# Patient Record
Sex: Female | Born: 1975 | Race: Black or African American | Hispanic: No | Smoking: Former smoker
Health system: Southern US, Community
[De-identification: ages and names within clinical notes are randomized; demographics above are authoritative.]

## PROBLEM LIST (undated history)

## (undated) ENCOUNTER — Inpatient Hospital Stay (HOSPITAL_COMMUNITY): Payer: Self-pay

## (undated) DIAGNOSIS — K219 Gastro-esophageal reflux disease without esophagitis: Secondary | ICD-10-CM

## (undated) DIAGNOSIS — O139 Gestational [pregnancy-induced] hypertension without significant proteinuria, unspecified trimester: Secondary | ICD-10-CM

## (undated) DIAGNOSIS — F329 Major depressive disorder, single episode, unspecified: Secondary | ICD-10-CM

## (undated) DIAGNOSIS — I10 Essential (primary) hypertension: Secondary | ICD-10-CM

## (undated) DIAGNOSIS — L309 Dermatitis, unspecified: Secondary | ICD-10-CM

## (undated) DIAGNOSIS — E119 Type 2 diabetes mellitus without complications: Secondary | ICD-10-CM

## (undated) DIAGNOSIS — F32A Depression, unspecified: Secondary | ICD-10-CM

## (undated) HISTORY — PX: NO PAST SURGERIES: SHX2092

---

## 2002-04-22 ENCOUNTER — Ambulatory Visit (HOSPITAL_COMMUNITY): Admission: RE | Admit: 2002-04-22 | Discharge: 2002-04-22 | Payer: Self-pay | Admitting: *Deleted

## 2002-07-26 ENCOUNTER — Inpatient Hospital Stay (HOSPITAL_COMMUNITY): Admission: AD | Admit: 2002-07-26 | Discharge: 2002-07-26 | Payer: Self-pay | Admitting: Obstetrics and Gynecology

## 2002-07-28 ENCOUNTER — Inpatient Hospital Stay (HOSPITAL_COMMUNITY): Admission: AD | Admit: 2002-07-28 | Discharge: 2002-07-31 | Payer: Self-pay | Admitting: *Deleted

## 2005-05-29 ENCOUNTER — Ambulatory Visit (HOSPITAL_COMMUNITY): Admission: RE | Admit: 2005-05-29 | Discharge: 2005-05-29 | Payer: Self-pay | Admitting: Obstetrics

## 2005-08-14 ENCOUNTER — Ambulatory Visit (HOSPITAL_COMMUNITY): Admission: RE | Admit: 2005-08-14 | Discharge: 2005-08-14 | Payer: Self-pay | Admitting: Obstetrics

## 2005-09-27 ENCOUNTER — Inpatient Hospital Stay (HOSPITAL_COMMUNITY): Admission: AD | Admit: 2005-09-27 | Discharge: 2005-09-27 | Payer: Self-pay | Admitting: Obstetrics

## 2005-10-01 ENCOUNTER — Inpatient Hospital Stay (HOSPITAL_COMMUNITY): Admission: AD | Admit: 2005-10-01 | Discharge: 2005-10-01 | Payer: Self-pay | Admitting: Obstetrics

## 2005-10-12 ENCOUNTER — Inpatient Hospital Stay (HOSPITAL_COMMUNITY): Admission: AD | Admit: 2005-10-12 | Discharge: 2005-10-14 | Payer: Self-pay | Admitting: Obstetrics & Gynecology

## 2005-10-16 ENCOUNTER — Inpatient Hospital Stay (HOSPITAL_COMMUNITY): Admission: AD | Admit: 2005-10-16 | Discharge: 2005-10-16 | Payer: Self-pay | Admitting: Obstetrics

## 2005-11-15 ENCOUNTER — Ambulatory Visit: Admission: RE | Admit: 2005-11-15 | Discharge: 2005-11-15 | Payer: Self-pay | Admitting: Obstetrics

## 2005-11-17 ENCOUNTER — Inpatient Hospital Stay (HOSPITAL_COMMUNITY): Admission: AD | Admit: 2005-11-17 | Discharge: 2005-11-17 | Payer: Self-pay | Admitting: Obstetrics

## 2006-12-23 ENCOUNTER — Emergency Department (HOSPITAL_COMMUNITY): Admission: EM | Admit: 2006-12-23 | Discharge: 2006-12-23 | Payer: Self-pay | Admitting: Emergency Medicine

## 2008-10-10 ENCOUNTER — Inpatient Hospital Stay (HOSPITAL_COMMUNITY): Admission: AD | Admit: 2008-10-10 | Discharge: 2008-10-10 | Payer: Self-pay | Admitting: Family Medicine

## 2010-09-11 LAB — URINALYSIS, ROUTINE W REFLEX MICROSCOPIC
Bilirubin Urine: NEGATIVE
Glucose, UA: NEGATIVE mg/dL
Hgb urine dipstick: NEGATIVE
Ketones, ur: NEGATIVE mg/dL
Nitrite: NEGATIVE
Protein, ur: NEGATIVE mg/dL
Specific Gravity, Urine: 1.015 (ref 1.005–1.030)
Urobilinogen, UA: 1 mg/dL (ref 0.0–1.0)
pH: 8 (ref 5.0–8.0)

## 2010-09-11 LAB — CBC
HCT: 38 % (ref 36.0–46.0)
Hemoglobin: 12.3 g/dL (ref 12.0–15.0)
RBC: 4.81 MIL/uL (ref 3.87–5.11)

## 2010-09-11 LAB — WET PREP, GENITAL
Clue Cells Wet Prep HPF POC: NONE SEEN
Trich, Wet Prep: NONE SEEN
Yeast Wet Prep HPF POC: NONE SEEN

## 2010-09-11 LAB — DIFFERENTIAL
Eosinophils Relative: 1 % (ref 0–5)
Lymphocytes Relative: 31 % (ref 12–46)
Lymphs Abs: 3 10*3/uL (ref 0.7–4.0)
Monocytes Absolute: 0.3 10*3/uL (ref 0.1–1.0)
Monocytes Relative: 3 % (ref 3–12)
Neutro Abs: 6.4 10*3/uL (ref 1.7–7.7)

## 2010-10-19 NOTE — H&P (Signed)
Whitney White, Whitney White                 ACCOUNT NO.:  [id number]   MEDICAL RECORD NO.:  [id number]          PATIENT TYPE:  INP   LOCATION:  9168                          FACILITY:  [facility]   PHYSICIAN:  Roseanna Rainbow, M.D.DATE OF BIRTH:  [birth year]   DATE OF ADMISSION:  10/12/2005  DATE OF DISCHARGE:                                HISTORY & PHYSICAL   CHIEF COMPLAINT:  The patient is a 35 year old para 2 with an intrauterine  pregnancy at 38-5/7 weeks, complaining of ruptured membranes, clear fluids  several hours prior to presentation.   HISTORY OF PRESENT ILLNESS:  Please see the above.   ALLERGIES:  No known drug allergies.   MEDICATIONS:  Prevacid, loratadine.   RISK FACTORS:  None.   LABORATORY DATA:  Blood type A positive, antibody screen negative.  RPR  nonreactive, rubella immune, hepatitis B surface antigen negative, GBS  negative.  HIV nonreactive.  Quad screen normal.  GC and Chlamydia negative.  1-hour GCT 128.  Elevated HSV 1 titers, HSV 2 titers negative, platelet  count 319,000.  Hemoglobin 11.2, hematocrit 35.7, sickle cell negative.   PAST OB/GYN HISTORY:  She is status post 2 spontaneous vaginal deliveries.   PAST MEDICAL HISTORY:  1.  Iron deficiency anemia.  2.  Asthma.  3.  Tension headaches.   PAST SURGICAL HISTORY:  No previous surgery.   SOCIAL HISTORY:  Homemaker.  She is divorced, living with a significant  other, does not give any significant history of alcohol usage, has no  significant smoking history, denies illicit drug use.   FAMILY HISTORY:  Hypertension.   PHYSICAL EXAMINATION:  VITAL SIGNS:  Stable, afebrile.  GENERAL:  Well-developed, well-nourished African-American female in no  apparent distress.  STERILE SPECULUM EXAM:  There is pooling, fern and Nitrazine positive.  STERILE VAGINAL EXAM PER THE RN:  Cervix is 3 and 50% effaced.  Fetal heart  tones reassuring, tocodynamometer irregular uterine contractions.   ASSESSMENT:  1.   Multiparous with intrauterine pregnancy at term.  2.  Prodromal labor.  3.  Fetal heart tracing consistent with fetal well-being.   PLAN:  1.  Admission.  2.  Low dose Pitocin per protocol.      Roseanna Rainbow, M.D.  Electronically Signed     LAJ/MEDQ  D:  10/12/2005  T:  10/12/2005  Job:  [id number]

## 2011-03-07 ENCOUNTER — Other Ambulatory Visit: Payer: Self-pay

## 2011-03-07 ENCOUNTER — Inpatient Hospital Stay (HOSPITAL_COMMUNITY): Payer: Medicaid Other

## 2011-03-07 ENCOUNTER — Encounter (HOSPITAL_COMMUNITY): Payer: Self-pay

## 2011-03-07 ENCOUNTER — Inpatient Hospital Stay (HOSPITAL_COMMUNITY)
Admission: AD | Admit: 2011-03-07 | Discharge: 2011-03-07 | Disposition: A | Discharge status: Home / Self Care | Payer: Medicaid Other | Source: Ambulatory Visit | Attending: Obstetrics & Gynecology | Admitting: Obstetrics & Gynecology

## 2011-03-07 DIAGNOSIS — R079 Chest pain, unspecified: Secondary | ICD-10-CM

## 2011-03-07 DIAGNOSIS — Z331 Pregnant state, incidental: Secondary | ICD-10-CM

## 2011-03-07 DIAGNOSIS — O99891 Other specified diseases and conditions complicating pregnancy: Secondary | ICD-10-CM | POA: Insufficient documentation

## 2011-03-07 LAB — URINE MICROSCOPIC-ADD ON

## 2011-03-07 LAB — URINALYSIS, ROUTINE W REFLEX MICROSCOPIC
Glucose, UA: NEGATIVE mg/dL
Ketones, ur: NEGATIVE mg/dL
Leukocytes, UA: NEGATIVE
Nitrite: NEGATIVE
Protein, ur: NEGATIVE mg/dL
Urobilinogen, UA: 0.2 mg/dL (ref 0.0–1.0)

## 2011-03-07 MED ORDER — IOHEXOL 300 MG/ML  SOLN
100.0000 mL | Freq: Once | INTRAMUSCULAR | Status: AC | PRN
  Administered 2011-03-07: 100 mL via INTRAVENOUS

## 2011-03-07 NOTE — Progress Notes (Signed)
Pt states she has had upper left chest pain on and off for a couple of weeks during the day. Started to be constant 10-3 and continues today.

## 2011-03-07 NOTE — ED Provider Notes (Signed)
History     Chief Complaint  Patient presents with  . Chest Pain   HPIShanni V Blue35 y.o.presents with a 10 day history of upper left chest/shoulder pain. She is now 92w2days pregnant.  G4 P3.Has copy of +UPT at [facility] done 02/07/11.  lans care with Dr. Clearance Coots. States this pregnancy is "more difficult"  Than her other 3.  Discomfort has been  off and on until yesterday when it became more constant.  States pain is worse after getting her 3 children to bus stop.  This makes her short of breath.  She denies difficulty breathing, denies dx of asthma.  Has hx of heartburn but this does not feel like it.  "I feel like I am under water".  Used her boyfriend's inhaler which helped.  States her son is "developing" asthma and she has hx of seasonal allergies.  Denies vaginal bleeding/discharge and abdominal pain.     No past medical history on file.  No past surgical history on file.  No family history on file.  History  Substance Use Topics  . Smoking status: Not on file  . Smokeless tobacco: Not on file  . Alcohol Use: Not on file    Allergies: No Known Allergies  Prescriptions prior to admission  Medication Sig Dispense Refill  . prenatal vitamin w/FE, FA (PRENATAL 1 + 1) 27-1 MG TABS Take 1 tablet by mouth daily.          Review of Systems  Constitutional: Positive for chills (at night).  Respiratory: Positive for shortness of breath. Negative for cough and wheezing.   Gastrointestinal: Positive for heartburn, nausea and vomiting. Negative for abdominal pain.  Genitourinary: Positive for frequency. Negative for dysuria, urgency and hematuria.   Physical Exam   Blood pressure 121/83, pulse 91, temperature 99 F (37.2 C), temperature source Oral, resp. rate 20, height 5\' 2"  (1.575 m), weight 230 lb 3.2 oz (104.418 kg), last menstrual period 12/25/2010, SpO2 99.00%.  Physical Exam  Constitutional: She is oriented to person, place, and time. She appears well-developed and  well-nourished. No distress.  Respiratory: Effort normal and breath sounds normal. No respiratory distress. She has no wheezes. She has no rales. She exhibits no tenderness.  Neurological: She is alert and oriented to person, place, and time.  Skin: Skin is warm and dry.    Status:  Final result                     Study Result     *RADIOLOGY REPORT*  Clinical Data: 10-week pregnant obese female with left chest pain  shortness of breath  CT ANGIOGRAPHY CHEST WITH CONTRAST  Technique: Multidetector CT imaging of the chest was performed  using the standard protocol during bolus administration of  intravenous contrast. Multiplanar CT image reconstructions  including MIPs were obtained to evaluate the vascular anatomy.  Contrast: 100 ml Omnipaque-300  Comparison: None available  Findings: Central proximal hilar pulmonary arteries are well  visualized. No significant filling defect or pulmonary embolus  identified by CTA. Residual thymic tissue in the anterior  mediastinum. Pulsation artifact of the thoracic aorta. Negative  for aneurysm or dissection. Normal heart size. No pericardial or  pleural effusion. No hiatal hernia.  Upper abdominal imaging demonstrates no acute process.  No adenopathy.  Lung windows demonstrate clear lungs. No focal pneumonia. Minor  scattered lingula and basilar atelectasis. Negative for collapse,  consolidation, edema, or interstitial process. Trachea and central  airways are patent.  Review of  the MIP images confirms the above findings.  IMPRESSION:  Negative for significant acute pulmonary embolus by CTA.  Minor scattered atelectasis.  No acute intrathoracic process.  Original Report Authenticated By: Judie Petit. Ruel Favors, M.D.      Imaging     CT Angio Chest W/Cm &amp;/Or Wo Cm (Order #[id number]) on 03/07/2011 - Imaging Information         Imaging     Imaging Information            Signed by       Signed  Date/Time    Phone   Pager    Berdine Dance T.  03/07/2011  11:42 AM EDT  [phone]  [phone]          Exam Information       Status  Exam Begun       Results for orders placed during the hospital encounter of 03/07/11 (from the past 24 hour(s))  URINALYSIS, ROUTINE W REFLEX MICROSCOPIC     Status: Abnormal   Collection Time   03/07/11 10:11 AM      Component Value Range   Color, Urine YELLOW  YELLOW    Appearance CLEAR  CLEAR    Specific Gravity, Urine 1.020  1.005 - 1.030    pH 7.5  5.0 - 8.0    Glucose, UA NEGATIVE  NEGATIVE (mg/dL)   Hgb urine dipstick SMALL (*) NEGATIVE    Bilirubin Urine NEGATIVE  NEGATIVE    Ketones, ur NEGATIVE  NEGATIVE (mg/dL)   Protein, ur NEGATIVE  NEGATIVE (mg/dL)   Urobilinogen, UA 0.2  0.0 - 1.0 (mg/dL)   Nitrite NEGATIVE  NEGATIVE    Leukocytes, UA NEGATIVE  NEGATIVE   URINE MICROSCOPIC-ADD ON     Status: Normal   Collection Time   03/07/11 10:11 AM      Component Value Range   Squamous Epithelial / LPF RARE  RARE    WBC, UA 0-2  <3 (WBC/hpf)   RBC / HPF 0-2  <3 (RBC/hpf)   MAU Course  Procedures   EKG__  Normal Sinus rhythm, Nonspecific T wave abnormality, abnormal ECG.   MDM Discussed patient's hx, sxs, physical exam and ECG results with Dr. Marice Potter.  Order given for spiral CT. 11:55 reported results to Dr. Marice Potter.  May be discharge to home with Motrin short term.  If sxs worsen return for further evaluation.  Call her doctor to begin prenatal care.  Assessment and Plan  A:  Chest pain--negative Spiral CT      [redacted]w[redacted]d pregnant  P:  May take Motrin short term prn for pain      Return if sxs worsen      Begin prenatal care  KEY,EVE M 03/07/2011, 9:41 AM   Matt Holmes, NP 03/07/11 1204

## 2011-03-07 NOTE — Discharge Instructions (Signed)
ABC's of Pregnancy A Antepartum care is very important. Be sure you see your doctor and get prenatal care as soon as you think you are pregnant. At this time, you will be tested for infection, genetic abnormalities and potential problems with you and the pregnancy. This is the time to discuss diet, exercise, work, medications, labor, pain medication during labor and the possibility of a Cesarean delivery. Ask any questions that may concern you. It is important to see your doctor regularly throughout your pregnancy. Avoid exposure to toxic substances and chemicals - such as cleaning solvents, lead and mercury, some insecticides, and paint. Pregnant women should avoid exposure to paint fumes, and fumes that cause you to feel ill, dizzy or faint. When possible, it is a good idea to have a pre-pregnancy consultation with your caregiver to begin some important recommendations your caregiver suggests such as, taking folic acid, exercising, quitting smoking, avoiding alcoholic beverages, etc. B Breastfeeding is the healthiest choice for both you and your baby. It has many nutritional benefits for the baby and health benefits for the mother. It also creates a very tight and loving bond between the baby and mother. Talk to your doctor, your family and friends, and your employer about how you choose to feed your baby and how they can support you in your decision. Not all Birth defects can be prevented, but a woman can take actions that may increase her chance of having a healthy baby. Many birth defects happen very early in pregnancy, sometimes before a woman even knows she is pregnant. Birth defects or abnormalities of any child in your or the father's family should be discussed with your caregiver. Get a good support Bra as your Breast size changes. Wear it especially when you exercise and when nursing.  C Celebrate the news of your pregnancy with the your spouse/father and family. Childbirth classes are helpful to take  for you and the spouse/father because it helps to understand what happens during the pregnancy, labor and delivery. Cesarean delivery should be discussed with your doctor so you are prepared for that possibility. The pros and cons of Circumcision if it is a boy, should be discussed with your pediatrician. Cigarette smoking during pregnancy can result in low birth weight babies. It has been associated with infertility, miscarriages, tubal pregnancies, infant death (mortality) and poor health (morbidity) in childhood. Additionally, cigarette smoking may cause long-term learning disabilities. If you smoke, you should try to quit before getting pregnant and not smoke during the pregnancy. Secondary smoke may also harm a mother and her developing baby. It is a good idea to ask people to stop smoking around you during your pregnancy and after the baby is born. Extra Calcium is necessary when you are pregnant and is found in your prenatal vitamin, in dairy products, green leafy vegetables and in calcium supplements. D A healthy Diet according to your current weight and height, along with vitamins and mineral supplements should be discussed with your caregiver. Domestic abuse and/or violence should be made known to your doctor right away to get the situation corrected. Drink more water when you exercise to keep hydrated. Discomfort of your back and legs usually develops and progresses from the middle of the second trimester through to delivery of the baby. This is because of the enlarging baby and uterus, which may also affect your balance. Do not take illegal drugs. Illegal drugs can seriously harm the baby and you. Drink extra fluids (water is best) throughout pregnancy to help  your body keep up with the increases in your blood volume. Drink at least 6 to 8 glasses of water, fruit juice, or milk each day. A good way to know you are drinking enough fluid is when your urine looks almost like clear water or is very light  yellow.  E Eat healthy to get the nutrients you and your unborn baby need. Your meals should include the five basic food groups. Exercise (30 minutes of light to moderate exercise a day) is important and encouraged during pregnancy, if there are no medical problems or problems with the pregnancy. Exercise that causes discomfort or dizziness should be stopped and reported to your caregiver. Emotions during pregnancy can change from being ecstatic to depression and should be understood by you, your partner and your family. F Fetal screening with ultrasound, amniocentesis and monitoring during pregnancy and labor is common and sometimes necessary. Take 400 micrograms of Folic acid daily both before, when possible, and during the first few months of pregnancy to reduce the risk of birth defects of the brain and spine. All women who could possibly become pregnant should take a vitamin with folic acid, every day. It is also important to eat a healthy diet with fortified foods (enriched grain products, including cereals, rice, breads, and pastas) and foods with natural sources of folate (orange juice, green leafy vegetables, beans, peanuts, broccoli, asparagus, peas, and lentils). The Father should be involved with all aspects of the pregnancy including, the prenatal care, childbirth classes, labor, delivery and postpartum time. Fathers may also have emotional concerns about being a father, financial needs and raising a family. G Genetic testing should be done appropriately. It is important to know your family and the father's history. If there have been problems with pregnancies or birth defects in your family, report these to your doctor. Also, genetic counselors can talk with you about the information you might need in making decisions about having a family. You can call a major medical center in your area for help in finding a board-certified genetic counselor. Genetic testing and counseling should be done before  pregnancy when possible, especially if there is a history of problems in the mother's or father's family. Certain ethnic backgrounds are more at risk for genetic defects. H Get familiar with the Hospital where you will be having your baby. Get to know how long it takes to get there, the labor and delivery area, and the hospital procedures. Be sure your medical insurance is accepted there. Get your Home ready for the baby including, clothes, the baby's room (when possible), furniture and car seat. Hand-washing is important throughout the day, especially after handling raw meat and poultry, changing the baby's diaper or using the bathroom. This can help prevent the spread of many germs (bacteria) and viruses that cause infection. Your Hair may become dry and thinner, but will return to normal a few weeks after the baby is born. Heartburn is a common problem that can be treated by taking antacids recommended by your caregiver, eating smaller meals 5 or 6 times a day, not drinking liquids when eating, drinking between meals and raising the head of you bed 2 to 3 inches. I Insurance to cover you, the baby, doctor and hospital should be reviewed so that you will be prepared to pay any costs not covered by your insurance plan. If you do not have medical insurance, there are usually clinics and services available for you in your community. Take 30 milligrams of Iron during  your pregnancy as prescribed by your doctor to reduce the risk of  low red blood cells (anemia) later in pregnancy. All women of childbearing age should eat a diet rich in iron. J There should be a Joint effort for the mother, father and any other children to adapt to the pregnancy financially, emotionally and psychologically during the pregnancy. Join a support group for moms-to-be. Or, join a class on parenting or childbirth. Have the family participate when possible. K Know your limits. Let your caregiver know if you experience any of the  following:   Pain of any kind.  Strong cramps.   You develop a lot of weight in a short period of time (5 pounds in 3 to 5 days).   Vaginal bleeding, leaking of amniotic fluid.   Headache, vision problems.   Dizziness, fainting, shortness of breath.   Chest pain.   Fever of 100.5 or higher.   Gush of clear fluid from your vagina.   Painful urination.   Domestic violence.   Irregular heartbeat (palpitations).  Rapid beating of the heart (tachycardia).   Constant feeling sick to your stomach (nauseous) and vomiting.   Trouble walking, fluid retention (edema).   Muscle weakness.   If your baby has decreased activity.   Persistent diarrhea.   Abnormal vaginal discharge.   Uterine contractions at 20-minute intervals.   Back pain that travels down your leg.   L Learn and practice that what you eat and drink should be in moderation and healthy for you and your baby. Legal drugs such as alcohol and caffeine are important issues for pregnant women. There is no safe amount of alcohol a woman can drink while pregnant. Fetal alcohol syndrome, a disorder characterized by growth retardation, facial abnormalities, and central nervous system dysfunction, is caused by a woman's use of alcohol during pregnancy. Caffeine, found in tea, coffee, soft drinks and chocolate, should also be limited. Be sure to read labels when trying to cut down on caffeine during pregnancy. More than 200 foods, beverages, and over-the-counter medications contain caffeine and have a high salt content! There are coffees and teas that do not contain caffeine.  M Medical conditions such as diabetes, epilepsy, and high blood pressure should be treated and kept under control before pregnancy when possible, but especially during pregnancy. Ask your caregiver about any medications that may need to be changed or adjusted during pregnancy. If you are currently taking any medications, ask your caregiver if it is safe to  take them while you are pregnant or before getting pregnant when possible. Also, be sure to discuss any herbs or vitamins you are taking. They are medicines, too! Discuss with your doctor all medications, prescribed and over-the-counter, that you are taking. During your prenatal visit, discuss the medications your doctor may give you during labor and delivery. N Never be afraid to ask your doctor or caregiver questions about your health, the progress of the pregnancy, family problems, stressful situations, and recommendation for a pediatrician, if you do not have one. It is better to take all precautions and discuss any questions or concerns you may have during your office visits. It is a good idea to write down your questions before you visit the doctor. O Over-the-counter cough and cold remedies may contain alcohol or other ingredients that should be avoided during pregnancy. Ask your caregiver about prescription, herbs or over-the-counter medications that you are taking or may consider taking while pregnant.  P Physical activity during pregnancy can benefit both  you and your baby by lessening discomfort and fatigue, providing a sense of well-being, and increasing the likelihood of early recovery after delivery. Light to moderate exercise during pregnancy strengthens the belly (abdominal) and back muscles. This helps improve posture. Practicing yoga, walking, swimming, and cycling on a stationary bicycle are usually safe exercises for pregnant women. Avoid scuba diving, exercise at high altitudes (over 3000 feet), skiing, horseback riding, contact sports, etc. Always check with your doctor before beginning any kind of exercise, especially during pregnancy and especially if you did not exercise before getting pregnant. Q Queasiness, stomach upset and morning sickness are common during pregnancy. Eating a couple of crackers or dry toast before getting out of bed. Foods that you normally love may make you feel  sick to your stomach. You may need to substitute other nutritious foods. Eating 5 or 6 small meals a day instead of 3 large ones may make you feel better. Do not drink with your meals, drink between meals. Questions that you have should be written down and asked during your prenatal visits. R Read about and make plans to baby-proof your home. There are important tips for making your home a safer environment for your baby. Review the tips and make your home safer for you and your baby. Read food labels regarding calories, salt and fat content in the food. S Saunas, hot tubs, and steam rooms should be avoided while you are pregnant. Excessive high heat may be harmful during your pregnancy. Your caregiver will screen and examine you for sexually transmitted diseases and genetic disorders during your prenatal visits. Learn the Signs of labor. Sexual relations while pregnant is safe unless there is a medical or pregnancy problem and your caregiver advises against it. T Traveling long distances should be avoided especially in the third trimester of your pregnancy. If you do have to travel out of state, be sure to take a copy of your medical records and medical insurance plan with you. You should not travel long distances without seeing your doctor first. Most airlines will not allow you to travel after 36 weeks of pregnancy. Toxoplasmosis is an infection caused by a parasite that can seriously harm an unborn baby. Avoid eating undercooked meat and handling cat litter. Be sure to wear gloves when gardening. Tingling of the hands and fingers is not unusual and is due to fluid retention. This will go away after the baby is born. U Womb (uterus) size increases during the first trimester. Your kidneys will begin to function more efficiently. This may cause you to feel the need to Urinate more often. You may also leak urine when sneezing, coughing or laughing. This is due to the growing uterus pressing against your bladder,  which lies directly in front of and slightly under the uterus during the first few months of pregnancy. If you experience burning along with frequency of urination or bloody urine, be sure to tell your doctor. The size of your uterus in the third trimester may cause a problem with your balance. It is advisable to maintain good posture and avoid wearing high heels during this time. An Ultrasound of your baby may be necessary during your pregnancy and is safe for you and your baby. V Vaccinations are an important concern for pregnant women. Get needed vaccines before pregnancy. Center for Disease Control ([employer]) has clear guidelines for the use of vaccines during pregnancy. Review the list, be sure to discuss it with your doctor. Prenatal Vitamins are helpful and healthy  for you and the baby. Do not take extra vitamins except what is recommended. Taking too much of certain vitamins can cause overdose problems. Continuous Vomiting should be reported to your caregiver. Varicose veins may appear especially if there is a family history of varicose veins. They should subside after the delivery of the baby. Support hose helps if there is leg discomfort. W Being overweight or underweight during pregnancy may cause problems. Try to get within 15 pounds of your ideal Weight before pregnancy. Remember, pregnancy is not a time to be dieting! Do not stop eating or start skipping meals as your weight increases. Both you and your baby need the calories and nutrition you receive from a healthy diet. Be sure to consult with your doctor about your diet. There is a formula and diet plan available depending on whether you are overweight or underweight. Your caregiver or nutritionist can help and advise you if necessary. X Avoid X-rays. If you must have dental work or diagnostic tests, tell your dentist or physician that you are pregnant so that extra care can be taken. X-rays should only be taken when the risks of not taking  them outweigh the risk of taking them. If needed, only the minimum amount of radiation should be used. When X-rays are necessary, protective lead shields should be used to cover areas of the body that are not being X-rayed. Y Your baby loves you. Breastfeeding your baby creates a loving and very close bond between the two of you. Give your baby a healthy environment to live in while you are pregnant. Infants and children require constant care and guidance. Their health and safety should be carefully watched at all times. After the baby is born, rest or take a nap when the baby is sleeping. Z Get your [phone]. Be sure to get plenty of rest. Resting on your side as often as possible, especially on your left side is advised. It provides the best circulation to your baby and helps reduce swelling. Try taking a nap for thirty to forty five minutes in the afternoon when possible. After the baby is born rest or take a nap when the baby is sleeping. Try elevating your feet for that amount of time when possible. It helps the circulation in your legs and helps reduce swelling.  Most information courtesy of the CDC. Document Released: 05/20/2005 Document Re-Released: 08/14/2009 [facility] Patient Information 2011 [place], [place].Chest Pain (Nonspecific) Chest pain has many causes. Your pain could be caused by something serious, such as a heart attack or a blood clot in the lungs. It could also be caused by something less serious, such as a chest bruise or a virus. Follow up with your doctor. More lab tests or other studies may be needed to find the cause of your pain. Most of the time, nonspecific chest pain will improve within 2 to 3 days of rest and mild pain medicine.  HOME CARE  For chest bruises, you may put ice on the sore area for  minutes,  times a day. Do this only if it makes you or your child feel better.   Put ice in a plastic bag.   Place a towel between the skin and the bag.   Rest for the next 2 to  3 days.   You can go back to work if the pain improves.   See your doctor if the pain lasts longer than 1 to 2 weeks.   Only use medicine for pain as told by  your doctor.   If you smoke, stop.  GET HELP RIGHT AWAY IF:  There is more pain or pain that spreads to the arm, neck, jaw, back, or belly (abdomen).   You or your child has shortness of breath.   You or your child coughs more than usual or coughs up blood.   You or your child has very bad back or belly pain, feels sick to his or her stomach (nauseous), or throws up (vomits).   You or your child has very bad weakness.   You or your child passes out (faints).   You or your child has a temperature by mouth above 100.5, not controlled by medicine.  MAKE SURE YOU:   Understand these instructions.   Will watch this condition.   Will get help right away if you or your child is not doing well or gets worse.  Document Released: 11/06/2007 Document Re-Released: 11/07/2009 [facility] Patient Information 2011 [place], [place].

## 2011-03-18 LAB — POCT RAPID STREP A: Streptococcus, Group A Screen (Direct): NEGATIVE

## 2011-05-07 ENCOUNTER — Other Ambulatory Visit: Payer: Self-pay | Admitting: Obstetrics

## 2011-05-07 ENCOUNTER — Other Ambulatory Visit: Payer: Self-pay

## 2011-05-07 LAB — HEPATITIS B SURFACE ANTIGEN: Hepatitis B Surface Ag: NEGATIVE

## 2011-05-07 LAB — ANTIBODY SCREEN: Antibody Screen: NEGATIVE

## 2011-05-07 LAB — HIV ANTIBODY (ROUTINE TESTING W REFLEX): HIV: NONREACTIVE

## 2011-05-09 LAB — GC/CHLAMYDIA PROBE AMP, GENITAL: Chlamydia: NEGATIVE

## 2011-05-15 ENCOUNTER — Encounter: Payer: Self-pay | Admitting: Obstetrics & Gynecology

## 2011-06-04 NOTE — L&D Delivery Note (Signed)
Delivery Note At 4:47 AM a viable female was delivered via Vaginal, Spontaneous Delivery (Presentation: Right Occiput Posterior).  APGAR: 8, 9; weight 8 lb 6 oz (3799 g).   Placenta status: Intact, Spontaneous.  Cord: 3 vessels with the following complications: None.  Cord pH: none  Anesthesia: Epidural  Episiotomy: None Lacerations: None Suture Repair: none Est. Blood Loss (mL): 350  Mom to postpartum.  Baby to nursery-stable.  Binnie Vonderhaar A 09/18/2011, 5:13 AM

## 2011-06-26 ENCOUNTER — Other Ambulatory Visit: Payer: Self-pay | Admitting: Obstetrics

## 2011-06-26 ENCOUNTER — Encounter: Payer: Self-pay | Admitting: Obstetrics

## 2011-07-03 ENCOUNTER — Ambulatory Visit (HOSPITAL_COMMUNITY)
Admission: RE | Admit: 2011-07-03 | Discharge: 2011-07-03 | Disposition: A | Discharge status: Home / Self Care | Payer: Medicaid Other | Source: Ambulatory Visit | Attending: Obstetrics | Admitting: Obstetrics

## 2011-07-03 DIAGNOSIS — O409XX0 Polyhydramnios, unspecified trimester, not applicable or unspecified: Secondary | ICD-10-CM | POA: Insufficient documentation

## 2011-07-03 DIAGNOSIS — O358XX0 Maternal care for other (suspected) fetal abnormality and damage, not applicable or unspecified: Secondary | ICD-10-CM | POA: Insufficient documentation

## 2011-07-03 DIAGNOSIS — Z363 Encounter for antenatal screening for malformations: Secondary | ICD-10-CM | POA: Insufficient documentation

## 2011-07-03 DIAGNOSIS — Z1389 Encounter for screening for other disorder: Secondary | ICD-10-CM | POA: Insufficient documentation

## 2011-07-03 NOTE — Progress Notes (Signed)
Obstetric ultrasound performed today.  Please see report in [facility]. 

## 2011-07-05 ENCOUNTER — Other Ambulatory Visit: Payer: Self-pay | Admitting: Obstetrics

## 2011-07-05 LAB — US OB FOLLOW UP

## 2011-07-05 LAB — US OB DETAIL + 14 WK

## 2011-07-24 ENCOUNTER — Ambulatory Visit (HOSPITAL_COMMUNITY): Payer: Medicaid Other

## 2011-07-26 ENCOUNTER — Ambulatory Visit (HOSPITAL_COMMUNITY)
Admission: RE | Admit: 2011-07-26 | Discharge: 2011-07-26 | Disposition: A | Discharge status: Home / Self Care | Payer: Medicaid Other | Source: Ambulatory Visit | Attending: Obstetrics | Admitting: Obstetrics

## 2011-07-26 DIAGNOSIS — O99210 Obesity complicating pregnancy, unspecified trimester: Secondary | ICD-10-CM

## 2011-07-26 DIAGNOSIS — O409XX0 Polyhydramnios, unspecified trimester, not applicable or unspecified: Secondary | ICD-10-CM | POA: Insufficient documentation

## 2011-07-26 DIAGNOSIS — Z3689 Encounter for other specified antenatal screening: Secondary | ICD-10-CM | POA: Insufficient documentation

## 2011-07-26 NOTE — Progress Notes (Signed)
Whitney White was seen for ultrasound appointment today.  Please see [facility] report for details.  

## 2011-08-23 ENCOUNTER — Ambulatory Visit (HOSPITAL_COMMUNITY)
Admission: RE | Admit: 2011-08-23 | Discharge: 2011-08-23 | Disposition: A | Discharge status: Home / Self Care | Payer: Medicaid Other | Source: Ambulatory Visit | Attending: Obstetrics | Admitting: Obstetrics

## 2011-08-23 DIAGNOSIS — O409XX0 Polyhydramnios, unspecified trimester, not applicable or unspecified: Secondary | ICD-10-CM | POA: Insufficient documentation

## 2011-08-23 DIAGNOSIS — Z3689 Encounter for other specified antenatal screening: Secondary | ICD-10-CM | POA: Insufficient documentation

## 2011-08-23 DIAGNOSIS — O99210 Obesity complicating pregnancy, unspecified trimester: Secondary | ICD-10-CM

## 2011-08-23 LAB — GLUCOSE, CAPILLARY: Glucose-Capillary: 77 mg/dL (ref 70–99)

## 2011-08-23 NOTE — ED Notes (Signed)
Spot check glucose = 77.

## 2011-08-28 ENCOUNTER — Encounter (HOSPITAL_COMMUNITY): Payer: Self-pay | Admitting: *Deleted

## 2011-08-28 ENCOUNTER — Inpatient Hospital Stay (HOSPITAL_COMMUNITY)
Admission: AD | Admit: 2011-08-28 | Discharge: 2011-08-28 | Disposition: A | Discharge status: Home / Self Care | Payer: Medicaid Other | Source: Ambulatory Visit | Attending: Obstetrics & Gynecology | Admitting: Obstetrics & Gynecology

## 2011-08-28 DIAGNOSIS — O4700 False labor before 37 completed weeks of gestation, unspecified trimester: Secondary | ICD-10-CM | POA: Insufficient documentation

## 2011-08-28 DIAGNOSIS — IMO0002 Reserved for concepts with insufficient information to code with codable children: Secondary | ICD-10-CM | POA: Insufficient documentation

## 2011-08-28 DIAGNOSIS — M25473 Effusion, unspecified ankle: Secondary | ICD-10-CM

## 2011-08-28 DIAGNOSIS — R609 Edema, unspecified: Secondary | ICD-10-CM

## 2011-08-28 LAB — URINALYSIS, ROUTINE W REFLEX MICROSCOPIC
Bilirubin Urine: NEGATIVE
Glucose, UA: NEGATIVE mg/dL
Hgb urine dipstick: NEGATIVE
Protein, ur: NEGATIVE mg/dL
Specific Gravity, Urine: 1.01 (ref 1.005–1.030)

## 2011-08-28 LAB — POCT FERN TEST: Fern Test: NEGATIVE

## 2011-08-28 LAB — URINE MICROSCOPIC-ADD ON

## 2011-08-28 NOTE — Discharge Instructions (Signed)
Follow up in the office as scheduled Call your doctor if your symptoms worsen. Drink at least 8 8-oz glasses of water every day.

## 2011-08-28 NOTE — MAU Provider Note (Signed)
History     CSN: [id number]  Arrival date and time: 08/28/11 4098   First Provider Initiated Contact with Patient 08/28/11 [phone]      Chief Complaint  Patient presents with  . Labor Eval  . Leg Swelling   HPI Whitney White 35 y.o. 35w 1d gestation.  Comes to MAU with irregular contractions and leaking of fluid vs leaking of urine.  Has very swollen feet and ankles.  OB History    Grav Para Term Preterm Abortions TAB SAB Ect Mult Living   4 3 3  0 0 0 0 0 0 3      Past Medical History  Diagnosis Date  . No pertinent past medical history     Past Surgical History  Procedure Date  . No past surgeries     Family History  Problem Relation Age of Onset  . Anesthesia problems Neg Hx     History  Substance Use Topics  . Smoking status: Former Games developer  . Smokeless tobacco: Not on file  . Alcohol Use: No    Allergies: No Known Allergies  Prescriptions prior to admission  Medication Sig Dispense Refill  . Cholecalciferol (VITAMIN D PO) Take 1 tablet by mouth every morning.      Marland Kitchen omeprazole (PRILOSEC) 20 MG capsule Take 20 mg by mouth 2 (two) times daily.      . Prenatal Vit-Fe Fumarate-FA (PRENATAL MULTIVITAMIN) TABS Take 1 tablet by mouth every morning.      . zolpidem (AMBIEN) 10 MG tablet Take 10 mg by mouth at bedtime. Picked up and have not started yet        Review of Systems  Gastrointestinal: Negative for nausea, vomiting and abdominal pain.  Genitourinary: Negative for dysuria.       Contractions Leaking of fluid   Physical Exam   Blood pressure 132/78, pulse 92, temperature 98.1 F (36.7 C), temperature source Oral, resp. rate 20, height 5' (1.524 m), weight 263 lb (119.296 kg), last menstrual period 12/25/2010.  Physical Exam  Nursing note and vitals reviewed. Constitutional: She is oriented to person, place, and time. She appears well-developed and well-nourished.  HENT:  Head: Normocephalic.  Eyes: EOM are normal.  Neck: Neck supple.  GI:  Soft. There is no tenderness.       FHT baseline 135 on monitor Strip is reactive - 15 x 15 accels noted with movement Irregular contractions  Genitourinary:       Speculum exam: Vulva - fluid noted on upper thighs Vagina - Small amount of creamy discharge, no odor, no pooling, no leaking with valsalva Cervix - No contact bleeding Bimanual exam: Cervix 1 cm, thick, vtx -2 Amnisure, fern slide done Chaperone present for exam.  Musculoskeletal: Normal range of motion.       3-4 +edema in ankles and feet bilaterally  Neurological: She is alert and oriented to person, place, and time.  Skin: Skin is warm and dry.  Psychiatric: She has a normal mood and affect.    MAU Course  Procedures Results for orders placed during the hospital encounter of 08/28/11 (from the past 24 hour(s))  URINALYSIS, ROUTINE W REFLEX MICROSCOPIC     Status: Abnormal   Collection Time   08/28/11  9:15 AM      Component Value Range   Color, Urine YELLOW  YELLOW    APPearance CLOUDY (*) CLEAR    Specific Gravity, Urine 1.010  1.005 - 1.030    pH 7.0  5.0 - 8.0  Glucose, UA NEGATIVE  NEGATIVE (mg/dL)   Hgb urine dipstick NEGATIVE  NEGATIVE    Bilirubin Urine NEGATIVE  NEGATIVE    Ketones, ur NEGATIVE  NEGATIVE (mg/dL)   Protein, ur NEGATIVE  NEGATIVE (mg/dL)   Urobilinogen, UA 0.2  0.0 - 1.0 (mg/dL)   Nitrite NEGATIVE  NEGATIVE    Leukocytes, UA SMALL (*) NEGATIVE   URINE MICROSCOPIC-ADD ON     Status: Abnormal   Collection Time   08/28/11  9:15 AM      Component Value Range   Squamous Epithelial / LPF MANY (*) RARE    WBC, UA 3-6  <3 (WBC/hpf)   Bacteria, UA RARE  RARE   AMNISURE RUPTURE OF MEMBRANE (ROM)     Status: Normal   Collection Time   08/28/11  9:55 AM      Component Value Range   Amnisure ROM NEGATIVE    POCT FERN TEST     Status: Normal   Collection Time   08/28/11 10:02 AM      Component Value Range   Fern Test Negative     MDM 1025 Consult with Dr. Tamela Oddi re: plan of  care  Assessment and Plan  35w 1d gestation No ROM Dependent edema  Plan Follow up in the office as scheduled Call your doctor if your symptoms worsen. Drink at least 8 8-oz glasses of water every day.   Whitney White 08/28/2011, 10:25 AM

## 2011-08-28 NOTE — MAU Note (Signed)
Pains started during the night, on going, irreg.   C/o increased swelling in feet, denies HA, epigastric pain or visual changes.

## 2011-08-28 NOTE — MAU Note (Signed)
Pt in c/o increased swelling in feet since yesterday, reports pain in feet.  Irregular ucs since last night.  Denies any ha, dizziness, blurred vision, or epigastric pain.  Reports multiple episodes of wet underwear since last night, denies any gushes, believes it might just be urine.  Denies any bleeding.  + FM.

## 2011-09-13 ENCOUNTER — Encounter (HOSPITAL_COMMUNITY): Payer: Self-pay | Admitting: *Deleted

## 2011-09-13 ENCOUNTER — Inpatient Hospital Stay (HOSPITAL_COMMUNITY)
Admission: AD | Admit: 2011-09-13 | Discharge: 2011-09-13 | Disposition: A | Discharge status: Home / Self Care | Payer: Medicaid Other | Source: Ambulatory Visit | Attending: Obstetrics | Admitting: Obstetrics

## 2011-09-13 ENCOUNTER — Ambulatory Visit (HOSPITAL_COMMUNITY)
Admission: RE | Admit: 2011-09-13 | Discharge: 2011-09-13 | Disposition: A | Discharge status: Home / Self Care | Payer: Medicaid Other | Source: Ambulatory Visit | Attending: Obstetrics | Admitting: Obstetrics

## 2011-09-13 DIAGNOSIS — O409XX0 Polyhydramnios, unspecified trimester, not applicable or unspecified: Secondary | ICD-10-CM | POA: Insufficient documentation

## 2011-09-13 DIAGNOSIS — O139 Gestational [pregnancy-induced] hypertension without significant proteinuria, unspecified trimester: Secondary | ICD-10-CM | POA: Insufficient documentation

## 2011-09-13 DIAGNOSIS — Z3689 Encounter for other specified antenatal screening: Secondary | ICD-10-CM | POA: Insufficient documentation

## 2011-09-13 DIAGNOSIS — O99210 Obesity complicating pregnancy, unspecified trimester: Secondary | ICD-10-CM

## 2011-09-13 DIAGNOSIS — IMO0002 Reserved for concepts with insufficient information to code with codable children: Secondary | ICD-10-CM

## 2011-09-13 HISTORY — DX: Essential (primary) hypertension: I10

## 2011-09-13 HISTORY — DX: Dermatitis, unspecified: L30.9

## 2011-09-13 LAB — COMPREHENSIVE METABOLIC PANEL
ALT: 16 U/L (ref 0–35)
AST: 20 U/L (ref 0–37)
Albumin: 2.9 g/dL — ABNORMAL LOW (ref 3.5–5.2)
Calcium: 9.4 mg/dL (ref 8.4–10.5)
GFR calc Af Amer: >90 mL/min (ref >90)
Glucose, Bld: 96 mg/dL (ref 70–99)
Sodium: 135 mEq/L (ref 135–145)
Total Protein: 7.1 g/dL (ref 6.0–8.3)

## 2011-09-13 LAB — CBC
MCH: 23.8 pg — ABNORMAL LOW (ref 26.0–34.0)
MCHC: 30.3 g/dL (ref 30.0–36.0)
Platelets: 271 10*3/uL (ref 150–400)
RDW: 14.7 % (ref 11.5–15.5)

## 2011-09-13 LAB — URINE MICROSCOPIC-ADD ON

## 2011-09-13 LAB — URINALYSIS, ROUTINE W REFLEX MICROSCOPIC
Hgb urine dipstick: NEGATIVE
Specific Gravity, Urine: 1.01 (ref 1.005–1.030)
Urobilinogen, UA: 0.2 mg/dL (ref 0.0–1.0)

## 2011-09-13 NOTE — Discharge Instructions (Signed)
Hypertension During Pregnancy Hypertension is also called high blood pressure. Blood pressure moves blood in your body. Sometimes, the force that moves the blood becomes too strong. When you are pregnant, this condition should be watched carefully. It can cause problems for you and your baby. HOME CARE   Make and keep all of your doctor visits.   Take medicine as told by your doctor. Tell your doctor about all medicines you take.   Eat very little salt.   Exercise regularly.   Do not drink alcohol.   Do not smoke.   Do not have drinks with caffeine.   Lie on your left side when resting.  GET HELP RIGHT AWAY IF:  You have bad belly (abdominal) pain.   You have sudden puffiness (swelling) in the hands, ankles, or face.   You gain 4 pounds (1.8 kilograms) or more in 1 week.   You throw up (vomit) repeatedly.   You have bleeding from the vagina.   You do not feel the baby moving as much.   You have a headache.   You have blurred or double vision.   You have muscle twitching or spasms.   You have shortness of breath.   You have Hoehn fingernails and lips.   You have blood in your pee (urine).  MAKE SURE YOU:  Understand these instructions.   Will watch your condition.   Will get help right away if you are not doing well.  Document Released: 06/22/2010 Document Revised: 05/09/2011 Document Reviewed: 01/04/2011 ExitCare Patient Information 2012 [employer].Hypertension During Pregnancy Hypertension is also called high blood pressure. Blood pressure moves blood in your body. Sometimes, the force that moves the blood becomes too strong. When you are pregnant, this condition should be watched carefully. It can cause problems for you and your baby. HOME CARE   Make and keep all of your doctor visits.   Take medicine as told by your doctor. Tell your doctor about all medicines you take.   Eat very little salt.   Exercise regularly.   Do not drink alcohol.   Do  not smoke.   Do not have drinks with caffeine.   Lie on your left side when resting.  GET HELP RIGHT AWAY IF:  You have bad belly (abdominal) pain.   You have sudden puffiness (swelling) in the hands, ankles, or face.   You gain 4 pounds (1.8 kilograms) or more in 1 week.   You throw up (vomit) repeatedly.   You have bleeding from the vagina.   You do not feel the baby moving as much.   You have a headache.   You have blurred or double vision.   You have muscle twitching or spasms.   You have shortness of breath.   You have Aponte fingernails and lips.   You have blood in your pee (urine).  MAKE SURE YOU:  Understand these instructions.   Will watch your condition.   Will get help right away if you are not doing well.  Document Released: 06/22/2010 Document Revised: 05/09/2011 Document Reviewed: 01/04/2011 ExitCare Patient Information 2012 [employer]. 

## 2011-09-13 NOTE — MAU Provider Note (Signed)
Whitney White is a 36 y.o. year old G14P3003 female at [redacted]w[redacted]d weeks gestation who was sent to MAU from [facility] for Pre-eclampsia work-up.  Denies HA, vision changes or epigastric pain.   History OB History    Grav Para Term Preterm Abortions TAB SAB Ect Mult Living   4 3 3  0 0 0 0 0 0 3     Past Medical History  Diagnosis Date  . Eczema   . Hypertension    Past Surgical History  Procedure Date  . No past surgeries    Family History: family history includes Diabetes in her maternal grandmother and Hypertension in her maternal grandmother and mother.  There is no history of Anesthesia problems. Social History:  reports that she has quit smoking. She does not have any smokeless tobacco history on file. She reports that she does not drink alcohol or use illicit drugs.  ROS: Otherwise neg  Dilation: 3 Effacement (%): Thick Station: -2 Exam by:: V Phillipe Clemon CNM Blood pressure 131/91, pulse 97, resp. rate 18, height 5' (1.524 m), weight 121.564 kg (268 lb), last menstrual period 12/25/2010. Maternal Exam:  Uterine Assessment: Contraction strength is mild.  Contraction frequency is rare.   Abdomen: Surgical scars: low transverse.   Fundal height is S=D.   Fetal presentation: vertex  Pelvis: adequate for delivery.   Cervix: Cervix evaluated by digital exam.     Fetal Exam Fetal Monitor Review: Mode: ultrasound.   Baseline rate: 130.  Variability: moderate (6-25 bpm).   Pattern: accelerations present and no decelerations.    Fetal State Assessment: Category I - tracings are normal.     Physical Exam  Nursing note and vitals reviewed. Constitutional: She is oriented to person, place, and time. She appears well-developed and well-nourished. No distress.  HENT:  Head: Normocephalic.  Cardiovascular: Normal rate.   Respiratory: Effort normal.  GI: Soft. There is no tenderness.  Genitourinary: Vagina normal and uterus normal.  Musculoskeletal: She exhibits edema (trace).    Neurological: She is alert and oriented to person, place, and time. She has normal reflexes.  Skin: Skin is warm and dry.  Psychiatric: She has a normal mood and affect.   Results for orders placed during the hospital encounter of 09/13/11 (from the past 24 hour(s))  URINALYSIS, ROUTINE W REFLEX MICROSCOPIC     Status: Abnormal   Collection Time   09/13/11 11:15 AM      Component Value Range   Color, Urine YELLOW  YELLOW    APPearance HAZY (*) CLEAR    Specific Gravity, Urine 1.010  1.005 - 1.030    pH 7.0  5.0 - 8.0    Glucose, UA NEGATIVE  NEGATIVE (mg/dL)   Hgb urine dipstick NEGATIVE  NEGATIVE    Bilirubin Urine NEGATIVE  NEGATIVE    Ketones, ur NEGATIVE  NEGATIVE (mg/dL)   Protein, ur NEGATIVE  NEGATIVE (mg/dL)   Urobilinogen, UA 0.2  0.0 - 1.0 (mg/dL)   Nitrite NEGATIVE  NEGATIVE    Leukocytes, UA SMALL (*) NEGATIVE   URINE MICROSCOPIC-ADD ON     Status: Abnormal   Collection Time   09/13/11 11:15 AM      Component Value Range   Squamous Epithelial / LPF FEW (*) RARE    WBC, UA 7-10  <3 (WBC/hpf)   RBC / HPF 0-2  <3 (RBC/hpf)   Bacteria, UA FEW (*) RARE    Urine-Other MUCOUS PRESENT    CBC     Status: Abnormal  Collection Time   09/13/11 12:00 PM      Component Value Range   WBC 7.8  4.0 - 10.5 (K/uL)   RBC 4.53  3.87 - 5.11 (MIL/uL)   Hemoglobin 10.8 (*) 12.0 - 15.0 (g/dL)   HCT 16.1 (*) 09.6 - 46.0 (%)   MCV 78.6  78.0 - 100.0 (fL)   MCH 23.8 (*) 26.0 - 34.0 (pg)   MCHC 30.3  30.0 - 36.0 (g/dL)   RDW 04.5  40.9 - 81.1 (%)   Platelets 271  150 - 400 (K/uL)  COMPREHENSIVE METABOLIC PANEL     Status: Abnormal   Collection Time   09/13/11 12:00 PM      Component Value Range   Sodium 135  135 - 145 (mEq/L)   Potassium 4.3  3.5 - 5.1 (mEq/L)   Chloride 99  96 - 112 (mEq/L)   CO2 27  19 - 32 (mEq/L)   Glucose, Bld 96  70 - 99 (mg/dL)   BUN 7  6 - 23 (mg/dL)   Creatinine, Ser 9.14  0.50 - 1.10 (mg/dL)   Calcium 9.4  8.4 - 78.2 (mg/dL)   Total Protein 7.1  6.0 -  8.3 (g/dL)   Albumin 2.9 (*) 3.5 - 5.2 (g/dL)   AST 20  0 - 37 (U/L)   ALT 16  0 - 35 (U/L)   Alkaline Phosphatase 168 (*) 39 - 117 (U/L)   Total Bilirubin 0.2 (*) 0.3 - 1.2 (mg/dL)   GFR calc non Af Amer >90  >90 (mL/min)   GFR calc Af Amer >90  >90 (mL/min)  LACTATE DEHYDROGENASE     Status: Normal   Collection Time   09/13/11 12:00 PM      Component Value Range   LDH 159  94 - 250 (U/L)   Prenatal labs: ABO, Rh:   Antibody:   Rubella:   RPR:    HBsAg:    HIV:    GBS:     Assessment/Plan: D/C home per consult w/ Dr. Gaynell Face [facility] precautions Follow-up Information    Follow up with [facility] on 09/16/2011. (MAU as needed if symptoms worsen)    Contact information:   [address] [place] [address]           Dorathy Kinsman 09/13/2011, 1:52 PM

## 2011-09-13 NOTE — Progress Notes (Signed)
Ms. Deatley was seen for ultrasound appointment today.  Please see [facility]OB[facility]N report for details.  

## 2011-09-13 NOTE — MAU Note (Signed)
Sent from MFM for evaluation of BP;

## 2011-09-16 ENCOUNTER — Other Ambulatory Visit: Payer: Self-pay | Admitting: Obstetrics

## 2011-09-16 ENCOUNTER — Inpatient Hospital Stay (HOSPITAL_COMMUNITY)
Admission: AD | Admit: 2011-09-16 | Discharge: 2011-09-16 | Disposition: A | Discharge status: Home / Self Care | Payer: Medicaid Other | Source: Ambulatory Visit | Attending: Obstetrics | Admitting: Obstetrics

## 2011-09-16 ENCOUNTER — Encounter (HOSPITAL_COMMUNITY): Payer: Self-pay | Admitting: *Deleted

## 2011-09-16 DIAGNOSIS — O1490 Unspecified pre-eclampsia, unspecified trimester: Secondary | ICD-10-CM

## 2011-09-16 DIAGNOSIS — O139 Gestational [pregnancy-induced] hypertension without significant proteinuria, unspecified trimester: Secondary | ICD-10-CM

## 2011-09-16 DIAGNOSIS — IMO0002 Reserved for concepts with insufficient information to code with codable children: Secondary | ICD-10-CM

## 2011-09-16 DIAGNOSIS — O409XX0 Polyhydramnios, unspecified trimester, not applicable or unspecified: Secondary | ICD-10-CM

## 2011-09-16 LAB — LACTATE DEHYDROGENASE: LDH: 173 U/L (ref 94–250)

## 2011-09-16 LAB — URIC ACID: Uric Acid, Serum: 5.1 mg/dL (ref 2.4–7.0)

## 2011-09-16 LAB — URINALYSIS, ROUTINE W REFLEX MICROSCOPIC
Nitrite: NEGATIVE
Specific Gravity, Urine: 1.01 (ref 1.005–1.030)
Urobilinogen, UA: 0.2 mg/dL (ref 0.0–1.0)

## 2011-09-16 LAB — POCT PREGNANCY, URINE: Preg Test, Ur: NEGATIVE

## 2011-09-16 LAB — COMPREHENSIVE METABOLIC PANEL
Albumin: 2.8 g/dL — ABNORMAL LOW (ref 3.5–5.2)
BUN: 7 mg/dL (ref 6–23)
Calcium: 9.6 mg/dL (ref 8.4–10.5)
Creatinine, Ser: 0.65 mg/dL (ref 0.50–1.10)
Total Protein: 6.3 g/dL (ref 6.0–8.3)

## 2011-09-16 LAB — CBC
HCT: 35.6 % — ABNORMAL LOW (ref 36.0–46.0)
MCHC: 30.1 g/dL (ref 30.0–36.0)
MCV: 78.6 fL (ref 78.0–100.0)
RDW: 14.8 % (ref 11.5–15.5)

## 2011-09-16 MED ORDER — ACETAMINOPHEN 325 MG PO TABS
650.0000 mg | ORAL_TABLET | Freq: Once | ORAL | Status: AC
  Administered 2011-09-16: 650 mg via ORAL
  Filled 2011-09-16: qty 2

## 2011-09-16 MED ORDER — BUTALBITAL-APAP-CAFFEINE 50-325-40 MG PO TABS: 1.0000 | ORAL_TABLET | Freq: Four times a day (QID) | ORAL | Status: AC | PRN

## 2011-09-16 MED ORDER — LABETALOL HCL 200 MG PO TABS: 200.0000 mg | ORAL_TABLET | Freq: Two times a day (BID) | ORAL | Status: DC

## 2011-09-16 MED ORDER — BUTALBITAL-APAP-CAFFEINE 50-325-40 MG PO TABS: 2.0000 | ORAL_TABLET | Freq: Once | ORAL | Status: DC

## 2011-09-16 MED ORDER — LABETALOL HCL 100 MG PO TABS
200.0000 mg | ORAL_TABLET | Freq: Once | ORAL | Status: AC
  Administered 2011-09-16: 200 mg via ORAL
  Filled 2011-09-16: qty 2

## 2011-09-16 NOTE — Discharge Instructions (Signed)

## 2011-09-16 NOTE — MAU Note (Addendum)
Pt sent over from the office due to elevated blood pressures for a pih eval. Reports a headache that started a couple hours ago, floaters since this morning.  Denies any epigastric pain.  + FM.

## 2011-09-16 NOTE — MAU Provider Note (Signed)
Whitney White is a 36 y.o. year old G63P3003 female at [redacted]w[redacted]d weeks gestation who was sent to MAU from [facility] for Pre-eclampsia work-up. She reports HA, Vision changes. Pos FM.  History OB History    Grav Para Term Preterm Abortions TAB SAB Ect Mult Living   4 3 3  0 0 0 0 0 0 3     Past Medical History  Diagnosis Date  . Eczema   . Hypertension    Past Surgical History  Procedure Date  . No past surgeries    Family History: family history includes Diabetes in her maternal grandmother and Hypertension in her maternal grandmother and mother.  There is no history of Anesthesia problems. Social History:  reports that she has quit smoking. She does not have any smokeless tobacco history on file. She reports that she does not drink alcohol or use illicit drugs.  ROS: Otherwise neg    Blood pressure 132/97, pulse 95, temperature 97.7 F (36.5 C), temperature source Oral, resp. rate 18, height 5' (1.524 m), weight 121.564 kg (268 lb), last menstrual period 12/25/2010. Patient Vitals for the past 24 hrs:  BP Temp Temp src Pulse Resp Height Weight  09/16/11 1517 149/86 mmHg - - 93  18  - -  09/16/11 1415 141/115 mmHg - - 102  - - -  09/16/11 1345 126/85 mmHg - - 101  - - -  09/16/11 1330 134/91 mmHg - - 103  - - -  09/16/11 1315 134/99 mmHg - - 105  - - -  09/16/11 1307 132/97 mmHg 97.7 F (36.5 C) Oral 95  18  5' (1.524 m) 121.564 kg (268 lb)    Maternal Exam:  Uterine Assessment: Contraction strength is mild.  Contraction frequency is rare.      Fetal Exam Fetal Monitor Review: Mode: ultrasound.   Baseline rate: 130.  Variability: moderate (6-25 bpm).   Pattern: accelerations present and no decelerations.    Fetal State Assessment: Category I - tracings are normal.     Physical Exam  Nursing note and vitals reviewed. Constitutional: She is oriented to person, place, and time. She appears well-developed and well-nourished. No distress.  Cardiovascular: Normal rate.     Respiratory: Effort normal.  GI: Soft. There is no tenderness.  Genitourinary: Vagina normal and uterus normal.  Musculoskeletal: She exhibits edema.  Neurological: She is alert and oriented to person, place, and time. She has normal reflexes.  Skin: Skin is warm and dry.  Psychiatric: She has a normal mood and affect.   Dilation: 3 Effacement (%): Thick Cervical Position: Posterior Station: -3 Presentation: Vertex Exam by:: Ivonne Andrew, CNM  Prenatal labs: ABO, Rh:   Antibody:   Rubella:   RPR:    HBsAg:    HIV:    GBS:     HA resolved w/ Tylenol Labetalol 200 mg given  Assessment/Plan: 1. Hypertension in pregnancy, preeclampsia, antepartum    D/C home per consult w/ Dr. Clearance Coots Follow-up Information    Follow up with [facility] on 09/17/2011. (Indiction of labor scheduled for 7:30 am. Arrive at registration  at 7:15.)         Medication List  As of 09/16/2011  9:40 PM   START taking these medications         butalbital-acetaminophen-caffeine 50-325-40 MG per tablet   Commonly known as: FIORICET, ESGIC   Take 1-2 tablets by mouth every 6 (six) hours as needed for headache.      labetalol  200 MG tablet   Commonly known as: NORMODYNE   Take 1 tablet (200 mg total) by mouth 2 (two) times daily.         CONTINUE taking these medications         omeprazole 20 MG capsule   Commonly known as: PRILOSEC      prenatal multivitamin Tabs      Vitamin D 2000 UNITS Caps           Labor and PIH precautions  Rafiq Bucklin 09/16/2011, 1:40 PM

## 2011-09-17 ENCOUNTER — Inpatient Hospital Stay (HOSPITAL_COMMUNITY)
Admission: AD | Admit: 2011-09-17 | Discharge: 2011-09-20 | DRG: 775 | Disposition: A | Discharge status: Home / Self Care | Payer: Medicaid Other | Source: Ambulatory Visit | Attending: Obstetrics | Admitting: Obstetrics

## 2011-09-17 ENCOUNTER — Encounter (HOSPITAL_COMMUNITY): Payer: Self-pay

## 2011-09-17 DIAGNOSIS — O139 Gestational [pregnancy-induced] hypertension without significant proteinuria, unspecified trimester: Principal | ICD-10-CM | POA: Diagnosis present

## 2011-09-17 DIAGNOSIS — O3660X0 Maternal care for excessive fetal growth, unspecified trimester, not applicable or unspecified: Secondary | ICD-10-CM | POA: Diagnosis present

## 2011-09-17 LAB — CBC
MCV: 77.6 fL — ABNORMAL LOW (ref 78.0–100.0)
Platelets: 287 10*3/uL (ref 150–400)
RDW: 15.2 % (ref 11.5–15.5)
WBC: 7.4 10*3/uL (ref 4.0–10.5)

## 2011-09-17 LAB — RPR: RPR Ser Ql: NONREACTIVE

## 2011-09-17 MED ORDER — BUTORPHANOL TARTRATE 2 MG/ML IJ SOLN: 1.0000 mg | INTRAMUSCULAR | Status: DC | PRN

## 2011-09-17 MED ORDER — LIDOCAINE HCL (PF) 1 % IJ SOLN
30.0000 mL | INTRAMUSCULAR | Status: DC | PRN
  Filled 2011-09-17 (×2): qty 30

## 2011-09-17 MED ORDER — PHENYLEPHRINE 40 MCG/ML (10ML) SYRINGE FOR IV PUSH (FOR BLOOD PRESSURE SUPPORT)
80.0000 ug | PREFILLED_SYRINGE | INTRAVENOUS | Status: DC | PRN
  Filled 2011-09-17: qty 5

## 2011-09-17 MED ORDER — LACTATED RINGERS IV SOLN
INTRAVENOUS | Status: DC
  Administered 2011-09-17 – 2011-09-18 (×4): via INTRAVENOUS

## 2011-09-17 MED ORDER — BUTALBITAL-APAP-CAFFEINE 50-325-40 MG PO TABS: 1.0000 | ORAL_TABLET | Freq: Four times a day (QID) | ORAL | Status: DC | PRN

## 2011-09-17 MED ORDER — HYDROXYZINE HCL 50 MG/ML IM SOLN: 50.0000 mg | Freq: Four times a day (QID) | INTRAMUSCULAR | Status: DC | PRN

## 2011-09-17 MED ORDER — IBUPROFEN 600 MG PO TABS
600.0000 mg | ORAL_TABLET | Freq: Four times a day (QID) | ORAL | Status: DC | PRN
  Administered 2011-09-18: 600 mg via ORAL
  Filled 2011-09-17: qty 1

## 2011-09-17 MED ORDER — LACTATED RINGERS IV SOLN: 500.0000 mL | INTRAVENOUS | Status: DC | PRN

## 2011-09-17 MED ORDER — LABETALOL HCL 200 MG PO TABS
200.0000 mg | ORAL_TABLET | Freq: Three times a day (TID) | ORAL | Status: DC
  Administered 2011-09-17: 200 mg via ORAL
  Filled 2011-09-17 (×2): qty 1

## 2011-09-17 MED ORDER — OXYTOCIN BOLUS FROM INFUSION
500.0000 mL | Freq: Once | INTRAVENOUS | Status: AC
  Administered 2011-09-18: 500 mL via INTRAVENOUS
  Filled 2011-09-17: qty 500

## 2011-09-17 MED ORDER — MISOPROSTOL 25 MCG QUARTER TABLET
25.0000 ug | ORAL_TABLET | ORAL | Status: DC | PRN
  Administered 2011-09-17 (×2): 25 ug via VAGINAL
  Filled 2011-09-17: qty 1
  Filled 2011-09-17 (×2): qty 0.25

## 2011-09-17 MED ORDER — EPHEDRINE 5 MG/ML INJ
10.0000 mg | INTRAVENOUS | Status: DC | PRN
  Filled 2011-09-17: qty 4

## 2011-09-17 MED ORDER — TERBUTALINE SULFATE 1 MG/ML IJ SOLN: 0.2500 mg | Freq: Once | INTRAMUSCULAR | Status: AC | PRN

## 2011-09-17 MED ORDER — NALBUPHINE HCL 10 MG/ML IJ SOLN: 10.0000 mg | Freq: Four times a day (QID) | INTRAMUSCULAR | Status: DC | PRN

## 2011-09-17 MED ORDER — LABETALOL HCL 200 MG PO TABS
200.0000 mg | ORAL_TABLET | Freq: Two times a day (BID) | ORAL | Status: DC
  Filled 2011-09-17 (×3): qty 1

## 2011-09-17 MED ORDER — ACETAMINOPHEN 325 MG PO TABS: 650.0000 mg | ORAL_TABLET | ORAL | Status: DC | PRN

## 2011-09-17 MED ORDER — PHENYLEPHRINE 40 MCG/ML (10ML) SYRINGE FOR IV PUSH (FOR BLOOD PRESSURE SUPPORT): 80.0000 ug | PREFILLED_SYRINGE | INTRAVENOUS | Status: DC | PRN

## 2011-09-17 MED ORDER — FENTANYL 2.5 MCG/ML BUPIVACAINE 1/10 % EPIDURAL INFUSION (WH - ANES)
14.0000 mL/h | INTRAMUSCULAR | Status: DC
  Administered 2011-09-18: 14 mL/h via EPIDURAL
  Filled 2011-09-17 (×2): qty 60

## 2011-09-17 MED ORDER — LACTATED RINGERS IV SOLN
500.0000 mL | Freq: Once | INTRAVENOUS | Status: AC
  Administered 2011-09-18: 500 mL via INTRAVENOUS

## 2011-09-17 MED ORDER — DIPHENHYDRAMINE HCL 50 MG/ML IJ SOLN: 12.5000 mg | INTRAMUSCULAR | Status: DC | PRN

## 2011-09-17 MED ORDER — OXYTOCIN 20 UNITS IN LACTATED RINGERS INFUSION - SIMPLE
1.0000 m[IU]/min | INTRAVENOUS | Status: DC
  Administered 2011-09-17: 2 m[IU]/min via INTRAVENOUS
  Filled 2011-09-17: qty 1000

## 2011-09-17 MED ORDER — OXYTOCIN 20 UNITS IN LACTATED RINGERS INFUSION - SIMPLE: 1.0000 m[IU]/min | INTRAVENOUS | Status: DC

## 2011-09-17 MED ORDER — CITRIC ACID-SODIUM CITRATE 334-500 MG/5ML PO SOLN: 30.0000 mL | ORAL | Status: DC | PRN

## 2011-09-17 MED ORDER — EPHEDRINE 5 MG/ML INJ: 10.0000 mg | INTRAVENOUS | Status: DC | PRN

## 2011-09-17 MED ORDER — HYDROXYZINE HCL 50 MG PO TABS: 50.0000 mg | ORAL_TABLET | Freq: Four times a day (QID) | ORAL | Status: DC | PRN

## 2011-09-17 MED ORDER — ONDANSETRON HCL 4 MG/2ML IJ SOLN: 4.0000 mg | Freq: Four times a day (QID) | INTRAMUSCULAR | Status: DC | PRN

## 2011-09-17 MED ORDER — NALBUPHINE SYRINGE 5 MG/0.5 ML
10.0000 mg | INJECTION | Freq: Four times a day (QID) | INTRAMUSCULAR | Status: DC | PRN
  Filled 2011-09-17: qty 1

## 2011-09-17 MED ORDER — OXYTOCIN 20 UNITS IN LACTATED RINGERS INFUSION - SIMPLE: 125.0000 mL/h | Freq: Once | INTRAVENOUS | Status: DC

## 2011-09-17 MED ORDER — NALBUPHINE HCL 10 MG/ML IJ SOLN: 10.0000 mg | INTRAMUSCULAR | Status: DC | PRN

## 2011-09-17 MED ORDER — OXYCODONE-ACETAMINOPHEN 5-325 MG PO TABS: 1.0000 | ORAL_TABLET | ORAL | Status: DC | PRN

## 2011-09-17 MED ORDER — NALBUPHINE SYRINGE 5 MG/0.5 ML
10.0000 mg | INJECTION | INTRAMUSCULAR | Status: DC | PRN
  Administered 2011-09-17: 10 mg via INTRAVENOUS
  Filled 2011-09-17 (×2): qty 1

## 2011-09-17 MED ORDER — PROCHLORPERAZINE EDISYLATE 5 MG/ML IJ SOLN
10.0000 mg | Freq: Four times a day (QID) | INTRAMUSCULAR | Status: DC | PRN
  Filled 2011-09-17: qty 2

## 2011-09-17 NOTE — Progress Notes (Signed)
Spoke with Dr Clearance Coots via telephone. Pt was requesting an epidural and wondering when the Dr would be back to break her water. Dr Clearance Coots does not want pt to have an epidural yet, to give IV pain medicine & IM pain medicine. Dr Clearance Coots will not be in to break pt water due to station of fetus in pelvis. Will inform pt, and will recheck pt's cervix at 2300 to update MD and decide further plan of care at that time.

## 2011-09-17 NOTE — Progress Notes (Signed)
Whitney White is a 36 y.o. G4P3003 at [redacted]w[redacted]d by LMP admitted for induction of labor due to Hypertension.  Subjective:  Patient also complains of HA, swelling of feet, hands  and visual changes; all worsening over past few days.   Objective:face BP 132/87  Pulse 98  Temp(Src) 98.5 F (36.9 C) (Oral)  Resp 18  Ht 5' (1.524 m)  Wt 118.842 kg (262 lb)  BMI 51.17 kg/m2  LMP 12/25/2010      FHT:  FHR: 150 bpm, variability: moderate,  accelerations:  Present,  decelerations:  Absent UC:   Irregular, mild. SVE:   Dilation: 3 Effacement (%): 70 Station: -2 Exam by:: hk  Labs: Lab Results  Component Value Date   WBC 7.4 09/17/2011   HGB 10.8* 09/17/2011   HCT 34.7* 09/17/2011   MCV 77.6* 09/17/2011   PLT 287 09/17/2011    Assessment / Plan: 38.0 weeks.  PIH.  LGA fetus.  Admitted for 2 stage IOL.  Completed cytotec cervical ripening.  Will now start pitocin per low dose protocol.  Labor: Latent phase. Preeclampsia:  no signs or symptoms of toxicity Fetal Wellbeing:  Category I Pain Control:  Labor support without medications I/D:  n/a Anticipated MOD:  NSVD  Manuel Lawhead A 09/17/2011, 5:24 PM

## 2011-09-17 NOTE — H&P (Signed)
Whitney White is a 36 y.o. female presenting for IOL for PIH and LGA infant. Maternal Medical History:  Reason for admission: Presents for IOL for PIH LGA infant.  Patient has a favorable cervix.    OB History    Grav Para Term Preterm Abortions TAB SAB Ect Mult Living   4 3 3  0 0 0 0 0 0 3     Past Medical History  Diagnosis Date  . Eczema   . Hypertension    Past Surgical History  Procedure Date  . No past surgeries    Family History: family history includes Diabetes in her maternal grandmother and Hypertension in her maternal grandmother and mother.  There is no history of Anesthesia problems. Social History:  reports that she has quit smoking. She does not have any smokeless tobacco history on file. She reports that she does not drink alcohol or use illicit drugs.  Review of Systems  Eyes: Positive for blurred vision.  Cardiovascular: Positive for leg swelling.  Musculoskeletal: Positive for back pain and joint pain.  Neurological: Positive for headaches.  All other systems reviewed and are negative.      Blood pressure 133/83, pulse 98, temperature 98.3 F (36.8 C), temperature source Oral, resp. rate 18, height 5' (1.524 m), weight 118.842 kg (262 lb), last menstrual period 12/25/2010. Maternal Exam:  Uterine Assessment: Contraction strength is mild.  Contraction duration is 30 seconds. Contraction frequency is rare.   Abdomen: Patient reports no abdominal tenderness. Fetal presentation: vertex  Introitus: Normal vulva. Normal vagina.    Physical Exam  Constitutional: She is oriented to person, place, and time. She appears well-developed and well-nourished.  HENT:  Head: Normocephalic and atraumatic.  Eyes: Conjunctivae are normal. Pupils are equal, round, and reactive to light.  Neck: Normal range of motion. Neck supple.  Cardiovascular: Normal rate and regular rhythm.   Respiratory: Effort normal.  GI: Soft.  Musculoskeletal: Normal range of motion.    Neurological: She is alert and oriented to person, place, and time.  Skin: Skin is warm and dry.  Psychiatric: She has a normal mood and affect. Her behavior is normal. Judgment and thought content normal.    Prenatal labs: ABO, Rh: A/Positive/-- (12/04 0000) Antibody: Negative (12/04 0000) Rubella: Immune (12/04 0000) RPR: Nonreactive (01/25 0000)  HBsAg: Negative (12/04 0000)  HIV: Non-reactive (12/04 0000)  GBS: Negative (03/26 0000)   Assessment/Plan: 38 weeks.  PIH.  LGA infant.   Brynlei Klausner A 09/17/2011, 8:24 AM

## 2011-09-17 NOTE — Progress Notes (Signed)
Dr Clearance Coots updated on pts cervical exam, uterine activity, and clots found during check. MD ordered to turn pitocin off, left pt rest. To restart pitocin at 0500 4/17. Will follow through with orders and inform pt of plan of care.

## 2011-09-18 ENCOUNTER — Inpatient Hospital Stay (HOSPITAL_COMMUNITY): Payer: Medicaid Other | Admitting: Anesthesiology

## 2011-09-18 ENCOUNTER — Ambulatory Visit (HOSPITAL_COMMUNITY): Payer: Medicaid Other

## 2011-09-18 ENCOUNTER — Encounter (HOSPITAL_COMMUNITY): Payer: Self-pay | Admitting: Anesthesiology

## 2011-09-18 ENCOUNTER — Encounter (HOSPITAL_COMMUNITY): Payer: Self-pay

## 2011-09-18 LAB — CBC
HCT: 35.4 % — ABNORMAL LOW (ref 36.0–46.0)
HCT: 36.2 % (ref 36.0–46.0)
Hemoglobin: 10.7 g/dL — ABNORMAL LOW (ref 12.0–15.0)
MCH: 23.5 pg — ABNORMAL LOW (ref 26.0–34.0)
MCHC: 29.8 g/dL — ABNORMAL LOW (ref 30.0–36.0)
MCHC: 30.2 g/dL (ref 30.0–36.0)
MCV: 78.9 fL (ref 78.0–100.0)
Platelets: 214 10*3/uL (ref 150–400)
RDW: 15 % (ref 11.5–15.5)

## 2011-09-18 MED ORDER — PRENATAL MULTIVITAMIN CH
1.0000 | ORAL_TABLET | Freq: Every day | ORAL | Status: DC
  Administered 2011-09-18 – 2011-09-20 (×3): 1 via ORAL
  Filled 2011-09-18 (×3): qty 1

## 2011-09-18 MED ORDER — OXYTOCIN 20 UNITS IN LACTATED RINGERS INFUSION - SIMPLE
125.0000 mL/h | INTRAVENOUS | Status: DC | PRN
  Filled 2011-09-18: qty 1000

## 2011-09-18 MED ORDER — SODIUM BICARBONATE 8.4 % IV SOLN
INTRAVENOUS | Status: DC | PRN
  Administered 2011-09-18: 4 mL via EPIDURAL

## 2011-09-18 MED ORDER — LABETALOL HCL 200 MG PO TABS
200.0000 mg | ORAL_TABLET | Freq: Three times a day (TID) | ORAL | Status: DC
  Administered 2011-09-18 – 2011-09-20 (×6): 200 mg via ORAL
  Filled 2011-09-18 (×9): qty 1

## 2011-09-18 MED ORDER — ONDANSETRON HCL 4 MG PO TABS: 4.0000 mg | ORAL_TABLET | ORAL | Status: DC | PRN

## 2011-09-18 MED ORDER — SIMETHICONE 80 MG PO CHEW: 80.0000 mg | CHEWABLE_TABLET | ORAL | Status: DC | PRN

## 2011-09-18 MED ORDER — DIPHENHYDRAMINE HCL 25 MG PO CAPS: 25.0000 mg | ORAL_CAPSULE | Freq: Four times a day (QID) | ORAL | Status: DC | PRN

## 2011-09-18 MED ORDER — OXYCODONE-ACETAMINOPHEN 5-325 MG PO TABS
1.0000 | ORAL_TABLET | ORAL | Status: DC | PRN
  Administered 2011-09-18 – 2011-09-19 (×2): 2 via ORAL
  Filled 2011-09-18 (×2): qty 2

## 2011-09-18 MED ORDER — BENZOCAINE-MENTHOL 20-0.5 % EX AERO
1.0000 "application " | INHALATION_SPRAY | CUTANEOUS | Status: DC | PRN
  Administered 2011-09-18: 1 via TOPICAL

## 2011-09-18 MED ORDER — FENTANYL 2.5 MCG/ML BUPIVACAINE 1/10 % EPIDURAL INFUSION (WH - ANES)
INTRAMUSCULAR | Status: DC | PRN
  Administered 2011-09-18: 13 mL/h via EPIDURAL

## 2011-09-18 MED ORDER — BENZOCAINE-MENTHOL 20-0.5 % EX AERO
INHALATION_SPRAY | CUTANEOUS | Status: AC
  Filled 2011-09-18: qty 56

## 2011-09-18 MED ORDER — SENNOSIDES-DOCUSATE SODIUM 8.6-50 MG PO TABS
2.0000 | ORAL_TABLET | Freq: Every day | ORAL | Status: DC
  Administered 2011-09-18 – 2011-09-19 (×2): 2 via ORAL

## 2011-09-18 MED ORDER — WITCH HAZEL-GLYCERIN EX PADS: 1.0000 "application " | MEDICATED_PAD | CUTANEOUS | Status: DC | PRN

## 2011-09-18 MED ORDER — IBUPROFEN 600 MG PO TABS
600.0000 mg | ORAL_TABLET | Freq: Four times a day (QID) | ORAL | Status: DC
  Administered 2011-09-18 – 2011-09-20 (×8): 600 mg via ORAL
  Filled 2011-09-18 (×8): qty 1

## 2011-09-18 MED ORDER — TETANUS-DIPHTH-ACELL PERTUSSIS 5-2.5-18.5 LF-MCG/0.5 IM SUSP
0.5000 mL | Freq: Once | INTRAMUSCULAR | Status: AC
  Administered 2011-09-19: 0.5 mL via INTRAMUSCULAR
  Filled 2011-09-18: qty 0.5

## 2011-09-18 MED ORDER — ONDANSETRON HCL 4 MG/2ML IJ SOLN: 4.0000 mg | INTRAMUSCULAR | Status: DC | PRN

## 2011-09-18 MED ORDER — OXYTOCIN 20 UNITS IN LACTATED RINGERS INFUSION - SIMPLE: 1.0000 m[IU]/min | INTRAVENOUS | Status: DC

## 2011-09-18 MED ORDER — DIBUCAINE 1 % RE OINT: 1.0000 "application " | TOPICAL_OINTMENT | RECTAL | Status: DC | PRN

## 2011-09-18 MED ORDER — OXYTOCIN 10 UNIT/ML IJ SOLN
INTRAMUSCULAR | Status: AC
  Filled 2011-09-18: qty 2

## 2011-09-18 MED ORDER — LANOLIN HYDROUS EX OINT: TOPICAL_OINTMENT | CUTANEOUS | Status: DC | PRN

## 2011-09-18 MED ORDER — ZOLPIDEM TARTRATE 5 MG PO TABS: 5.0000 mg | ORAL_TABLET | Freq: Every evening | ORAL | Status: DC | PRN

## 2011-09-18 MED ORDER — MEDROXYPROGESTERONE ACETATE 150 MG/ML IM SUSP: 150.0000 mg | INTRAMUSCULAR | Status: DC | PRN

## 2011-09-18 NOTE — Progress Notes (Signed)
Whitney White is a 36 y.o. G4P3003 at [redacted]w[redacted]d by LMP admitted for induction of labor due to Hypertension.  Subjective:   Objective: BP 143/103  Pulse 110  Temp(Src) 97.7 F (36.5 C) (Oral)  Resp 20  Ht 5' (1.524 m)  Wt 118.842 kg (262 lb)  BMI 51.17 kg/m2  SpO2 97%  LMP 12/25/2010      FHT:  FHR: 150 bpm, variability: moderate,  accelerations:  Present,  decelerations:  Absent UC:   regular, every 3 minutes SVE:   Dilation: 7.5 Effacement (%): 80 Station: -1;0 Exam by:: l.poore,rn  Labs: Lab Results  Component Value Date   WBC 7.3 09/18/2011   HGB 10.7* 09/18/2011   HCT 35.4* 09/18/2011   MCV 78.7 09/18/2011   PLT 244 09/18/2011    Assessment / Plan: Induction of labor due to PIH,  progressing well on pitocin  Labor: Progressing normally Preeclampsia:  none Fetal Wellbeing:  Category I Pain Control:  Epidural I/D:  n/a Anticipated MOD:  NSVD  Whitney White A 09/18/2011, 12:50 AM

## 2011-09-18 NOTE — Anesthesia Preprocedure Evaluation (Addendum)
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 Adequate mouth opening. No Hx of GA: airway looks challanging Dental   Pulmonary          Cardiovascular hypertension, Pt. on medications     Neuro/Psych    GI/Hepatic GERD-  Medicated,  Endo/Other  Morbid obesity  Renal/GU      Musculoskeletal   Abdominal   Peds  Hematology   Anesthesia Other Findings   Reproductive/Obstetrics                          Anesthesia Physical Anesthesia Plan  ASA: III  Anesthesia Plan: Epidural   Post-op Pain Management:    Induction:   Airway Management Planned:   Additional Equipment:   Intra-op Plan:   Post-operative Plan:   Informed Consent: I have reviewed the patients History and Physical, chart, labs and discussed the procedure including the risks, benefits and alternatives for the proposed anesthesia with the patient or authorized representative who has indicated his/her understanding and acceptance.   Dental Advisory Given  Plan Discussed with:   Anesthesia Plan Comments: (Labs checked- platelets confirmed with RN in room. Fetal heart tracing, per RN, reported to be stable enough for sitting procedure. Discussed epidural, and patient consents to the procedure:  included risk of possible headache,backache, failed block, allergic reaction, and nerve injury. This patient was asked if she had any questions or concerns before the procedure started. )        Anesthesia Quick Evaluation

## 2011-09-18 NOTE — Anesthesia Procedure Notes (Signed)
Epidural Patient location during procedure: OB  Preanesthetic Checklist Completed: patient identified, site marked, surgical consent, pre-op evaluation, timeout performed, IV checked, risks and benefits discussed and monitors and equipment checked  Epidural Patient position: sitting Prep: site prepped and draped and DuraPrep Patient monitoring: continuous pulse ox and blood pressure Approach: midline Injection technique: LOR air  Needle:  Needle type: Tuohy  Needle gauge: 17 G Needle length: 9 cm Needle insertion depth: 8 cm Catheter type: closed end flexible Catheter size: 19 Gauge Test dose: negative  Assessment Events: blood not aspirated, injection not painful, no injection resistance, negative IV test and no paresthesia  Additional Notes Dosing of Epidural:  1st dose, through catheter ............................................Marland Kitchen epi 1:200K + Xylocaine 40 mg  2nd dose, through catheter, after waiting 3 minutes...Marland KitchenMarland Kitchenepi 1:200K + Xylocaine 40 mg  3rd dose, through catheter after waiting 3 minutes .............................Marcaine   4mg    ( mg Marcaine are expressed as equivilent  cc's medication removed from the 0.1%Bupiv / fentanyl syringe from L&D pump)  ( 2% Xylo charted as a single dose in Epic Meds for ease of charting; actual dosing was fractionated as above, for saftey's sake)  As each dose occurred, patient was free of IV sx; and patient exhibited no evidence of SA injection.  Patient is more comfortable after epidural dosed. Please see RN's note for documentation of vital signs,and FHR which are stable.

## 2011-09-18 NOTE — Anesthesia Postprocedure Evaluation (Signed)
  Anesthesia Post-op Note  Patient: Whitney White  Procedure(s) Performed: * No procedures listed *  Patient Location: PACU  Anesthesia Type: Epidural  Level of Consciousness: awake, alert , oriented and patient cooperative  Airway and Oxygen Therapy: Patient Spontanous Breathing  Post-op Pain: none  Post-op Assessment: Post-op Vital signs reviewed  Post-op Vital Signs: Reviewed and stable  Complications: No apparent anesthesia complications

## 2011-09-19 ENCOUNTER — Ambulatory Visit (HOSPITAL_COMMUNITY): Payer: Medicaid Other

## 2011-09-19 ENCOUNTER — Ambulatory Visit (HOSPITAL_COMMUNITY): Admit: 2011-09-19 | Payer: Medicaid Other

## 2011-09-19 LAB — CBC
Hemoglobin: 9 g/dL — ABNORMAL LOW (ref 12.0–15.0)
MCH: 23.6 pg — ABNORMAL LOW (ref 26.0–34.0)
MCHC: 29.9 g/dL — ABNORMAL LOW (ref 30.0–36.0)
RDW: 15.1 % (ref 11.5–15.5)

## 2011-09-19 NOTE — Progress Notes (Signed)
Post Partum Day 1 Subjective: no complaints, up ad lib, voiding, tolerating PO and + flatus  Objective: Blood pressure 111/74, pulse 92, temperature 98.5 F (36.9 C), temperature source Oral, resp. rate 18, height 5' (1.524 m), weight 118.842 kg (262 lb), last menstrual period 12/25/2010, SpO2 96.00%, unknown if currently breastfeeding.  Physical Exam:  General: alert and no distress Lochia: appropriate Uterine Fundus: firm Incision: n/a DVT Evaluation: No evidence of DVT seen on physical exam.   Basename 09/19/11 0515 09/18/11 0615  HGB 9.0* 10.8*  HCT 30.1* 36.2    Assessment/Plan: Plan for discharge tomorrow   LOS: 2 days   Dishon Kehoe A 09/19/2011, 10:12 AM

## 2011-09-19 NOTE — Progress Notes (Addendum)
Pt c/o blurred vision and headache 8/10 on pain scale.  BP 148/95.  DTRs 1+, no clonus, denies RUQ pain, 2+ pitting edema bilateral LEs.  Phoned Dr Clearance Coots with update.  He stated he would order Norvasc and PIH labs for patient.

## 2011-09-19 NOTE — Progress Notes (Signed)
UR Chart review completed.  

## 2011-09-19 NOTE — Progress Notes (Signed)
Clinical Social Work Department PSYCHOSOCIAL ASSESSMENT - MATERNAL/CHILD 09/19/2011  Patient:  Whitney White, Whitney White  Account Number:  [id number]  Admit Date:  09/17/2011  Marjo Bicker Name:   Urban Gibson    Clinical Social Worker:  Lulu Riding, LCSW   Date/Time:  09/19/2011 11:45 AM  Date Referred:  09/19/2011   Referral source  NICU     Referred reason  NICU   Other referral source:    I:  FAMILY / HOME ENVIRONMENT Child's legal guardian:  PARENT  Guardian - Name Guardian - Age Guardian - Address  [facility] [address] [place], [place] [address]  Elberta Leatherwood  same   Other household support members/support persons Name Relationship DOB  Docia Barrier 11  Corian BROTHER 9  Henrene Pastor 5   Other support:   Good support system of family and friends.    II  PSYCHOSOCIAL DATA Information Source:  Patient Interview  Financial and Community Resources Employment:   MOB lost her job at [employer] in October and will be staying home with baby until she can find employment again.  FOB works at [employer] resources:  [employer] If [employer]:  [employer] / Grade:   Maternity Care Coordinator / Child Services Coordination / Early Interventions:  Cultural issues impacting care:   none known    III  STRENGTHS Strengths  Adequate Resources  Compliance with medical plan  Home prepared for Child (including basic supplies)  Other - See comment  Supportive family/friends  Understanding of illness   Strength comment:  Pediatric follow up will be at [facility]   IV  RISK FACTORS AND CURRENT PROBLEMS Current Problem:  YES   Risk Factor & Current Problem Patient Issue Family Issue Risk Factor / Current Problem Comment  Mental Illness Y N Hx. of PPD    V  SOCIAL WORK ASSESSMENT SW met with MOB in her third floor room/309 to introduce myself, complete assessment and evaluate how she is coping with baby's  admission to NICU.  MOB was friendly and seems to be handling the situation well.  She seems to have a good understanding of the situation and is hopeful that baby will not have to be here long.  She realizes that it will be hard to leave the hospital without him if he is not ready for discharge tomorrow.  She reports that FOB has "been around a long time" and is supportive.  He is not the father of her first two children, but cares for them as if he is.  She reports having a good support system and everything she needs for baby at home.  MOB's PNR notes "previously used marijuana" and seems to indicate that this was before pregnancy.  No screens were completed on MOB or baby and SW did not address this issue since it is not current.  SW asked about hx of depression as noted in chart.  MOB states that she had some depressive symptoms after her last child and took Effexor for a period of time. She denies depressive symptoms at this time, but states she feels comfortable talking with her doctor if symptoms arise.  SW discussed signs and symptoms of PPD and gave Feelings After Birth handout.  SW has no social concerns at this time.  SW explained support services offered by NICU SWs and informed MOB on how to reach SW if questions or needs arise.  MOB has no questions or  needs at this time.      VI SOCIAL WORK PLAN Social Work Plan  Psychosocial Support/Ongoing Assessment of Needs   Type of pt/family education:   If child protective services report - county:   If child protective services report - date:   Information/referral to community resources comment:   Other social work plan:

## 2011-09-19 NOTE — Progress Notes (Signed)
Dr Clearance Coots on floor to see patient.  Pt in NICU.  RN called NICU for patient to return to floor.  When patient arrived Dr. Clearance Coots spoke to pt in hallway by nurse's desk.  No new orders placed.

## 2011-09-19 NOTE — Addendum Note (Signed)
Addendum  created 09/19/11 1833 by Jiles Garter, MD   Modules edited:Anesthesia Events

## 2011-09-19 NOTE — Progress Notes (Signed)
Orders mentioned in previous note not received during afternoon.  Pt's headache and blurred vision resolved.  Phoned Dr Clearance Coots to confirm his intention to place Norvasc and PIH labs, but phone answered by nurse as he was in a delivery.  Asked that Dr Clearance Coots call this RN when finished with delivery.

## 2011-09-20 MED ORDER — FERROUS SULFATE 325 (65 FE) MG PO TABS: 325.0000 mg | ORAL_TABLET | Freq: Two times a day (BID) | ORAL | Status: DC

## 2011-09-20 MED ORDER — NORETHINDRONE 0.35 MG PO TABS: 1.0000 | ORAL_TABLET | Freq: Every day | ORAL | Status: DC

## 2011-09-20 MED ORDER — OXYCODONE-ACETAMINOPHEN 5-325 MG PO TABS: 1.0000 | ORAL_TABLET | Freq: Four times a day (QID) | ORAL | Status: AC | PRN

## 2011-09-20 NOTE — Progress Notes (Signed)
Patient very tearful, worried about her blood pressure and about baby in nicu.

## 2011-09-20 NOTE — Discharge Summary (Signed)
Obstetric Discharge Summary Reason for Admission: induction of labor Prenatal Procedures: none Intrapartum Procedures: spontaneous vaginal delivery Postpartum Procedures: none Complications-Operative and Postpartum: none Hemoglobin  Date Value Range Status  09/19/2011 9.0* 12.0-15.0 (g/dL) Final     HCT  Date Value Range Status  09/19/2011 30.1* 36.0-46.0 (%) Final    Physical Exam:  General: alert Lochia: appropriate Uterine Fundus: firm Incision: n/a DVT Evaluation: No evidence of DVT seen on physical exam.  Discharge Diagnoses: Term Pregnancy-delivered  Discharge Information:  See above Date: 09/20/2011 Activity: pelvic rest Diet: routine Medications: PNV and Percocet  Micronor FeSO4 Condition: stable Instructions: see above Discharge to: home Follow-up Information    Follow up with HARPER,CHARLES A, MD. Call in 4 days.   Contact information:   [address] [place] [address] [phone]          Newborn Data: Live born female  Birth Weight: 8 lb 6 oz (3799 g) APGAR: 8, 9  Home with NICU.  JACKSON-MOORE,Yanky Vanderburg A 09/20/2011, 9:20 AM

## 2011-09-20 NOTE — Discharge Instructions (Signed)

## 2011-09-20 NOTE — Progress Notes (Signed)
Nurse tech mentioned that pt had been tearful to find out she would be discharged before baby was ready to come home from NICU.  I visited with [facility] who was in good spirits.  She was sad that her baby was not ready to come home with her and sad that her older children could not meet him yet, but she also wants him to be healthy and has made her peace with the fact that he will still be here.    I offered compassionate listening and pastoral presence as she shared her story about her birth experience.  [address] [place] Pager, [phone] 11:30  09/20/11 1200  Clinical Encounter Type  Visited With Patient  Visit Type Initial;Spiritual support  Referral From Nurse  Spiritual Encounters  Spiritual Needs Emotional

## 2011-09-22 ENCOUNTER — Inpatient Hospital Stay (HOSPITAL_COMMUNITY): Payer: Medicaid Other

## 2011-09-22 ENCOUNTER — Inpatient Hospital Stay (HOSPITAL_COMMUNITY)
Admission: AD | Admit: 2011-09-22 | Discharge: 2011-09-22 | Disposition: A | Discharge status: Home / Self Care | Payer: Medicaid Other | Source: Ambulatory Visit | Attending: Obstetrics | Admitting: Obstetrics

## 2011-09-22 ENCOUNTER — Encounter (HOSPITAL_COMMUNITY): Payer: Self-pay | Admitting: *Deleted

## 2011-09-22 LAB — CBC
HCT: 33 % — ABNORMAL LOW (ref 36.0–46.0)
Hemoglobin: 10 g/dL — ABNORMAL LOW (ref 12.0–15.0)
MCH: 23.9 pg — ABNORMAL LOW (ref 26.0–34.0)
MCV: 78.9 fL (ref 78.0–100.0)
Platelets: 247 10*3/uL (ref 150–400)

## 2011-09-22 MED ORDER — IBUPROFEN 600 MG PO TABS
600.0000 mg | ORAL_TABLET | Freq: Four times a day (QID) | ORAL | Status: DC | PRN
  Administered 2011-09-22: 600 mg via ORAL
  Filled 2011-09-22: qty 1

## 2011-09-22 MED ORDER — IBUPROFEN 600 MG PO TABS: 600.0000 mg | ORAL_TABLET | Freq: Four times a day (QID) | ORAL | Status: AC | PRN

## 2011-09-22 NOTE — Progress Notes (Signed)
50cc yellow breast milk pumped and placed on ice.

## 2011-09-22 NOTE — Progress Notes (Signed)
To US via WC.

## 2011-09-22 NOTE — Progress Notes (Signed)
Given breast pump to pump milk.  On phone w/NICU to see if baby can go home today.

## 2011-09-22 NOTE — Progress Notes (Signed)
States chest no longer hurts and headache does not hurt as much anymore.

## 2011-09-22 NOTE — MAU Provider Note (Signed)
Whitney White [id number] @4d  s/p NSVD Chief Complaint  Patient presents with  . Vaginal Bleeding     First Provider Initiated Contact with Patient 09/22/11 1014      SUBJECTIVE  HPI: She states that her bleeding was heavier than usual before leaving the hospital and for the last 2 days has been having increasing amount of blood flow sporadically with clots and passed what she thinks is membranous tissue ( glisteny 4-5 inches) twice over 2 days. No orthostatic symptoms. Has mild H/A. Having pleuritic chest pain left upper chest, no shortness of breath. Associated with menstrual-like cramps. She is pumping as the baby is still in NICU. She's taking labetalol as directed.   Past Medical History  Diagnosis Date  . Eczema   . Hypertension    Past Surgical History  Procedure Date  . No past surgeries    History   Social History  . Marital Status: Divorced    Spouse Name: N/A    Number of Children: N/A  . Years of Education: N/A   Occupational History  . Not on file.   Social History Main Topics  . Smoking status: Former Games developer  . Smokeless tobacco: Not on file  . Alcohol Use: No  . Drug Use: No  . Sexually Active: Yes    Birth Control/ Protection: None, IUD, Pill     merania removed   Other Topics Concern  . Not on file   Social History Narrative  . No narrative on file   No current facility-administered medications on file prior to encounter.   Current Outpatient Prescriptions on File Prior to Encounter  Medication Sig Dispense Refill  . butalbital-acetaminophen-caffeine (FIORICET) 50-325-40 MG per tablet Take 1-2 tablets by mouth every 6 (six) hours as needed for headache.  20 tablet  1  . Cholecalciferol (VITAMIN D) 2000 UNITS CAPS Take 2,000 Units by mouth every morning.      . ferrous sulfate 325 (65 FE) MG tablet Take 1 tablet (325 mg total) by mouth 2 (two) times daily before a meal.  60 tablet  11  . labetalol (NORMODYNE) 200 MG tablet Take 1 tablet (200  mg total) by mouth 2 (two) times daily.  30 tablet  1  . norethindrone (ORTHO MICRONOR) 0.35 MG tablet Take 1 tablet (0.35 mg total) by mouth daily. Second Sunday start  28 tablet  11  . omeprazole (PRILOSEC) 20 MG capsule Take 20 mg by mouth 2 (two) times daily.      Marland Kitchen oxyCODONE-acetaminophen (PERCOCET) 5-325 MG per tablet Take 1-2 tablets by mouth every 6 (six) hours as needed (moderate - severe pain).  30 tablet  0  . Prenatal Vit-Fe Fumarate-FA (PRENATAL MULTIVITAMIN) TABS Take 1 tablet by mouth every morning.       No Known Allergies  ROS: Pertinent items in HPI  OBJECTIVE Blood pressure 141/80, pulse 89, temperature 97.9 F (36.6 C), temperature source Oral, resp. rate 20, height 5' (1.524 m), weight 113.127 kg (249 lb 6.4 oz), last menstrual period 12/25/2010, SpO2 95.00%, unknown if currently breastfeeding. O2 sat 98%  GENERAL: Well-developed, well-nourished female in no acute distress. BREASTS: Engorged bilat, mild red streaking COR: RRR w/o m LUNGS: CTA bilat. No reproducible CP  ABDOMEN: Soft, nontender, FF u-2 EXTREMITIES: Nontender, 3-4+ edema, 1+ DTRs SPECULUM EXAM: NEFG, small amountblood noted, no clots or tissuecervix clean VE: cervix 1/long  MAU course: CP resolved after pumping. Lactation consultant saw pt.  Ibuprofen given.  LAB RESULTS Hgb 9.0 at  DOD  Results for orders placed during the hospital encounter of 09/22/11 (from the past 24 hour(s))  CBC     Status: Abnormal   Collection Time   09/22/11 10:34 AM      Component Value Range   WBC 9.0  4.0 - 10.5 (K/uL)   RBC 4.18  3.87 - 5.11 (MIL/uL)   Hemoglobin 10.0 (*) 12.0 - 15.0 (g/dL)   HCT 16.1 (*) 09.6 - 46.0 (%)   MCV 78.9  78.0 - 100.0 (fL)   MCH 23.9 (*) 26.0 - 34.0 (pg)   MCHC 30.3  30.0 - 36.0 (g/dL)   RDW 04.5  40.9 - 81.1 (%)   Platelets 247  150 - 400 (K/uL)    IMAGING [place]: sm amt heterogenous material in uterus, no flow  ASSESSMENT 4 d PP NSVD S/p passage of retained membranous fragment  by hx, hemodynamically stable GHTN  PLAN D/W Dr. Gaynell Face. F/U in office as scheduled, sooner if concerns re: amount of bleeding, passage of tissue or orthostatic sx Cont Labetalol F/U with Lactation as needed        Whitney White 09/22/2011 10:34 AM

## 2011-09-22 NOTE — Progress Notes (Signed)
Spoke to [place], [facility], who is in NICU-she will meet pt in NICU and help pt there after discharge. Baby is going home today. Pt given milk on ice and d/c instructions. She has finished lunch.

## 2011-09-22 NOTE — Discharge Instructions (Signed)
Postpartum Hemorrhage  Postpartum hemorrhage is blood loss of more than 500 mL after childbirth. This is about two cups of blood. Most bleeding happens within 24 hours after birth. This is called primary postpartum hemorrhage. Less commonly, bleeding occurs more than 24 hours after birth, which is called secondary hemorrhage. The most common cause is uterine atony. This means your uterus does not shrink (contract) properly following delivery. When this happens, the blood vessels in the uterus are not squeezed and they keep on bleeding. This is serious.  SYMPTOMS   Bleeding after delivery is normal and you should expect vaginal bleeding. This means you will have bleeding coming from the birth canal for many days following childbirth. This is to be expected with both normal births and c-sections.   You are bleeding too much following your delivery if you are:   Passing large clots or pieces of tissue. This may be small pieces of placenta left after delivery.   Soaking more than one sanitary pad per hour for several hours.   Having heavy, bright-red bleeding which occurs four days or more after delivery.   Having a discharge which has a bad smell or if you begin to run an unexplained temperature over 101 F (38.3 C).   Having times of lightheadedness or fainting, feeling short of breath or having your heart beat fast with very little activity.  If you are having any of these symptoms, call your caregiver right away.  TREATMENT   Treatments used include medications and blood transfusions. If these treatments do not work, surgery may have to be done to remove the womb (uterus).   Sometimes bleeding occurs if portions of the afterbirth (placenta) are left behind in the uterus following delivery. If this happens, often a curettage or scraping of the inside of the uterus must be done. This usually stops the bleeding.   If bleeding is due to clotting or bleeding problems, which are not related to the pregnancy,  other treatments may be needed.  HOME CARE INSTRUCTIONS    Your caregiver may order bed rest (getting up to the bathroom only), or may allow you to continue light activity.   Keep track of the number of pads you use each day and how soaked (saturated) they are. Write this down.   Do no use tampons. Do not douche or have sexual intercourse until approved by your physician.   Rest and drink extra fluids.   Get proper amounts of rest and exercise.   If you are anemic following your pregnancy, be sure to take the iron and vitamin supplements that your caregiver recommends.   A diet rich in iron, such as spinach, red meat and legumes will be helpful.  SEEK IMMEDIATE MEDICAL CARE IF:   You experience severe cramps in your stomach, back or belly (abdomen).   You run an unexplained temperature of over 101 F (38.3 C) or as told by your caregiver.   You pass large clots or tissue. Save any tissue for your caregiver to look at.   Your bleeding increases or you become light-headed, weak, or faint.  Document Released: 08/10/2003 Document Revised: 05/09/2011 Document Reviewed: 03/04/2008  ExitCare Patient Information 2012 [employer].

## 2011-09-22 NOTE — Progress Notes (Signed)
Spoke to [place], [facility] re:pt. And engorgment.  Pt has been pumping left breast and if finally letting down some milk.  Given warm compresses to both breasts as encouraged by LC.

## 2011-09-22 NOTE — MAU Note (Signed)
Post partum 4/17 . C/O passing blood clots and having abd pain. Also c/o when she takes a deep breath in she gets p[ain in her upper left chest.

## 2011-09-22 NOTE — Progress Notes (Signed)
Spoke to [place], Scientist, research (life sciences) saw pt yesterday re: engorgement and pt last pumping yesterday early am w/ LC here at [facility].  Will see pt as soon as she can this afternoon. Described pt is engorged and has red streaks on right breast but had pumped some mild on this side.

## 2011-09-22 NOTE — Progress Notes (Signed)
Ibuprofen given for engorgment discomfort per [facility] recommendation and CNM order.

## 2011-09-24 ENCOUNTER — Encounter (HOSPITAL_COMMUNITY): Payer: Self-pay | Admitting: *Deleted

## 2011-09-24 ENCOUNTER — Inpatient Hospital Stay (HOSPITAL_COMMUNITY)
Admission: AD | Admit: 2011-09-24 | Discharge: 2011-09-24 | Disposition: A | Discharge status: Home / Self Care | Payer: Medicaid Other | Source: Ambulatory Visit | Attending: Obstetrics | Admitting: Obstetrics

## 2011-09-24 DIAGNOSIS — O1200 Gestational edema, unspecified trimester: Secondary | ICD-10-CM

## 2011-09-24 DIAGNOSIS — O139 Gestational [pregnancy-induced] hypertension without significant proteinuria, unspecified trimester: Secondary | ICD-10-CM

## 2011-09-24 DIAGNOSIS — IMO0002 Reserved for concepts with insufficient information to code with codable children: Secondary | ICD-10-CM

## 2011-09-24 DIAGNOSIS — O135 Gestational [pregnancy-induced] hypertension without significant proteinuria, complicating the puerperium: Secondary | ICD-10-CM | POA: Insufficient documentation

## 2011-09-24 MED ORDER — TRIAMTERENE-HCTZ 37.5-25 MG PO CAPS: 1.0000 | ORAL_CAPSULE | ORAL | Status: DC

## 2011-09-24 NOTE — MAU Provider Note (Signed)
Whitney White [id number] @PPD #6 Chief Complaint  Patient presents with  . Hypertension     None     SUBJECTIVE  HPI: Sent in to MAU due to blood pressure 148/99 per home health nurse today. She's also concern that she's had headaches since delivery including severe headache last night. Denies visual disturbance. States headache now is very mild and declines treatment for it.  Breast-feeding, not sleeping well. Very concerned that her dependent edema is not improving despite leg elevation and applying ice. She is taking labetalol 200 mg twice a day for her gestational hypertension as directed.  Seen here 2 days ago after passing clots and what sounded like a fragment of placental tissue. Ultrasound was inconclusive. She states her bleeding has lightened up a lot since then.  Past Medical History  Diagnosis Date  . Eczema   . Hypertension    Past Surgical History  Procedure Date  . No past surgeries    History   Social History  . Marital Status: Divorced    Spouse Name: N/A    Number of Children: N/A  . Years of Education: N/A   Occupational History  . Not on file.   Social History Main Topics  . Smoking status: Former Games developer  . Smokeless tobacco: Not on file  . Alcohol Use: No  . Drug Use: No  . Sexually Active: Not Currently    Birth Control/ Protection: None, Other-see comments     merania removed   Other Topics Concern  . Not on file   Social History Narrative  . No narrative on file   No current facility-administered medications on file prior to encounter.   Current Outpatient Prescriptions on File Prior to Encounter  Medication Sig Dispense Refill  . butalbital-acetaminophen-caffeine (FIORICET) 50-325-40 MG per tablet Take 1-2 tablets by mouth every 6 (six) hours as needed for headache.  20 tablet  1  . Cholecalciferol (VITAMIN D) 2000 UNITS CAPS Take 2,000 Units by mouth every morning.      . ferrous sulfate 325 (65 FE) MG tablet Take 1 tablet (325 mg  total) by mouth 2 (two) times daily before a meal.  60 tablet  11  . ibuprofen (ADVIL,MOTRIN) 600 MG tablet Take 1 tablet (600 mg total) by mouth every 6 (six) hours as needed for pain.  30 tablet  1  . labetalol (NORMODYNE) 200 MG tablet Take 1 tablet (200 mg total) by mouth 2 (two) times daily.  30 tablet  1  . omeprazole (PRILOSEC) 20 MG capsule Take 20 mg by mouth 2 (two) times daily.      Marland Kitchen oxyCODONE-acetaminophen (PERCOCET) 5-325 MG per tablet Take 1-2 tablets by mouth every 6 (six) hours as needed (moderate - severe pain).  30 tablet  0  . norethindrone (ORTHO MICRONOR) 0.35 MG tablet Take 1 tablet (0.35 mg total) by mouth daily. Second Sunday start  28 tablet  11   No Known Allergies  ROS: Pertinent items in HPI  OBJECTIVE Blood pressure 157/98, pulse 74, temperature 98.4 F (36.9 C), temperature source Oral, resp. rate 18, height 5' 0.5" (1.537 m), weight 110.133 kg (242 lb 12.8 oz), SpO2 97.00%, unknown if currently breastfeeding.  Serial BPs: 130/80-157/98 GENERAL: Well-developed, well-nourished female in no acute distress.  ABDOMEN: Soft, nontender EXTREMITIES: Nontender, 3-4+ dependent edema pitting to B/K  ASSESSMENT Para 4 PPD#6 NSVD GHTN Gestational edema  PLAN C/W Dr. Clearance White  Increase labetalol to 200 mg tid and add Dyazide 37.5/25 mg qd F/U  in office in 3 days     Whitney White 09/24/2011 4:30 PM

## 2011-09-24 NOTE — MAU Note (Signed)
Pt states she noted elevation in blood pressure last night pt states she had a headache and had double vision

## 2011-09-24 NOTE — MAU Note (Signed)
Patient states she has been having headaches, seeing spots and increasing swelling of feet. Home health nurse today took BP of 148/99 and was sent to MAU for further evaluation.

## 2011-09-24 NOTE — Discharge Instructions (Signed)
Peripheral Edema You have swelling in your legs (peripheral edema). This swelling is due to excess accumulation of salt and water in your body. Edema may be a sign of heart, kidney or liver disease, or a side effect of a medication. It may also be due to problems in the leg veins. Elevating your legs and using special support stockings may be very helpful, if the cause of the swelling is due to poor venous circulation. Avoid long periods of standing, whatever the cause. Treatment of edema depends on identifying the cause. Chips, pretzels, pickles and other salty foods should be avoided. Restricting salt in your diet is almost always needed. Water pills (diuretics) are often used to remove the excess salt and water from your body via urine. These medicines prevent the kidney from reabsorbing sodium. This increases urine flow. Diuretic treatment may also result in lowering of potassium levels in your body. Potassium supplements may be needed if you have to use diuretics daily. Daily weights can help you keep track of your progress in clearing your edema. You should call your caregiver for follow up care as recommended. SEEK IMMEDIATE MEDICAL CARE IF:   You have increased swelling, pain, redness, or heat in your legs.   You develop shortness of breath, especially when lying down.   You develop chest or abdominal pain, weakness, or fainting.   You have a fever.  Document Released: 06/27/2004 Document Revised: 05/09/2011 Document Reviewed: 06/07/2009 ExitCare Patient Information 2012 [employer]. 

## 2011-09-25 ENCOUNTER — Encounter (HOSPITAL_COMMUNITY): Payer: Self-pay | Admitting: *Deleted

## 2011-09-25 ENCOUNTER — Inpatient Hospital Stay (HOSPITAL_COMMUNITY)
Admission: AD | Admit: 2011-09-25 | Discharge: 2011-09-26 | Disposition: A | Discharge status: Home / Self Care | Payer: Medicaid Other | Source: Ambulatory Visit | Attending: Obstetrics & Gynecology | Admitting: Obstetrics & Gynecology

## 2011-09-25 DIAGNOSIS — R079 Chest pain, unspecified: Secondary | ICD-10-CM | POA: Insufficient documentation

## 2011-09-25 DIAGNOSIS — O135 Gestational [pregnancy-induced] hypertension without significant proteinuria, complicating the puerperium: Secondary | ICD-10-CM | POA: Insufficient documentation

## 2011-09-25 DIAGNOSIS — K219 Gastro-esophageal reflux disease without esophagitis: Secondary | ICD-10-CM | POA: Insufficient documentation

## 2011-09-25 DIAGNOSIS — O99893 Other specified diseases and conditions complicating puerperium: Secondary | ICD-10-CM | POA: Insufficient documentation

## 2011-09-25 LAB — URINALYSIS, ROUTINE W REFLEX MICROSCOPIC
Ketones, ur: 15 mg/dL — AB
Nitrite: NEGATIVE
pH: 6 (ref 5.0–8.0)

## 2011-09-25 LAB — URINE MICROSCOPIC-ADD ON

## 2011-09-25 MED ORDER — GI COCKTAIL ~~LOC~~
30.0000 mL | Freq: Once | ORAL | Status: AC
  Administered 2011-09-25: 30 mL via ORAL
  Filled 2011-09-25: qty 30

## 2011-09-25 NOTE — MAU Note (Signed)
N. Frazier, CNM at bedside.  Assessment done and poc discussed with pt.  

## 2011-09-25 NOTE — MAU Provider Note (Signed)
History     CSN: [id number]  Arrival date and time: 09/25/11 2222   None     Chief Complaint  Patient presents with  . Cough  . Chest Pain  . Dizziness   HPI 36 y.o. [id number] at 1 week postpartum. C/O mid chest tightness and "coughing up water" tonight. No difficulty breathing or chest pain. HTN with pregnancy and postpartum, taking Labetalol and Dyazide (started dyazide last night). Has history of GERD - takes Prilosec, stopped during admission, has restarted since being home.    Past Medical History  Diagnosis Date  . Eczema   . Hypertension     Past Surgical History  Procedure Date  . No past surgeries     Family History  Problem Relation Age of Onset  . Anesthesia problems Neg Hx   . Hypertension Mother   . Diabetes Maternal Grandmother   . Hypertension Maternal Grandmother     History  Substance Use Topics  . Smoking status: Former Games developer  . Smokeless tobacco: Not on file  . Alcohol Use: No    Allergies: No Known Allergies  Prescriptions prior to admission  Medication Sig Dispense Refill  . butalbital-acetaminophen-caffeine (FIORICET) 50-325-40 MG per tablet Take 1-2 tablets by mouth every 6 (six) hours as needed for headache.  20 tablet  1  . Cholecalciferol (VITAMIN D) 2000 UNITS CAPS Take 2,000 Units by mouth every morning.      . ferrous sulfate 325 (65 FE) MG tablet Take 1 tablet (325 mg total) by mouth 2 (two) times daily before a meal.  60 tablet  11  . ibuprofen (ADVIL,MOTRIN) 600 MG tablet Take 1 tablet (600 mg total) by mouth every 6 (six) hours as needed for pain.  30 tablet  1  . labetalol (NORMODYNE) 200 MG tablet Take 1 tablet (200 mg total) by mouth 2 (two) times daily.  30 tablet  1  . omeprazole (PRILOSEC) 20 MG capsule Take 20 mg by mouth 2 (two) times daily.      Marland Kitchen oxyCODONE-acetaminophen (PERCOCET) 5-325 MG per tablet Take 1-2 tablets by mouth every 6 (six) hours as needed (moderate - severe pain).  30 tablet  0  . Prenat w/o  A-FeCbn-DSS-FA-DHA (CITRANATAL 90 DHA PO) Take 1 tablet by mouth daily.      Marland Kitchen triamterene-hydrochlorothiazide (DYAZIDE) 37.5-25 MG per capsule Take 1 each (1 capsule total) by mouth every morning.  30 capsule  1  . norethindrone (MICRONOR,CAMILA,ERRIN) 0.35 MG tablet Take 1 tablet by mouth daily. Second Sunday start. Has not started taking plans to take Sunday 09/29/2011      . DISCONTD: norethindrone (ORTHO MICRONOR) 0.35 MG tablet Take 1 tablet (0.35 mg total) by mouth daily. Second Sunday start  28 tablet  11    Review of Systems  Constitutional: Negative.   Respiratory: Positive for cough. Negative for shortness of breath and wheezing.   Cardiovascular: Negative.  Negative for chest pain.  Gastrointestinal: Positive for heartburn. Negative for nausea, vomiting, abdominal pain, diarrhea and constipation.  Genitourinary: Negative for dysuria, urgency, frequency, hematuria and flank pain.  Musculoskeletal: Negative.   Neurological: Negative.   Psychiatric/Behavioral: Negative.    Physical Exam   Blood pressure 148/99, pulse 101, temperature 98 F (36.7 C), temperature source Oral, resp. rate 20, height 5' (1.524 m), weight 236 lb 9.6 oz (107.321 kg), SpO2 94.00%, currently breastfeeding.  Filed Vitals:   09/25/11 2238 09/25/11 2239 09/25/11 2338 09/26/11 0101  BP:  148/99 142/80 122/75  Pulse:  101  81 93  Temp:  98 F (36.7 C)    TempSrc:  Oral    Resp:  20 22 18   Height:  5' (1.524 m)    Weight:  236 lb 9.6 oz (107.321 kg)    SpO2: 94%       Physical Exam  Nursing note and vitals reviewed. Constitutional: She is oriented to person, place, and time. She appears well-developed and well-nourished. No distress.  Cardiovascular: Normal rate, regular rhythm and normal heart sounds.   Respiratory: Breath sounds normal. No respiratory distress. She has no wheezes.  GI: Soft. She exhibits no distension and no mass. There is no tenderness. There is no rebound and no guarding.    Musculoskeletal: Normal range of motion.  Neurological: She is alert and oriented to person, place, and time. She has normal reflexes. No cranial nerve deficit.  Skin: Skin is warm and dry.  Psychiatric: She has a normal mood and affect.    MAU Course  Procedures  Results for orders placed during the hospital encounter of 09/25/11 (from the past 24 hour(s))  URINALYSIS, ROUTINE W REFLEX MICROSCOPIC     Status: Abnormal   Collection Time   09/25/11 10:45 PM      Component Value Range   Color, Urine YELLOW  YELLOW    APPearance CLEAR  CLEAR    Specific Gravity, Urine 1.010  1.005 - 1.030    pH 6.0  5.0 - 8.0    Glucose, UA NEGATIVE  NEGATIVE (mg/dL)   Hgb urine dipstick MODERATE (*) NEGATIVE    Bilirubin Urine NEGATIVE  NEGATIVE    Ketones, ur 15 (*) NEGATIVE (mg/dL)   Protein, ur NEGATIVE  NEGATIVE (mg/dL)   Urobilinogen, UA 4.0 (*) 0.0 - 1.0 (mg/dL)   Nitrite NEGATIVE  NEGATIVE    Leukocytes, UA TRACE (*) NEGATIVE   URINE MICROSCOPIC-ADD ON     Status: Normal   Collection Time   09/25/11 10:45 PM      Component Value Range   Squamous Epithelial / LPF RARE  RARE    WBC, UA 0-2  <3 (WBC/hpf)   RBC / HPF 3-6  <3 (RBC/hpf)   Chest tightness improved with GI cocktail  Assessment and Plan  36 y.o. [id number] at 1 week PP GERD - continue Prilosec, may try pepcid PRN HTN - continue meds as prescribed F/U as scheduled or sooner PRN  Whitney White 09/25/2011, 11:25 PM

## 2011-09-25 NOTE — MAU Note (Signed)
Pt post partum vag delivery 4/17, having chest tightness and cough x 45 min.  Pt reports coughing up water this evening.

## 2013-04-09 ENCOUNTER — Emergency Department (HOSPITAL_COMMUNITY)
Admission: EM | Admit: 2013-04-09 | Discharge: 2013-04-09 | Disposition: A | Discharge status: Home / Self Care | Payer: Medicaid Other | Attending: Emergency Medicine | Admitting: Emergency Medicine

## 2013-04-09 ENCOUNTER — Emergency Department (HOSPITAL_COMMUNITY): Payer: Medicaid Other

## 2013-04-09 DIAGNOSIS — Y9389 Activity, other specified: Secondary | ICD-10-CM | POA: Insufficient documentation

## 2013-04-09 DIAGNOSIS — Z79899 Other long term (current) drug therapy: Secondary | ICD-10-CM | POA: Insufficient documentation

## 2013-04-09 DIAGNOSIS — Z872 Personal history of diseases of the skin and subcutaneous tissue: Secondary | ICD-10-CM | POA: Insufficient documentation

## 2013-04-09 DIAGNOSIS — Z87891 Personal history of nicotine dependence: Secondary | ICD-10-CM | POA: Insufficient documentation

## 2013-04-09 DIAGNOSIS — I10 Essential (primary) hypertension: Secondary | ICD-10-CM | POA: Insufficient documentation

## 2013-04-09 DIAGNOSIS — W108XXA Fall (on) (from) other stairs and steps, initial encounter: Secondary | ICD-10-CM | POA: Insufficient documentation

## 2013-04-09 DIAGNOSIS — Y929 Unspecified place or not applicable: Secondary | ICD-10-CM | POA: Insufficient documentation

## 2013-04-09 DIAGNOSIS — S8391XA Sprain of unspecified site of right knee, initial encounter: Secondary | ICD-10-CM

## 2013-04-09 DIAGNOSIS — IMO0002 Reserved for concepts with insufficient information to code with codable children: Secondary | ICD-10-CM | POA: Insufficient documentation

## 2013-04-09 MED ORDER — NAPROXEN 375 MG PO TABS: 375.0000 mg | ORAL_TABLET | Freq: Two times a day (BID) | ORAL | Status: DC

## 2013-04-09 NOTE — ED Provider Notes (Signed)
CSN: [id number]     Arrival date & time 04/09/13  1534 History  This chart was scribed for non-physician practitioner Raymon Mutton, PA-C working with Gavin Pound. Oletta Lamas, MD by Joaquin Music, ED Scribe. This patient was seen in room WTR5/WTR5 and the patient's care was started at 5:17 PM  Chief Complaint  Patient presents with  . Knee Pain    HPI HPI Comments: Whitney White is a 37 y.o. female who presents to the Emergency Department complaining of ongoing worsening R knee pain onset 3 days. Pt states she missed a step when going upstairs 3 days. Pt states she landed on her R knee. Pt reports swelling. She states the pain is throbbing. She states the pain is constant in her R knee but states the pain felt it was going up her thigh. She states pain worsens with weight bearing. She states she has taken Ibuprofen with some relief and has applied ice to area. Pt denies numbness, weakness, and tingling. Pt denies fever.  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 She states she wears work boots with good support.   Past Medical History  Diagnosis Date  . Eczema   . Hypertension    Past Surgical History  Procedure Laterality Date  . No past surgeries     Family History  Problem Relation Age of Onset  . Anesthesia problems Neg Hx   . Hypertension Mother   . Diabetes Maternal Grandmother   . Hypertension Maternal Grandmother    History  Substance Use Topics  . Smoking status: Former Games developer  . Smokeless tobacco: Not on file  . Alcohol Use: No   OB History   Grav Para Term Preterm Abortions TAB SAB Ect Mult Living   4 4 4  0 0 0 0 0 0 4     Review of Systems  Musculoskeletal: Positive for arthralgias. Negative for gait problem and myalgias.       Knee pain  Neurological: Negative for weakness and numbness.  All other systems reviewed and are negative.    Allergies  Review of patient's allergies indicates no known allergies.  Home Medications   Current Outpatient  Rx  Name  Route  Sig  Dispense  Refill  . acetaminophen (TYLENOL) 325 MG tablet   Oral   Take 650 mg by mouth every 6 (six) hours as needed (pain).         Marland Kitchen FLUoxetine (PROZAC) 20 MG capsule   Oral   Take 20 mg by mouth daily.         Marland Kitchen ibuprofen (ADVIL,MOTRIN) 200 MG tablet   Oral   Take 400 mg by mouth every 6 (six) hours as needed (pain).         . naproxen (NAPROSYN) 375 MG tablet   Oral   Take 1 tablet (375 mg total) by mouth 2 (two) times daily.   20 tablet   0    Triage Vitals:BP 130/67  Pulse 88  Temp(Src) 98.1 F (36.7 C) (Oral)  Resp 16  SpO2 95%  Physical Exam  Nursing note and vitals reviewed. Constitutional: She is oriented to person, place, and time. She appears well-developed and well-nourished. No distress.  Cardiovascular: Normal rate, regular rhythm and normal heart sounds.   Pulses:      Radial pulses are 2+ on the right side, and 2+ on the left side.       Dorsalis pedis pulses are 2+ on the right side, and 2+ on the left side.  Pulmonary/Chest: Effort normal and breath sounds normal. No respiratory distress. She has no wheezes. She has no rales.  Musculoskeletal: She exhibits tenderness.       Legs: Mild swelling localized the right knee. Negative erythema, warmth upon palpation, deformities noted to the right name. Discomfort upon palpation to the right knee-circumferential. Full flexion, discomfort with extension. Patella in place. Negative posterior and anterior draw sign. Negative valgus and various tension. Discomfort upon McMurray's sign. Stable knee joint. Full range of motion to the right ankle, right foot and digits of the right foot.  Neurological: She is alert and oriented to person, place, and time. She exhibits normal muscle tone. Coordination normal.  Strength 5+/5+ to lower extremities bilaterally with resistance applied, equal distribution identified   Skin: Skin is warm and dry. No rash noted. She is not diaphoretic. No erythema.   Psychiatric: She has a normal mood and affect. Her behavior is normal. Thought content normal.    ED Course  Procedures  DIAGNOSTIC STUDIES: Oxygen Saturation is 95% on RA, normal by my interpretation.    COORDINATION OF CARE: 5:24 PM-Discussed treatment plan which includes D/C pt with knee splint, advised pt to elevate knee and avoid strenuous work.  Discussed with pt at bedside and pt agreed to plan.   Labs Review Labs Reviewed - No data to display Imaging Review Dg Knee Complete 4 Views Right  04/09/2013   CLINICAL DATA:  Right knee pain. Fell 2 days ago.  Pain is worse.  EXAM: RIGHT KNEE - COMPLETE 4+ VIEW  COMPARISON:  None.  FINDINGS: No fracture. The knee joint is normally space and aligned. No joint effusion. The soft tissues are unremarkable.  IMPRESSION: Negative.   Electronically Signed   By: Amie Portland M.D.   On: 04/09/2013 16:37    EKG Interpretation   None       MDM   1. Knee sprain, right, initial encounter    Filed Vitals:   04/09/13 1549  BP: 130/67  Pulse: 88  Temp: 98.1 F (36.7 C)  TempSrc: Oral  Resp: 16  SpO2: 95%   I personally performed the services described in this documentation, which was scribed in my presence. The recorded information has been reviewed and is accurate.  Patient presenting to emergency department right knee pain has been ongoing for the past 3 days-patient reported that she was walking down the stairs she landed on her right knee resulting discomfort. Patient reports the pain to be a throbbing sensation that is constant without radiation-localized to the right knee, more likely posterior aspect. Alert and oriented. Mild swelling localized to the right knee. Negative erythema, warmth upon palpation. Discomfort upon palpation to the right knee, circumferential. Patient able to flex the knee, discomfort noted with extension. Negative posterior and anterior draw sign. Negative valgus and varus tension. Mild discomfort upon  McMurray's sign. Negative signs of effusion. Pulses palpable. Negative neurological deficits noted.  Right knee plain film noted-negative fracture, subluxation, effusion. Suspicion to be knee sprain. Doubt septic joint-negative effusion, negative warmth upon palpation, negative erythema, patient not diabetic. Patient placed in knee sleeve and crutches administered for discomfort. Patient stable, afebrile. Discharge patient naproxen. Referred patient to orthopedics. Discussed with patient to elevate, ice. Discussed with patient to closely monitor symptoms and if symptoms are to worsen or change report back to emergency department - strict return structures given.  Patient agreed to plan of care, understood, all questions answered.   Raymon Mutton, PA-C 04/11/13 1423

## 2013-04-09 NOTE — ED Notes (Signed)
Pt c/o R knee pain. States she fell on Wed. States pain is getting worse now. Pt has some swelling to R knee. ROM intact, but painful. Pt has taken Tylenol and Motrin for relief. Pt ambulatory to exam room with steady gait.

## 2013-04-09 NOTE — ED Notes (Signed)
Ortho tech called for application of knee sleeve and crutches.  

## 2013-04-11 NOTE — ED Provider Notes (Signed)
Medical screening examination/treatment/procedure(s) were performed by non-physician practitioner and as supervising physician I was immediately available for consultation/collaboration.  EKG Interpretation   None         Jowanna Loeffler Y. Daniell Mancinas, MD 04/11/13 2228 

## 2013-07-07 ENCOUNTER — Emergency Department (HOSPITAL_COMMUNITY): Payer: Medicaid Other

## 2013-07-07 ENCOUNTER — Encounter (HOSPITAL_COMMUNITY): Payer: Self-pay | Admitting: Emergency Medicine

## 2013-07-07 ENCOUNTER — Emergency Department (HOSPITAL_COMMUNITY)
Admission: EM | Admit: 2013-07-07 | Discharge: 2013-07-07 | Disposition: A | Discharge status: Home / Self Care | Payer: Medicaid Other | Attending: Emergency Medicine | Admitting: Emergency Medicine

## 2013-07-07 DIAGNOSIS — R0789 Other chest pain: Secondary | ICD-10-CM

## 2013-07-07 DIAGNOSIS — R358 Other polyuria: Secondary | ICD-10-CM | POA: Insufficient documentation

## 2013-07-07 DIAGNOSIS — R3589 Other polyuria: Secondary | ICD-10-CM | POA: Insufficient documentation

## 2013-07-07 DIAGNOSIS — R42 Dizziness and giddiness: Secondary | ICD-10-CM | POA: Insufficient documentation

## 2013-07-07 DIAGNOSIS — I10 Essential (primary) hypertension: Secondary | ICD-10-CM | POA: Insufficient documentation

## 2013-07-07 DIAGNOSIS — Z79899 Other long term (current) drug therapy: Secondary | ICD-10-CM | POA: Insufficient documentation

## 2013-07-07 DIAGNOSIS — R110 Nausea: Secondary | ICD-10-CM | POA: Insufficient documentation

## 2013-07-07 DIAGNOSIS — Z87891 Personal history of nicotine dependence: Secondary | ICD-10-CM | POA: Insufficient documentation

## 2013-07-07 DIAGNOSIS — Z3202 Encounter for pregnancy test, result negative: Secondary | ICD-10-CM | POA: Insufficient documentation

## 2013-07-07 DIAGNOSIS — R071 Chest pain on breathing: Secondary | ICD-10-CM | POA: Insufficient documentation

## 2013-07-07 LAB — CBC WITH DIFFERENTIAL/PLATELET
BASOS ABS: 0 10*3/uL (ref 0.0–0.1)
Basophils Relative: 0 % (ref 0–1)
Eosinophils Absolute: 0.2 10*3/uL (ref 0.0–0.7)
Eosinophils Relative: 2 % (ref 0–5)
HCT: 42.2 % (ref 36.0–46.0)
Hemoglobin: 13.5 g/dL (ref 12.0–15.0)
Lymphocytes Relative: 39 % (ref 12–46)
Lymphs Abs: 3.8 10*3/uL (ref 0.7–4.0)
MCH: 25 pg — ABNORMAL LOW (ref 26.0–34.0)
MCHC: 32 g/dL (ref 30.0–36.0)
MCV: 78.3 fL (ref 78.0–100.0)
Monocytes Absolute: 0.5 10*3/uL (ref 0.1–1.0)
Monocytes Relative: 5 % (ref 3–12)
Neutro Abs: 5.3 10*3/uL (ref 1.7–7.7)
Neutrophils Relative %: 54 % (ref 43–77)
Platelets: 311 10*3/uL (ref 150–400)
RBC: 5.39 MIL/uL — ABNORMAL HIGH (ref 3.87–5.11)
RDW: 14.1 % (ref 11.5–15.5)
WBC: 9.8 10*3/uL (ref 4.0–10.5)

## 2013-07-07 LAB — PREGNANCY, URINE: Preg Test, Ur: NEGATIVE

## 2013-07-07 LAB — URINALYSIS, ROUTINE W REFLEX MICROSCOPIC
Bilirubin Urine: NEGATIVE
Glucose, UA: NEGATIVE mg/dL
Hgb urine dipstick: NEGATIVE
Ketones, ur: NEGATIVE mg/dL
NITRITE: NEGATIVE
PROTEIN: NEGATIVE mg/dL
Specific Gravity, Urine: 1.021 (ref 1.005–1.030)
UROBILINOGEN UA: 1 mg/dL (ref 0.0–1.0)
pH: 7 (ref 5.0–8.0)

## 2013-07-07 LAB — TROPONIN I: Troponin I: <0.3 ng/mL (ref <0.30)

## 2013-07-07 LAB — COMPREHENSIVE METABOLIC PANEL
ALBUMIN: 4.4 g/dL (ref 3.5–5.2)
ALT: 13 U/L (ref 0–35)
AST: 17 U/L (ref 0–37)
Alkaline Phosphatase: 42 U/L (ref 39–117)
BILIRUBIN TOTAL: 0.5 mg/dL (ref 0.3–1.2)
BUN: 12 mg/dL (ref 6–23)
CO2: 27 meq/L (ref 19–32)
CREATININE: 0.72 mg/dL (ref 0.50–1.10)
Calcium: 9.5 mg/dL (ref 8.4–10.5)
Chloride: 99 mEq/L (ref 96–112)
Glucose, Bld: 79 mg/dL (ref 70–99)
Potassium: 4.3 mEq/L (ref 3.7–5.3)
Sodium: 139 mEq/L (ref 137–147)
Total Protein: 8.4 g/dL — ABNORMAL HIGH (ref 6.0–8.3)

## 2013-07-07 LAB — URINE MICROSCOPIC-ADD ON

## 2013-07-07 LAB — POCT I-STAT TROPONIN I: Troponin i, poc: 0.01 ng/mL (ref 0.00–0.08)

## 2013-07-07 NOTE — Discharge Instructions (Signed)
Chest Pain (Nonspecific) °It is often hard to give a specific diagnosis for the cause of chest pain. There is always a chance that your pain could be related to something serious, such as a heart attack or a blood clot in the lungs. You need to follow up with your caregiver for further evaluation. °CAUSES  °· Heartburn. °· Pneumonia or bronchitis. °· Anxiety or stress. °· Inflammation around your heart (pericarditis) or lung (pleuritis or pleurisy). °· A blood clot in the lung. °· A collapsed lung (pneumothorax). It can develop suddenly on its own (spontaneous pneumothorax) or from injury (trauma) to the chest. °· Shingles infection (herpes zoster virus). °The chest wall is composed of bones, muscles, and cartilage. Any of these can be the source of the pain. °· The bones can be bruised by injury. °· The muscles or cartilage can be strained by coughing or overwork. °· The cartilage can be affected by inflammation and become sore (costochondritis). °DIAGNOSIS  °Lab tests or other studies, such as X-rays, electrocardiography, stress testing, or cardiac imaging, may be needed to find the cause of your pain.  °TREATMENT  °· Treatment depends on what may be causing your chest pain. Treatment may include: °· Acid blockers for heartburn. °· Anti-inflammatory medicine. °· Pain medicine for inflammatory conditions. °· Antibiotics if an infection is present. °· You may be advised to change lifestyle habits. This includes stopping smoking and avoiding alcohol, caffeine, and chocolate. °· You may be advised to keep your head raised (elevated) when sleeping. This reduces the chance of acid going backward from your stomach into your esophagus. °· Most of the time, nonspecific chest pain will improve within 2 to 3 days with rest and mild pain medicine. °HOME CARE INSTRUCTIONS  °· If antibiotics were prescribed, take your antibiotics as directed. Finish them even if you start to feel better. °· For the next few days, avoid physical  activities that bring on chest pain. Continue physical activities as directed. °· Do not smoke. °· Avoid drinking alcohol. °· Only take over-the-counter or prescription medicine for pain, discomfort, or fever as directed by your caregiver. °· Follow your caregiver's suggestions for further testing if your chest pain does not go away. °· Keep any follow-up appointments you made. If you do not go to an appointment, you could develop lasting (chronic) problems with pain. If there is any problem keeping an appointment, you must call to reschedule. °SEEK MEDICAL CARE IF:  °· You think you are having problems from the medicine you are taking. Read your medicine instructions carefully. °· Your chest pain does not go away, even after treatment. °· You develop a rash with blisters on your chest. °SEEK IMMEDIATE MEDICAL CARE IF:  °· You have increased chest pain or pain that spreads to your arm, neck, jaw, back, or abdomen. °· You develop shortness of breath, an increasing cough, or you are coughing up blood. °· You have severe back or abdominal pain, feel nauseous, or vomit. °· You develop severe weakness, fainting, or chills. °· You have a fever. °THIS IS AN EMERGENCY. Do not wait to see if the pain will go away. Get medical help at once. Call your local emergency services (911 in [place] Do not drive yourself to the hospital. °MAKE SURE YOU:  °· Understand these instructions. °· Will watch your condition. °· Will get help right away if you are not doing well or get worse. °Document Released: 02/27/2005 Document Revised: 08/12/2011 Document Reviewed: 12/24/2007 °Exit[employer]® Patient Information ©2014 [employer]   [employer]. ° °

## 2013-07-07 NOTE — ED Notes (Signed)
The pt had some lt chest pain while shopping in [employer] today followed by a headache .  Nausea for awhile and dizziness.  She has also had  Frequent urination.  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

## 2013-07-07 NOTE — ED Notes (Signed)
MD at bedside. 

## 2013-07-07 NOTE — ED Notes (Signed)
Denies CP at this time. Reports HA since driving home from [employer] today. Requesting tylenol for the pain. Pt reports intermittent "sensation" in left arm and chest for the past few days.

## 2013-07-07 NOTE — ED Provider Notes (Signed)
CSN: [id number]     Arrival date & time 07/07/13  [id number] History   First MD Initiated Contact with Patient 07/07/13 1919     Chief Complaint  Patient presents with  . multiple complaints     HPI: [name] is a 38 yo F with history of eczema, who presents with chest pain, frequent urination and dizziness. Symptoms started while driving today, she had left sided chest pain. Described as sharp, non-radiating, lasted for a few minutes then resolved spontaneously. Pain was not pleuritic. No alleviating or exacerbating factors. She denies nausea, vomiting, SOB or diaphoresis during episode of chest pain. No exertional chest pain or SOB recently.  She had similar pain two days ago but did not seek care at that time. She does endorse intermittent dizziness and nausea for the last week. She thought she may be pregnant but her home pregnancy test was negative. She has no risk factors for VTE. No history of CAD.    Past Medical History  Diagnosis Date  . Eczema   . Hypertension    Past Surgical History  Procedure Laterality Date  . No past surgeries     Family History  Problem Relation Age of Onset  . Anesthesia problems Neg Hx   . Hypertension Mother   . Diabetes Maternal Grandmother   . Hypertension Maternal Grandmother    History  Substance Use Topics  . Smoking status: Former Games developermoker  . Smokeless tobacco: Not on file  . Alcohol Use: No   OB History   Grav Para Term Preterm Abortions TAB SAB Ect Mult Living   4 4 4  0 0 0 0 0 0 4     Review of Systems  Constitutional: Negative for fever, chills, appetite change and fatigue.  Eyes: Negative for photophobia and visual disturbance.  Respiratory: Negative for cough and shortness of breath.   Cardiovascular: Positive for chest pain. Negative for leg swelling.  Gastrointestinal: Positive for nausea. Negative for vomiting, abdominal pain, diarrhea and constipation.  Genitourinary: Positive for frequency. Negative for dysuria and decreased  urine volume.  Musculoskeletal: Negative for arthralgias, back pain, gait problem and myalgias.  Skin: Negative for color change and wound.  Neurological: Positive for dizziness. Negative for syncope, light-headedness and headaches.  Psychiatric/Behavioral: Negative for confusion and agitation.  All other systems reviewed and are negative.    Allergies  Review of patient's allergies indicates no known allergies.  Home Medications   Current Outpatient Rx  Name  Route  Sig  Dispense  Refill  . FLUoxetine (PROZAC) 20 MG capsule   Oral   Take 20 mg by mouth daily.          BP 122/75  Pulse 89  Temp(Src) 98.5 F (36.9 C) (Oral)  Resp 18  Ht 5' (1.524 m)  Wt 220 lb (99.791 kg)  BMI 42.97 kg/m2  SpO2 98% Physical Exam  Nursing note and vitals reviewed. Constitutional: She is oriented to person, place, and time. She appears well-developed and well-nourished. No distress.  HENT:  Head: Normocephalic and atraumatic.  Mouth/Throat: Oropharynx is clear and moist.  Eyes: Conjunctivae and EOM are normal. Pupils are equal, round, and reactive to light.  Neck: Normal range of motion. Neck supple.  Cardiovascular: Normal rate, regular rhythm, normal heart sounds and intact distal pulses.   Pulmonary/Chest: Effort normal and breath sounds normal. No respiratory distress.  TTP over left chest wall   Abdominal: Soft. Bowel sounds are normal. There is no tenderness. There is no rebound  and no guarding.  Musculoskeletal: Normal range of motion. She exhibits no edema and no tenderness.  Neurological: She is alert and oriented to person, place, and time. No cranial nerve deficit. Coordination normal.  Skin: Skin is warm and dry. No rash noted.  Psychiatric: She has a normal mood and affect. Her behavior is normal.    ED Course  Procedures (including critical care time) Labs Review Labs Reviewed  CBC WITH DIFFERENTIAL - Abnormal; Notable for the following:    RBC 5.39 (*)    MCH 25.0  (*)    All other components within normal limits  COMPREHENSIVE METABOLIC PANEL - Abnormal; Notable for the following:    Total Protein 8.4 (*)    All other components within normal limits  URINALYSIS, ROUTINE W REFLEX MICROSCOPIC - Abnormal; Notable for the following:    APPearance CLOUDY (*)    Leukocytes, UA LARGE (*)    All other components within normal limits  URINE MICROSCOPIC-ADD ON - Abnormal; Notable for the following:    Squamous Epithelial / LPF MANY (*)    Bacteria, UA FEW (*)    All other components within normal limits  URINE CULTURE  TROPONIN I  PREGNANCY, URINE  POCT I-STAT TROPONIN I   Imaging Review Dg Chest 2 View  07/07/2013   CLINICAL DATA:  Left chest pain  EXAM: CHEST - 2 VIEW  COMPARISON:  03/07/2011  FINDINGS: Lungs are clear. Heart size and mediastinal contours are within normal limits. No effusion. Visualized skeletal structures are unremarkable.  IMPRESSION: No acute cardiopulmonary disease.   Electronically Signed   By: Oley Balm M.D.   On: 07/07/2013 21:40    EKG Interpretation    Date/Time:  Wednesday July 07 2013 17:26:24 EST Ventricular Rate:  76 PR Interval:  154 QRS Duration: 98 QT Interval:  372 QTC Calculation: 418 R Axis:   71 Text Interpretation:  Normal sinus rhythm Nonspecific T wave abnormality Abnormal ECG No significant change since last tracing Confirmed by KNAPP  MD-J, JON ([id number]) on 07/07/2013 10:03:20 PM            MDM   38 yo F with history of eczema who presents for evaluation of atypical chest pain. Afebrile, vital signs stable. Pain reproducible on exam. Low risk by Well's, PERC negative. Doubt ACS as her ECG is without ischemic changes, negative troponin X 2, atypical history. She has a HEART score of 2, appropriate for delta troponin which was done, both negative. Labs unremarkable. UA with 3-6 WBC's but many epi cell, appears contaminated. CXR without acute finding, doubt PTX or PNA. Chest pain likely MSK in  nature. She does not currently have a physician, given resources to obtain a PCP. Felt she was stable for outpatient management as her pain resolved prior to arrival, no recurrence of pain, HD stable, benign w/u. She was given return precautions. Patient in agreement with plan, will make an appointment with PCP  Reviewed imaging, labs, ECG and previous medical records, utilized in MDM  Discussed case with Dr. Lynelle Doctor  Clinical Impression 1. Chest wall pain.     Margie Billet, MD 07/08/13 [phone]

## 2013-07-08 LAB — URINE CULTURE

## 2013-07-09 NOTE — ED Provider Notes (Signed)
I reviewed the resident's note and I agree with the findings and plan.  EKG Interpretation    Date/Time:  Wednesday July 07 2013 17:26:24 EST Ventricular Rate:  76 PR Interval:  154 QRS Duration: 98 QT Interval:  372 QTC Calculation: 418 R Axis:   71 Text Interpretation:  Normal sinus rhythm Nonspecific T wave abnormality Abnormal ECG No significant change since last tracing Confirmed by Hairo Garraway  MD-J, Madaleine Simmon ([id number]) on 07/07/2013 10:03:20 PM           Pt presented to the ED with atypical chest pain  Low risk for cardiac etiology.  Low risk for PE, PERC negative.  there did not appear to be any evidence of an acute emergency medical condition and the patient appeared stable for discharge with appropriate outpatient follow up.    Celene KrasJon R Nury Nebergall, MD 07/09/13 [phone]

## 2013-12-12 ENCOUNTER — Emergency Department (HOSPITAL_COMMUNITY)
Admission: EM | Admit: 2013-12-12 | Discharge: 2013-12-12 | Disposition: A | Discharge status: Home / Self Care | Payer: Medicaid Other | Attending: Emergency Medicine | Admitting: Emergency Medicine

## 2013-12-12 ENCOUNTER — Encounter (HOSPITAL_COMMUNITY): Payer: Self-pay | Admitting: Emergency Medicine

## 2013-12-12 ENCOUNTER — Emergency Department (HOSPITAL_COMMUNITY): Payer: Medicaid Other

## 2013-12-12 DIAGNOSIS — I10 Essential (primary) hypertension: Secondary | ICD-10-CM | POA: Diagnosis not present

## 2013-12-12 DIAGNOSIS — Z87891 Personal history of nicotine dependence: Secondary | ICD-10-CM | POA: Insufficient documentation

## 2013-12-12 DIAGNOSIS — R079 Chest pain, unspecified: Secondary | ICD-10-CM | POA: Diagnosis not present

## 2013-12-12 DIAGNOSIS — Z79899 Other long term (current) drug therapy: Secondary | ICD-10-CM | POA: Diagnosis not present

## 2013-12-12 DIAGNOSIS — Z872 Personal history of diseases of the skin and subcutaneous tissue: Secondary | ICD-10-CM | POA: Diagnosis not present

## 2013-12-12 LAB — CBC WITH DIFFERENTIAL/PLATELET
BASOS PCT: 0 % (ref 0–1)
Basophils Absolute: 0 10*3/uL (ref 0.0–0.1)
EOS ABS: 0.2 10*3/uL (ref 0.0–0.7)
EOS PCT: 2 % (ref 0–5)
HCT: 39.3 % (ref 36.0–46.0)
Hemoglobin: 12.6 g/dL (ref 12.0–15.0)
Lymphocytes Relative: 47 % — ABNORMAL HIGH (ref 12–46)
Lymphs Abs: 4.2 10*3/uL — ABNORMAL HIGH (ref 0.7–4.0)
MCH: 24.9 pg — AB (ref 26.0–34.0)
MCHC: 32.1 g/dL (ref 30.0–36.0)
MCV: 77.5 fL — AB (ref 78.0–100.0)
MONOS PCT: 5 % (ref 3–12)
Monocytes Absolute: 0.5 10*3/uL (ref 0.1–1.0)
NEUTROS PCT: 46 % (ref 43–77)
Neutro Abs: 4.2 10*3/uL (ref 1.7–7.7)
Platelets: 310 10*3/uL (ref 150–400)
RBC: 5.07 MIL/uL (ref 3.87–5.11)
RDW: 13.4 % (ref 11.5–15.5)
WBC: 9 10*3/uL (ref 4.0–10.5)

## 2013-12-12 LAB — COMPREHENSIVE METABOLIC PANEL
ALT: 13 U/L (ref 0–35)
ANION GAP: 14 (ref 5–15)
AST: 17 U/L (ref 0–37)
Albumin: 4.3 g/dL (ref 3.5–5.2)
Alkaline Phosphatase: 49 U/L (ref 39–117)
BUN: 10 mg/dL (ref 6–23)
CO2: 27 mEq/L (ref 19–32)
Calcium: 9.2 mg/dL (ref 8.4–10.5)
Chloride: 100 mEq/L (ref 96–112)
Creatinine, Ser: 0.76 mg/dL (ref 0.50–1.10)
GFR calc non Af Amer: >90 mL/min (ref >90)
GLUCOSE: 93 mg/dL (ref 70–99)
POTASSIUM: 3.7 meq/L (ref 3.7–5.3)
Sodium: 141 mEq/L (ref 137–147)
TOTAL PROTEIN: 8.1 g/dL (ref 6.0–8.3)
Total Bilirubin: 0.5 mg/dL (ref 0.3–1.2)

## 2013-12-12 LAB — TROPONIN I

## 2013-12-12 NOTE — ED Notes (Signed)
The pt has also had bi-lateral inner wrist pain intermittently

## 2013-12-12 NOTE — Discharge Instructions (Signed)

## 2013-12-12 NOTE — ED Provider Notes (Signed)
CSN: [id number]     Arrival date & time 12/12/13  0048 History   First MD Initiated Contact with Patient 12/12/13 [phone]     Chief Complaint  Patient presents with  . Chest Pain     Patient is a 38 y.o. female presenting with chest pain. The history is provided by the patient.  Chest Pain Pain location:  L chest Pain quality: sharp   Pain radiates to:  Does not radiate Pain severity:  Mild Onset quality:  Gradual Timing:  Intermittent Progression:  Resolved Relieved by:  Nothing Worsened by:  Coughing and deep breathing Associated symptoms: no abdominal pain, no cough, no dizziness, no fever, no lower extremity edema, no shortness of breath, no syncope and no weakness   Pt presents for left sided CP for past 2 days.  Worse with cough/deep breathing No sob/dizziness It is now resolved No h/o any cardiac problems in the past No h/o DVT/PE    Past Medical History  Diagnosis Date  . Eczema   . Hypertension    Past Surgical History  Procedure Laterality Date  . No past surgeries     Family History  Problem Relation Age of Onset  . Anesthesia problems Neg Hx   . Hypertension Mother   . Diabetes Maternal Grandmother   . Hypertension Maternal Grandmother    History  Substance Use Topics  . Smoking status: Former Games developermoker  . Smokeless tobacco: Not on file  . Alcohol Use: No   OB History   Grav Para Term Preterm Abortions TAB SAB Ect Mult Living   4 4 4  0 0 0 0 0 0 4     Review of Systems  Constitutional: Negative for fever.  Respiratory: Negative for cough and shortness of breath.        Denies hemoptysis   Cardiovascular: Positive for chest pain. Negative for syncope.  Gastrointestinal: Negative for abdominal pain.  Neurological: Negative for dizziness, syncope and weakness.  All other systems reviewed and are negative.     Allergies  Review of patient's allergies indicates no known allergies.  Home Medications   Prior to Admission medications    Medication Sig Start Date End Date Taking? Authorizing Provider  FLUoxetine (PROZAC) 20 MG capsule Take 20 mg by mouth daily.   Yes Historical Provider, MD  hydrOXYzine (ATARAX/VISTARIL) 50 MG tablet Take 50 mg by mouth at bedtime as needed (sleep).   Yes Historical Provider, MD  levonorgestrel (MIRENA) 20 MCG/24HR IUD 1 each by Intrauterine route once.   Yes Historical Provider, MD   BP 123/86  Pulse 79  Temp(Src) 98.2 F (36.8 C) (Oral)  Resp 28  Wt 222 lb (100.699 kg)  SpO2 100% Physical Exam CONSTITUTIONAL: Well developed/well nourished HEAD: Normocephalic/atraumatic EYES: EOMI ENMT: Mucous membranes moist NECK: supple no meningeal signs SPINE:entire spine nontender CV: S1/S2 noted, no murmurs/rubs/gallops noted LUNGS: Lungs are clear to auscultation bilaterally, no apparent distress ABDOMEN: soft, nontender, no rebound or guarding GU:no cva tenderness NEURO: Pt is awake/alert, moves all extremitiesx4 EXTREMITIES: pulses normal, full ROM, no LE edema/tenderness noted SKIN: warm, color normal PSYCH: no abnormalities of mood noted  ED Course  Procedures   Pt well appearing, no distress, resting comfortably, no CP on my evaluation, denied any other associated symptoms I doubt ACS/PE/Dissection She is well appearing/stable for d/c home Discussed need for f/u  Strict return precautions discussed  Labs Review Labs Reviewed  CBC WITH DIFFERENTIAL - Abnormal; Notable for the following:    MCV 77.5 (*)  MCH 24.9 (*)    Lymphocytes Relative 47 (*)    Lymphs Abs 4.2 (*)    All other components within normal limits  COMPREHENSIVE METABOLIC PANEL  TROPONIN I    Imaging Review Dg Chest 2 View  12/12/2013   CLINICAL DATA:  Left-sided chest pain and headache for 2 days. Congestion.  EXAM: CHEST  2 VIEW  COMPARISON:  07/07/2013  FINDINGS: The heart size and mediastinal contours are within normal limits. Both lungs are clear. The visualized skeletal structures are  unremarkable.  IMPRESSION: No active cardiopulmonary disease.   Electronically Signed   By: Burman Nieves M.D.   On: 12/12/2013 01:57     EKG Interpretation   Date/Time:  Sunday December 12 2013 01:02:23 EDT Ventricular Rate:  75 PR Interval:  164 QRS Duration: 98 QT Interval:  372 QTC Calculation: 415 R Axis:   68 Text Interpretation:  Normal sinus rhythm Nonspecific T wave abnormality  Abnormal ECG No significant change since last tracing in february 2015  Confirmed by Bebe Shaggy  MD, Dorinda Hill ([id number]) on 12/12/2013 5:17:30 AM      MDM   Final diagnoses:  Chest pain, unspecified chest pain type    Nursing notes including past medical history and social history reviewed and considered in documentation xrays reviewed and considered Labs/vital reviewed and considered     Joya Gaskins, MD 12/12/13 [phone]

## 2013-12-12 NOTE — ED Notes (Signed)
MD at bedside. 

## 2013-12-12 NOTE — ED Notes (Signed)
The pt is c/o central chest pain for 2 days and she has also had a headache for 2 days intermittently.  No distress at present

## 2013-12-16 ENCOUNTER — Observation Stay (HOSPITAL_COMMUNITY)
Admission: EM | Admit: 2013-12-16 | Discharge: 2013-12-17 | Disposition: A | Discharge status: Home / Self Care | Payer: Medicaid Other | Attending: Internal Medicine | Admitting: Internal Medicine

## 2013-12-16 ENCOUNTER — Encounter (HOSPITAL_COMMUNITY): Payer: Self-pay | Admitting: Emergency Medicine

## 2013-12-16 ENCOUNTER — Emergency Department (HOSPITAL_COMMUNITY): Payer: Medicaid Other

## 2013-12-16 DIAGNOSIS — O139 Gestational [pregnancy-induced] hypertension without significant proteinuria, unspecified trimester: Secondary | ICD-10-CM | POA: Diagnosis present

## 2013-12-16 DIAGNOSIS — R1013 Epigastric pain: Secondary | ICD-10-CM | POA: Diagnosis not present

## 2013-12-16 DIAGNOSIS — R079 Chest pain, unspecified: Secondary | ICD-10-CM | POA: Diagnosis present

## 2013-12-16 DIAGNOSIS — Z79899 Other long term (current) drug therapy: Secondary | ICD-10-CM | POA: Insufficient documentation

## 2013-12-16 DIAGNOSIS — Z872 Personal history of diseases of the skin and subcutaneous tissue: Secondary | ICD-10-CM | POA: Diagnosis not present

## 2013-12-16 DIAGNOSIS — F32A Depression, unspecified: Secondary | ICD-10-CM | POA: Diagnosis present

## 2013-12-16 DIAGNOSIS — L309 Dermatitis, unspecified: Secondary | ICD-10-CM | POA: Diagnosis present

## 2013-12-16 DIAGNOSIS — Z87891 Personal history of nicotine dependence: Secondary | ICD-10-CM | POA: Diagnosis not present

## 2013-12-16 DIAGNOSIS — I10 Essential (primary) hypertension: Secondary | ICD-10-CM | POA: Diagnosis present

## 2013-12-16 DIAGNOSIS — R0789 Other chest pain: Principal | ICD-10-CM | POA: Insufficient documentation

## 2013-12-16 DIAGNOSIS — F329 Major depressive disorder, single episode, unspecified: Secondary | ICD-10-CM | POA: Diagnosis not present

## 2013-12-16 DIAGNOSIS — F3289 Other specified depressive episodes: Secondary | ICD-10-CM | POA: Insufficient documentation

## 2013-12-16 DIAGNOSIS — K219 Gastro-esophageal reflux disease without esophagitis: Secondary | ICD-10-CM | POA: Diagnosis present

## 2013-12-16 HISTORY — DX: Gestational (pregnancy-induced) hypertension without significant proteinuria, unspecified trimester: O13.9

## 2013-12-16 HISTORY — DX: Gastro-esophageal reflux disease without esophagitis: K21.9

## 2013-12-16 HISTORY — DX: Depression, unspecified: F32.A

## 2013-12-16 HISTORY — DX: Major depressive disorder, single episode, unspecified: F32.9

## 2013-12-16 LAB — COMPREHENSIVE METABOLIC PANEL
ALBUMIN: 4.3 g/dL (ref 3.5–5.2)
ALT: 14 U/L (ref 0–35)
AST: 18 U/L (ref 0–37)
Alkaline Phosphatase: 50 U/L (ref 39–117)
Anion gap: 16 — ABNORMAL HIGH (ref 5–15)
BUN: 10 mg/dL (ref 6–23)
CALCIUM: 9.2 mg/dL (ref 8.4–10.5)
CO2: 26 mEq/L (ref 19–32)
CREATININE: 0.78 mg/dL (ref 0.50–1.10)
Chloride: 98 mEq/L (ref 96–112)
GFR calc Af Amer: >90 mL/min (ref >90)
GFR calc non Af Amer: >90 mL/min (ref >90)
Glucose, Bld: 113 mg/dL — ABNORMAL HIGH (ref 70–99)
Potassium: 3.5 mEq/L — ABNORMAL LOW (ref 3.7–5.3)
Sodium: 140 mEq/L (ref 137–147)
TOTAL PROTEIN: 7.9 g/dL (ref 6.0–8.3)
Total Bilirubin: 0.6 mg/dL (ref 0.3–1.2)

## 2013-12-16 LAB — CBC WITH DIFFERENTIAL/PLATELET
Basophils Absolute: 0 10*3/uL (ref 0.0–0.1)
Basophils Relative: 0 % (ref 0–1)
EOS ABS: 0.2 10*3/uL (ref 0.0–0.7)
EOS PCT: 2 % (ref 0–5)
HEMATOCRIT: 40.3 % (ref 36.0–46.0)
HEMOGLOBIN: 12.7 g/dL (ref 12.0–15.0)
Lymphocytes Relative: 45 % (ref 12–46)
Lymphs Abs: 4.4 10*3/uL — ABNORMAL HIGH (ref 0.7–4.0)
MCH: 25 pg — AB (ref 26.0–34.0)
MCHC: 31.5 g/dL (ref 30.0–36.0)
MCV: 79.2 fL (ref 78.0–100.0)
MONOS PCT: 6 % (ref 3–12)
Monocytes Absolute: 0.6 10*3/uL (ref 0.1–1.0)
Neutro Abs: 4.7 10*3/uL (ref 1.7–7.7)
Neutrophils Relative %: 47 % (ref 43–77)
Platelets: 325 10*3/uL (ref 150–400)
RBC: 5.09 MIL/uL (ref 3.87–5.11)
RDW: 13.4 % (ref 11.5–15.5)
WBC: 9.9 10*3/uL (ref 4.0–10.5)

## 2013-12-16 LAB — I-STAT TROPONIN, ED: TROPONIN I, POC: 0 ng/mL (ref 0.00–0.08)

## 2013-12-16 NOTE — ED Notes (Addendum)
Pt reports bilateral arm pain, leg pain, epigastric pain, and side pain. PT was evaluated here a few days ago for chest pain. No sign findings. Was told anxiety. Takes antidepressants and states she still seems anxious. Pt reports SOB and diaphoresis. Pt states she tried to drink water but it seemed difficult like she couldn't get it down.

## 2013-12-17 ENCOUNTER — Encounter (HOSPITAL_COMMUNITY): Payer: Self-pay | Admitting: Emergency Medicine

## 2013-12-17 DIAGNOSIS — R079 Chest pain, unspecified: Secondary | ICD-10-CM

## 2013-12-17 DIAGNOSIS — F32A Depression, unspecified: Secondary | ICD-10-CM | POA: Diagnosis present

## 2013-12-17 DIAGNOSIS — F329 Major depressive disorder, single episode, unspecified: Secondary | ICD-10-CM

## 2013-12-17 DIAGNOSIS — O139 Gestational [pregnancy-induced] hypertension without significant proteinuria, unspecified trimester: Secondary | ICD-10-CM | POA: Diagnosis present

## 2013-12-17 DIAGNOSIS — L259 Unspecified contact dermatitis, unspecified cause: Secondary | ICD-10-CM

## 2013-12-17 DIAGNOSIS — R072 Precordial pain: Secondary | ICD-10-CM

## 2013-12-17 DIAGNOSIS — L309 Dermatitis, unspecified: Secondary | ICD-10-CM | POA: Diagnosis present

## 2013-12-17 DIAGNOSIS — K219 Gastro-esophageal reflux disease without esophagitis: Secondary | ICD-10-CM | POA: Diagnosis present

## 2013-12-17 DIAGNOSIS — I10 Essential (primary) hypertension: Secondary | ICD-10-CM

## 2013-12-17 DIAGNOSIS — F3289 Other specified depressive episodes: Secondary | ICD-10-CM

## 2013-12-17 LAB — COMPREHENSIVE METABOLIC PANEL
ALBUMIN: 4.3 g/dL (ref 3.5–5.2)
ALK PHOS: 52 U/L (ref 39–117)
ALT: 12 U/L (ref 0–35)
AST: 15 U/L (ref 0–37)
Anion gap: 13 (ref 5–15)
BUN: 8 mg/dL (ref 6–23)
CHLORIDE: 100 meq/L (ref 96–112)
CO2: 28 mEq/L (ref 19–32)
CREATININE: 0.74 mg/dL (ref 0.50–1.10)
Calcium: 9.3 mg/dL (ref 8.4–10.5)
GFR calc Af Amer: >90 mL/min (ref >90)
GFR calc non Af Amer: >90 mL/min (ref >90)
Glucose, Bld: 90 mg/dL (ref 70–99)
POTASSIUM: 4 meq/L (ref 3.7–5.3)
SODIUM: 141 meq/L (ref 137–147)
Total Bilirubin: 0.9 mg/dL (ref 0.3–1.2)
Total Protein: 8.1 g/dL (ref 6.0–8.3)

## 2013-12-17 LAB — CBC WITH DIFFERENTIAL/PLATELET
BASOS PCT: 0 % (ref 0–1)
Basophils Absolute: 0 10*3/uL (ref 0.0–0.1)
Eosinophils Absolute: 0.1 10*3/uL (ref 0.0–0.7)
Eosinophils Relative: 1 % (ref 0–5)
HEMATOCRIT: 42.1 % (ref 36.0–46.0)
HEMOGLOBIN: 13.1 g/dL (ref 12.0–15.0)
Lymphocytes Relative: 37 % (ref 12–46)
Lymphs Abs: 3 10*3/uL (ref 0.7–4.0)
MCH: 24.8 pg — AB (ref 26.0–34.0)
MCHC: 31.1 g/dL (ref 30.0–36.0)
MCV: 79.7 fL (ref 78.0–100.0)
Monocytes Absolute: 0.5 10*3/uL (ref 0.1–1.0)
Monocytes Relative: 6 % (ref 3–12)
NEUTROS ABS: 4.6 10*3/uL (ref 1.7–7.7)
NEUTROS PCT: 56 % (ref 43–77)
Platelets: 311 10*3/uL (ref 150–400)
RBC: 5.28 MIL/uL — AB (ref 3.87–5.11)
RDW: 13.5 % (ref 11.5–15.5)
WBC: 8.1 10*3/uL (ref 4.0–10.5)

## 2013-12-17 LAB — D-DIMER, QUANTITATIVE (NOT AT ARMC): D-Dimer, Quant: <0.27 ug/mL-FEU (ref 0.00–0.48)

## 2013-12-17 LAB — I-STAT TROPONIN, ED
TROPONIN I, POC: 0 ng/mL (ref 0.00–0.08)
Troponin i, poc: 0 ng/mL (ref 0.00–0.08)

## 2013-12-17 LAB — PROTIME-INR
INR: 1.04 (ref 0.00–1.49)
PROTHROMBIN TIME: 13.6 s (ref 11.6–15.2)

## 2013-12-17 LAB — TROPONIN I
Troponin I: <0.3 ng/mL (ref <0.30)
Troponin I: <0.3 ng/mL (ref <0.30)

## 2013-12-17 MED ORDER — ACETAMINOPHEN 325 MG PO TABS: 650.0000 mg | ORAL_TABLET | ORAL | Status: DC | PRN

## 2013-12-17 MED ORDER — ASPIRIN EC 81 MG PO TBEC
81.0000 mg | DELAYED_RELEASE_TABLET | Freq: Every day | ORAL | Status: DC
  Administered 2013-12-17: 81 mg via ORAL
  Filled 2013-12-17: qty 1

## 2013-12-17 MED ORDER — AMLODIPINE BESYLATE 5 MG PO TABS: 5.0000 mg | ORAL_TABLET | Freq: Every day | ORAL | Status: DC

## 2013-12-17 MED ORDER — NITROGLYCERIN 0.4 MG SL SUBL
0.4000 mg | SUBLINGUAL_TABLET | SUBLINGUAL | Status: DC | PRN
  Filled 2013-12-17: qty 1

## 2013-12-17 MED ORDER — ASPIRIN 81 MG PO CHEW
324.0000 mg | CHEWABLE_TABLET | Freq: Once | ORAL | Status: AC
  Administered 2013-12-17: 324 mg via ORAL
  Filled 2013-12-17: qty 4

## 2013-12-17 MED ORDER — HYDROXYZINE HCL 25 MG PO TABS: 50.0000 mg | ORAL_TABLET | Freq: Every evening | ORAL | Status: DC | PRN

## 2013-12-17 MED ORDER — PANTOPRAZOLE SODIUM 40 MG PO TBEC: 40.0000 mg | DELAYED_RELEASE_TABLET | Freq: Every day | ORAL | Status: DC

## 2013-12-17 MED ORDER — ENOXAPARIN SODIUM 100 MG/ML ~~LOC~~ SOLN
1.0000 mg/kg | Freq: Two times a day (BID) | SUBCUTANEOUS | Status: AC
  Administered 2013-12-17: 100 mg via SUBCUTANEOUS
  Filled 2013-12-17 (×2): qty 1

## 2013-12-17 MED ORDER — ALPRAZOLAM 0.25 MG PO TABS: 0.2500 mg | ORAL_TABLET | Freq: Two times a day (BID) | ORAL | Status: DC | PRN

## 2013-12-17 MED ORDER — MORPHINE SULFATE 4 MG/ML IJ SOLN
4.0000 mg | Freq: Once | INTRAMUSCULAR | Status: AC
  Administered 2013-12-17: 4 mg via INTRAVENOUS
  Filled 2013-12-17: qty 1

## 2013-12-17 MED ORDER — GI COCKTAIL ~~LOC~~: 30.0000 mL | Freq: Four times a day (QID) | ORAL | Status: DC | PRN

## 2013-12-17 MED ORDER — PANTOPRAZOLE SODIUM 40 MG PO TBEC
40.0000 mg | DELAYED_RELEASE_TABLET | Freq: Every day | ORAL | Status: DC
  Administered 2013-12-17: 40 mg via ORAL
  Filled 2013-12-17: qty 1

## 2013-12-17 MED ORDER — GI COCKTAIL ~~LOC~~
30.0000 mL | Freq: Once | ORAL | Status: AC
  Administered 2013-12-17: 30 mL via ORAL
  Filled 2013-12-17: qty 30

## 2013-12-17 MED ORDER — AMLODIPINE BESYLATE 5 MG PO TABS
5.0000 mg | ORAL_TABLET | Freq: Every day | ORAL | Status: DC
  Administered 2013-12-17: 5 mg via ORAL
  Filled 2013-12-17: qty 1

## 2013-12-17 MED ORDER — MORPHINE SULFATE 2 MG/ML IJ SOLN: 2.0000 mg | INTRAMUSCULAR | Status: DC | PRN

## 2013-12-17 MED ORDER — POTASSIUM CHLORIDE CRYS ER 20 MEQ PO TBCR
40.0000 meq | EXTENDED_RELEASE_TABLET | Freq: Once | ORAL | Status: AC
  Administered 2013-12-17: 40 meq via ORAL
  Filled 2013-12-17: qty 2

## 2013-12-17 MED ORDER — FLUOXETINE HCL 20 MG PO CAPS
20.0000 mg | ORAL_CAPSULE | Freq: Every day | ORAL | Status: DC
  Administered 2013-12-17: 20 mg via ORAL
  Filled 2013-12-17: qty 1

## 2013-12-17 MED ORDER — ONDANSETRON HCL 4 MG/2ML IJ SOLN: 4.0000 mg | Freq: Four times a day (QID) | INTRAMUSCULAR | Status: DC | PRN

## 2013-12-17 MED ORDER — PNEUMOCOCCAL VAC POLYVALENT 25 MCG/0.5ML IJ INJ: 0.5000 mL | INJECTION | INTRAMUSCULAR | Status: DC

## 2013-12-17 NOTE — ED Notes (Signed)
Cardiologist at bedside.  

## 2013-12-17 NOTE — Plan of Care (Signed)
Problem: Consults Goal: Tobacco Cessation referral if indicated Outcome: Not Applicable Date Met:  96/03/90 Pt quit 3 weeks ago  Problem: Phase I Progression Outcomes Goal: Aspirin unless contraindicated Outcome: Completed/Met Date Met:  12/17/13 Pt given 324 mg of ASA in ED

## 2013-12-17 NOTE — Consult Note (Signed)
Reason for Consult: chest pain Referring Physician:  Demiah Gullickson is an 38 y.o. female.  HPI: [name] is a 38 yo woman with PMH of prior tobacco intermittently x 15 years quit more than 1 year ago, hypertension who presents with epigastric pain (she calls her chest) with concomitant pain in both arms. The pain lasted approximately 20 minutes before resolving on its own. She was driving while the pain came on. She also presented with chest pain on 7/12 but that was with a deep breath. Her previous presentation was 07/07/13 with chest pain. She characterizes tonight's CP as bad as 6/10 and a throbbing pain like nothing she's had before. She tells me she could walk 2 miles but hasn't done so in some time. She walked approximately 1 mile/20 minutes last week and tolerated it well without shortness of breath but she recalls she may have had a chest tightness for a few seconds with a deep breath. She does not currently work outside the home but cares for 4 children ages 36,11,8 and 2 at home and two are special needs. Her parents and family members are not known to have CAD to her knowledge. No syncope at home. She's currently chest pain free.   Past Medical History  Diagnosis Date  . Eczema   . Hypertension   . Depression     Past Surgical History  Procedure Laterality Date  . No past surgeries      Family History  Problem Relation Age of Onset  . Anesthesia problems Neg Hx   . Hypertension Mother   . Diabetes Maternal Grandmother   . Hypertension Maternal Grandmother     Social History:  reports that she has quit smoking. She does not have any smokeless tobacco history on file. She reports that she does not drink alcohol or use illicit drugs.  Allergies: No Known Allergies  Medications: I have reviewed the patient's current medications. Prior to Admission:  (Not in a hospital admission) Scheduled:  Continuous:   Results for orders placed during the hospital  encounter of 12/16/13 (from the past 48 hour(s))  COMPREHENSIVE METABOLIC PANEL     Status: Abnormal   Collection Time    12/16/13  8:53 PM      Result Value Ref Range   Sodium 140  137 - 147 mEq/L   Potassium 3.5 (*) 3.7 - 5.3 mEq/L   Chloride 98  96 - 112 mEq/L   CO2 26  19 - 32 mEq/L   Glucose, Bld 113 (*) 70 - 99 mg/dL   BUN 10  6 - 23 mg/dL   Creatinine, Ser 0.78  0.50 - 1.10 mg/dL   Calcium 9.2  8.4 - 10.5 mg/dL   Total Protein 7.9  6.0 - 8.3 g/dL   Albumin 4.3  3.5 - 5.2 g/dL   AST 18  0 - 37 U/L   ALT 14  0 - 35 U/L   Alkaline Phosphatase 50  39 - 117 U/L   Total Bilirubin 0.6  0.3 - 1.2 mg/dL   GFR calc non Af Amer >90  >90 mL/min   GFR calc Af Amer >90  >90 mL/min   Comment: (NOTE)     The eGFR has been calculated using the CKD EPI equation.     This calculation has not been validated in all clinical situations.     eGFR's persistently <90 mL/min signify possible Chronic Kidney     Disease.   Anion gap  16 (*) 5 - 15  CBC WITH DIFFERENTIAL     Status: Abnormal   Collection Time    12/16/13  8:53 PM      Result Value Ref Range   WBC 9.9  4.0 - 10.5 K/uL   RBC 5.09  3.87 - 5.11 MIL/uL   Hemoglobin 12.7  12.0 - 15.0 g/dL   HCT 40.3  36.0 - 46.0 %   MCV 79.2  78.0 - 100.0 fL   MCH 25.0 (*) 26.0 - 34.0 pg   MCHC 31.5  30.0 - 36.0 g/dL   RDW 13.4  11.5 - 15.5 %   Platelets 325  150 - 400 K/uL   Neutrophils Relative % 47  43 - 77 %   Neutro Abs 4.7  1.7 - 7.7 K/uL   Lymphocytes Relative 45  12 - 46 %   Lymphs Abs 4.4 (*) 0.7 - 4.0 K/uL   Monocytes Relative 6  3 - 12 %   Monocytes Absolute 0.6  0.1 - 1.0 K/uL   Eosinophils Relative 2  0 - 5 %   Eosinophils Absolute 0.2  0.0 - 0.7 K/uL   Basophils Relative 0  0 - 1 %   Basophils Absolute 0.0  0.0 - 0.1 K/uL  I-STAT TROPOININ, ED     Status: None   Collection Time    12/16/13  8:59 PM      Result Value Ref Range   Troponin i, poc 0.00  0.00 - 0.08 ng/mL   Comment 3            Comment: Due to the release kinetics  of cTnI,     a negative result within the first hours     of the onset of symptoms does not rule out     myocardial infarction with certainty.     If myocardial infarction is still suspected,     repeat the test at appropriate intervals.  Randolm Idol, ED     Status: None   Collection Time    12/17/13 12:30 AM      Result Value Ref Range   Troponin i, poc 0.00  0.00 - 0.08 ng/mL   Comment 3            Comment: Due to the release kinetics of cTnI,     a negative result within the first hours     of the onset of symptoms does not rule out     myocardial infarction with certainty.     If myocardial infarction is still suspected,     repeat the test at appropriate intervals.  D-DIMER, QUANTITATIVE     Status: None   Collection Time    12/17/13  2:45 AM      Result Value Ref Range   D-Dimer, Quant <0.27  0.00 - 0.48 ug/mL-FEU   Comment:            AT THE INHOUSE ESTABLISHED CUTOFF     VALUE OF 0.48 ug/mL FEU,     THIS ASSAY HAS BEEN DOCUMENTED     IN THE LITERATURE TO HAVE     A SENSITIVITY AND NEGATIVE     PREDICTIVE VALUE OF AT LEAST     98 TO 99%.  THE TEST RESULT     SHOULD BE CORRELATED WITH     AN ASSESSMENT OF THE CLINICAL     PROBABILITY OF DVT / VTE.  Randolm Idol, ED     Status: None  Collection Time    12/17/13  4:09 AM      Result Value Ref Range   Troponin i, poc 0.00  0.00 - 0.08 ng/mL   Comment 3            Comment: Due to the release kinetics of cTnI,     a negative result within the first hours     of the onset of symptoms does not rule out     myocardial infarction with certainty.     If myocardial infarction is still suspected,     repeat the test at appropriate intervals.    Dg Chest 2 View  12/16/2013   CLINICAL DATA:  Chest pain and difficulty breathing  EXAM: CHEST  2 VIEW  COMPARISON:  December 12, 2013  FINDINGS: Lungs are clear. Heart size and pulmonary vascularity are normal. No adenopathy. No pneumothorax. No bone lesions.  IMPRESSION: No  abnormality noted.   Electronically Signed   By: Lowella Grip M.D.   On: 12/16/2013 21:52    Review of Systems  Constitutional: Negative for fever and chills.  HENT: Negative for ear discharge.   Eyes: Negative for double vision and pain.  Respiratory: Negative for cough and shortness of breath.   Cardiovascular: Positive for chest pain.  Gastrointestinal: Negative for heartburn, vomiting and abdominal pain.  Genitourinary: Negative for dysuria and frequency.  Musculoskeletal: Negative for joint pain and myalgias.  Skin: Negative for rash.  Neurological: Negative for dizziness, tingling, tremors and headaches.  Endo/Heme/Allergies: Negative for polydipsia. Does not bruise/bleed easily.  Psychiatric/Behavioral: Negative for depression, suicidal ideas and substance abuse.   Blood pressure 150/97, pulse 71, temperature 98 F (36.7 C), temperature source Oral, resp. rate 18, SpO2 100.00%. Physical Exam  Nursing note and vitals reviewed. Constitutional: She is oriented to person, place, and time. She appears well-developed and well-nourished. No distress.  HENT:  Head: Normocephalic and atraumatic.  Nose: Nose normal.  Mouth/Throat: No oropharyngeal exudate.  Eyes: Conjunctivae and EOM are normal. Pupils are equal, round, and reactive to light. No scleral icterus.  Neck: Normal range of motion. Neck supple. No JVD present. No tracheal deviation present.  Cardiovascular: Normal rate, regular rhythm, normal heart sounds and intact distal pulses.  Exam reveals no gallop.   No murmur heard. Respiratory: Effort normal and breath sounds normal. No respiratory distress. She has no wheezes. She has no rales.  GI: Soft. Bowel sounds are normal. She exhibits no distension. There is no tenderness. There is no rebound.  Musculoskeletal: Normal range of motion. She exhibits no edema and no tenderness.  Neurological: She is alert and oriented to person, place, and time. No cranial nerve deficit.  Coordination normal.  Skin: Skin is warm and dry. No rash noted. She is not diaphoretic. No erythema.  Psychiatric: She has a normal mood and affect. Her behavior is normal. Thought content normal.  labs reviewed; trop 0.00 x3 over 6+ hours Na 140, K 3.5, cr 0.8, d-dimer <0.27, plt 325 Chest x-ray no acute process ECG: unchanged from multiple priors dating to 2012, NSR, subtle ST changes  Assessment/Plan: Whitney White is a 39 yo woman with PMh of hypertension who presents for the third time in 4-5 months with chest pain. Differential diagnosis is musculoskeletal pain, anxiety, esophageal spasm, GERD, pericarditis, ACS/NSTEMI among other etiologies. I favor a diagnosis of atypical chest pain with an anxiety component. However, she's presented three times over the last 4-5 months. I would perform a functional study or chest  CT in AM for more definitive risk stratification.      - trend cardiac markers - observation on telemetry - lovenox treatment dose x1 - would start blood pressure agent - ? Amlodipine for sbp goal < 140/90 Whitney White 12/17/2013, 4:53 AM

## 2013-12-17 NOTE — H&P (Signed)
[facility] History and Physical  Patient: Whitney White  ZOX:[id number]  DOB: 1975/06/27  DOS: the patient was seen and examined on 12/17/2013 PCP: Brock BadHARPER,CHARLES A, MD  Chief Complaint: Chest pain  HPI: Whitney White is a 38 y.o. female with Past medical history of gestational hypertension, eczema, GERD, depression. Patient presented with complain of epigastric pain. She mentions she was driving down the road and all of a sudden she started having epigastric chest pain which was felt like a throbbing pain. This was associated with shortness of breath. She also had some throbbing pain in her both hands. EMS was called, and patient was recommended to followup in the ER if she continues to have pain. And since the pain never recided patient decided to come to the hospital. Patient has been having intermittent chest pain on and off since last few months. Initially she had some chest pain which was located on the left side and felt like a pleuritic chest pain. 4 days ago she was seen in the ED with similar kind of chest pain. Denies any fever or chills denies illicit contacts denies any recent travel denies reason procedure. Denies any dizziness or lightheadedness. Denies any orthopnea or PND. Today her pain improved significantly after receiving GI cocktail and resolved after receiving morphine.  The patient is coming from home. And at her baseline independent for most of her ADL.  Review of Systems: as mentioned in the history of present illness.  A Comprehensive review of the other systems is negative.  Past Medical History  Diagnosis Date  . Eczema   . Hypertension   . Depression   . Gestational hypertension   . GERD (gastroesophageal reflux disease)    Past Surgical History  Procedure Laterality Date  . No past surgeries     Social History:  reports that she has quit smoking. She does not have any smokeless tobacco history on file. She reports that she does not drink alcohol  or use illicit drugs.  No Known Allergies  Family History  Problem Relation Age of Onset  . Anesthesia problems Neg Hx   . Hypertension Mother   . Diabetes Maternal Grandmother   . Hypertension Maternal Grandmother     Prior to Admission medications   Medication Sig Start Date End Date Taking? Authorizing Provider  FLUoxetine (PROZAC) 20 MG capsule Take 20 mg by mouth daily.   Yes Historical Provider, MD  hydrOXYzine (ATARAX/VISTARIL) 50 MG tablet Take 50 mg by mouth at bedtime as needed (sleep).   Yes Historical Provider, MD  levonorgestrel (MIRENA) 20 MCG/24HR IUD 1 each by Intrauterine route once.   Yes Historical Provider, MD    Physical Exam: Filed Vitals:   12/17/13 0330 12/17/13 0400 12/17/13 0445 12/17/13 0536  BP: 118/63 150/97 130/83 136/85  Pulse:      Temp:    97.6 F (36.4 C)  TempSrc:      Resp:   19 18  Height:    5\' 1"  (1.549 m)  Weight:    98.975 kg (218 lb 3.2 oz)  SpO2:    100%    General: Alert, Awake and Oriented to Time, Place and Person. Appear in mild distress Eyes: PERRL ENT: Oral Mucosa clear moist. Neck: no JVD Cardiovascular: S1 and S2 Present, no Murmur, Peripheral Pulses Present Respiratory: Bilateral Air entry equal and Decreased, Clear to Auscultation, noCrackles, no wheezes Abdomen: Bowel Sound Present, Soft and =Non tender Skin: no Rash Extremities: no Pedal edema, no calf tenderness  Neurologic: Grossly no focal neuro deficit.  Labs on Admission:  CBC:  Recent Labs Lab 12/12/13 0127 12/16/13 2053  WBC 9.0 9.9  NEUTROABS 4.2 4.7  HGB 12.6 12.7  HCT 39.3 40.3  MCV 77.5* 79.2  PLT 310 325    CMP     Component Value Date/Time   NA 140 12/16/2013 2053   K 3.5* 12/16/2013 2053   CL 98 12/16/2013 2053   CO2 26 12/16/2013 2053   GLUCOSE 113* 12/16/2013 2053   BUN 10 12/16/2013 2053   CREATININE 0.78 12/16/2013 2053   CALCIUM 9.2 12/16/2013 2053   PROT 7.9 12/16/2013 2053   ALBUMIN 4.3 12/16/2013 2053   AST 18 12/16/2013 2053    ALT 14 12/16/2013 2053   ALKPHOS 50 12/16/2013 2053   BILITOT 0.6 12/16/2013 2053   GFRNONAA >90 12/16/2013 2053   GFRAA >90 12/16/2013 2053    No results found for this basename: LIPASE, AMYLASE,  in the last 168 hours No results found for this basename: AMMONIA,  in the last 168 hours   Recent Labs Lab 12/12/13 0128  TROPONINI <0.30   BNP (last 3 results) No results found for this basename: PROBNP,  in the last 8760 hours  Radiological Exams on Admission: Dg Chest 2 View  12/16/2013   CLINICAL DATA:  Chest pain and difficulty breathing  EXAM: CHEST  2 VIEW  COMPARISON:  December 12, 2013  FINDINGS: Lungs are clear. Heart size and pulmonary vascularity are normal. No adenopathy. No pneumothorax. No bone lesions.  IMPRESSION: No abnormality noted.   Electronically Signed   By: Bretta Bang M.D.   On: 12/16/2013 21:52    EKG: Independently reviewed. normal sinus rhythm, nonspecific ST and T waves changes. Assessment/Plan Principal Problem:   Chest pain Active Problems:   Depression   Hypertension   Eczema   1. Chest pain Patient presents with complaints of recurrent chest pain which has been ongoing since last one week. She had multiple ER visits with negative troponins. Chest x-ray has also been remain negative. She has a negative d-dimer as well. Her pain improved with GI cocktail and morphine. But he continues to have some nonspecific ST-T wave changes on her EKG. Cardiology was initially consulted who recommended functional study for definitive diagnosis and therefore the patient is admitted to the hospital. At present patient will remain n.p.o. I would recheck her troponin at 9. Also monitor her on telemetry. Continue her GI cocktail, as needed morphine and aspirin. Cardiology also recommended one dose of therapeutic dose of Lovenox which will be provided.  2. Accelerated hypertension  For the blood pressure as per cardiac recommendation I would start her on amlodipine 5 mg  daily. Patient was in the past on Dyazide and labetalol which he has stopped taking in between after her pregnancy.  3. GERD. Patient was on Nexium in the past and she has stopped taking it At present I would start her on Pepcid.  4. Anxiety. Would continue her home medications and place her on Xanax as needed.  Consults: Cardiology, appreciate input  DVT Prophylaxis: subcutaneous Heparin Nutrition: N.p.o. except medication  Code Status: Full  Disposition: Admitted to observation in telemetry unit.  Author: Lynden Oxford, MD [facility] Pager: [phone] 12/17/2013, 6:22 AM    If 7PM-7AM, please contact night-coverage [email or web address] Password [email or web address]  **Disclaimer: This note may have been dictated with voice recognition software. Similar sounding words can inadvertently be transcribed and this note may contain transcription errors which may  not have been corrected upon publication of note.**

## 2013-12-17 NOTE — Discharge Summary (Signed)
Physician Discharge Summary  Whitney BoucheShanni V Ryles YQM:[id number] DOB: Jan 13, 1976 DOA: 12/16/2013  PCP: Brock BadHARPER,CHARLES A, MD  Admit date: 12/16/2013 Discharge date: 12/17/2013  Time spent: 45 minutes  Recommendations for Outpatient Follow-up:  1. PCP in 1 week  Discharge Diagnoses:  Principal Problem:   Atypical Chest pain Active Problems:   Depression   Hypertension   Eczema   Gestational hypertension   GERD (gastroesophageal reflux disease)   Discharge Condition: stable  Diet recommendation: heart healthy  Filed Weights   12/17/13 0536 12/17/13 0630  Weight: 98.975 kg (218 lb 3.2 oz) 98.9 kg (218 lb 0.6 oz)    History of present illness:  Whitney White is a 38 y.o. female with Past medical history of gestational hypertension, eczema, GERD, depression.  Patient presented to the ER with complain of epigastric pain. She mentioned she was driving down the road and all of a sudden she started having epigastric chest pain which was felt like a throbbing pain. This was associated with shortness of breath. Patient has been having intermittent chest pain on and off since last few months. Initially she had some chest pain which was located on the left side and felt like a pleuritic chest pain.  4 days ago she was seen in the ED with similar kind of chest pain.  Hospital Course:  She was admitted with atypical chest pain to telemetry, cardiac enzymes negative. EKG non specific. Seen by Cardiology in consultation, underwent Stress ECHO, with normal results and hence discharged home in a stable condition  Procedures: Stress ECHO:   Consultations:  Cardiology  Discharge Exam: Filed Vitals:   12/17/13 1203  BP: 124/77  Pulse: 63  Temp: 98.3 F (36.8 C)  Resp: 16    General: AAOx3 Cardiovascular: S1S2/RRR Respiratory: CTAB  Discharge Instructions You were cared for by a hospitalist during your hospital stay. If you have any questions about your discharge medications or the care  you received while you were in the hospital after you are discharged, you can call the unit and asked to speak with the hospitalist on call if the hospitalist that took care of you is not available. Once you are discharged, your primary care physician will handle any further medical issues. Please note that NO REFILLS for any discharge medications will be authorized once you are discharged, as it is imperative that you return to your primary care physician (or establish a relationship with a primary care physician if you do not have one) for your aftercare needs so that they can reassess your need for medications and monitor your lab values.  Discharge Instructions   Diet general    Complete by:  As directed      Increase activity slowly    Complete by:  As directed             Medication List         FLUoxetine 20 MG capsule  Commonly known as:  PROZAC  Take 20 mg by mouth daily.     hydrOXYzine 50 MG tablet  Commonly known as:  ATARAX/VISTARIL  Take 50 mg by mouth at bedtime as needed (sleep).     levonorgestrel 20 MCG/24HR IUD  Commonly known as:  MIRENA  1 each by Intrauterine route once.     pantoprazole 40 MG tablet  Commonly known as:  PROTONIX  Take 1 tablet (40 mg total) by mouth daily.       No Known Allergies     Follow-up Information  Follow up with HARPER,CHARLES A, MD. (As needed, If symptoms worsen)    Specialty:  Obstetrics and Gynecology   Contact information:   [address] [place] [address] [phone]        The results of significant diagnostics from this hospitalization (including imaging, microbiology, ancillary and laboratory) are listed below for reference.    Significant Diagnostic Studies: Dg Chest 2 View  12/16/2013   CLINICAL DATA:  Chest pain and difficulty breathing  EXAM: CHEST  2 VIEW  COMPARISON:  December 12, 2013  FINDINGS: Lungs are clear. Heart size and pulmonary vascularity are normal. No adenopathy. No  pneumothorax. No bone lesions.  IMPRESSION: No abnormality noted.   Electronically Signed   By: Bretta Bang M.D.   On: 12/16/2013 21:52   Dg Chest 2 View  12/12/2013   CLINICAL DATA:  Left-sided chest pain and headache for 2 days. Congestion.  EXAM: CHEST  2 VIEW  COMPARISON:  07/07/2013  FINDINGS: The heart size and mediastinal contours are within normal limits. Both lungs are clear. The visualized skeletal structures are unremarkable.  IMPRESSION: No active cardiopulmonary disease.   Electronically Signed   By: Burman Nieves M.D.   On: 12/12/2013 01:57    Microbiology: No results found for this or any previous visit (from the past 240 hour(s)).   Labs: Basic Metabolic Panel:  Recent Labs Lab 12/12/13 0127 12/16/13 2053 12/17/13 0915  NA 141 140 141  K 3.7 3.5* 4.0  CL 100 98 100  CO2 27 26 28   GLUCOSE 93 113* 90  BUN 10 10 8   CREATININE 0.76 0.78 0.74  CALCIUM 9.2 9.2 9.3   Liver Function Tests:  Recent Labs Lab 12/12/13 0127 12/16/13 2053 12/17/13 0915  AST 17 18 15   ALT 13 14 12   ALKPHOS 49 50 52  BILITOT 0.5 0.6 0.9  PROT 8.1 7.9 8.1  ALBUMIN 4.3 4.3 4.3   No results found for this basename: LIPASE, AMYLASE,  in the last 168 hours No results found for this basename: AMMONIA,  in the last 168 hours CBC:  Recent Labs Lab 12/12/13 0127 12/16/13 2053 12/17/13 0915  WBC 9.0 9.9 8.1  NEUTROABS 4.2 4.7 4.6  HGB 12.6 12.7 13.1  HCT 39.3 40.3 42.1  MCV 77.5* 79.2 79.7  PLT 310 325 311   Cardiac Enzymes:  Recent Labs Lab 12/12/13 0128 12/17/13 0915  TROPONINI <0.30 <0.30   BNP: BNP (last 3 results) No results found for this basename: PROBNP,  in the last 8760 hours CBG: No results found for this basename: GLUCAP,  in the last 168 hours     Signed:  Charlena Haub  [facility] 12/17/2013, 12:11 PM

## 2013-12-17 NOTE — Progress Notes (Signed)
Reassuring stress ECHO.  OK with DC from cardiac standpoint.  Non cardiac chest pain.   Donato SchultzSKAINS, Darothy Courtright, MD

## 2013-12-17 NOTE — ED Provider Notes (Signed)
Medical screening examination/treatment/procedure(s) were performed by non-physician practitioner and as supervising physician I was immediately available for consultation/collaboration.   EKG Interpretation   Date/Time:  Thursday December 16 2013 20:47:03 EDT Ventricular Rate:  96 PR Interval:  156 QRS Duration: 98 QT Interval:  346 QTC Calculation: 437 R Axis:   66 Text Interpretation:  Normal sinus rhythm T wave abnormality, consider  inferior ischemia Abnormal ECG When compared with ECG of 12/12/2013, T wave  inversion is now Present Inferior leads Confirmed by [facility] Surgicenter LLCGLICK  MD, Kiron Osmun  ([id number]) on 12/17/2013 3:58:37 AM        Dione Boozeavid Ahaan Zobrist, MD 12/17/13 [phone]

## 2013-12-17 NOTE — Progress Notes (Signed)
  Echocardiogram Echocardiogram Stress Test has been performed.  Margreta JourneyLOMBARDO, Sherril Shipman 12/17/2013, 10:43 AM

## 2013-12-17 NOTE — ED Provider Notes (Signed)
CSN: [id number]     Arrival date & time 12/16/13  2040 History   First MD Initiated Contact with Patient 12/16/13 2359     Chief Complaint  Patient presents with  . Chest Pain     (Consider location/radiation/quality/duration/timing/severity/associated sxs/prior Treatment) HPI  38 year old female with history of hypertension, depression, who presents complaining of chest pain. Patient reports intermittent chest discomfort ongoing for a week. Pain started at 8pm today, while she was going to the store with her friend.  EMS was contacted, felt it was an anxiety attack but told pt if not improve then pt should come to ER. Pt went home but since her pain did not improve, pt comes to ER.   Pt report pain is epigastric, radiates to both arms and both legs. Did report shortness of breath, and diaphoresis initially but SOB have resolved since been in ER.  No report of fever, chills, productive cough, hemoptysis, abdominal pain, back pain, palpitation.  Report the chest pain has increase her stress and anxiety since mother's death since her mom died at the age of 54 from a stroke (pt was 10 at that time).  No PE/DVT, recent surgery, prolonged bed rest, oral birth control pill, hx of cancer, no hemoptysis, unilateral leg swelling or calf pain.  Pain is currently 5/10, described as prickling sensation throughout chest and arm. Pt is a former smoker.  No hx of diabetes. No hx of cardiac disease in the past.    Past Medical History  Diagnosis Date  . Eczema   . Hypertension   . Depression    Past Surgical History  Procedure Laterality Date  . No past surgeries     Family History  Problem Relation Age of Onset  . Anesthesia problems Neg Hx   . Hypertension Mother   . Diabetes Maternal Grandmother   . Hypertension Maternal Grandmother    History  Substance Use Topics  . Smoking status: Former Games developer  . Smokeless tobacco: Not on file  . Alcohol Use: No   OB History   Grav Para Term Preterm  Abortions TAB SAB Ect Mult Living   4 4 4  0 0 0 0 0 0 4     Review of Systems  All other systems reviewed and are negative.     Allergies  Review of patient's allergies indicates no known allergies.  Home Medications   Prior to Admission medications   Medication Sig Start Date End Date Taking? Authorizing Provider  FLUoxetine (PROZAC) 20 MG capsule Take 20 mg by mouth daily.    Historical Provider, MD  hydrOXYzine (ATARAX/VISTARIL) 50 MG tablet Take 50 mg by mouth at bedtime as needed (sleep).    Historical Provider, MD  levonorgestrel (MIRENA) 20 MCG/24HR IUD 1 each by Intrauterine route once.    Historical Provider, MD   BP 138/87  Pulse 78  Temp(Src) 98 F (36.7 C) (Oral)  Resp 18  SpO2 100% Physical Exam  Nursing note and vitals reviewed. Constitutional: She is oriented to person, place, and time. She appears well-developed and well-nourished. No distress.  HENT:  Head: Atraumatic.  Eyes: Conjunctivae are normal.  Neck: Neck supple.  Cardiovascular: Normal rate, regular rhythm and intact distal pulses.   Pulmonary/Chest: Effort normal and breath sounds normal. She exhibits no tenderness.  Abdominal: There is no tenderness.  Musculoskeletal: She exhibits no edema.  Neurological: She is alert and oriented to person, place, and time.  Skin: No rash noted.  Psychiatric: She has a normal mood  and affect.    ED Course  Procedures (including critical care time)  12:22 AM Pt with intermittent chest discomfort for the past week.  Pt is PERC negative, Heart score 2.  Workup today has been reassuring.  Doubt acute emergent condition.  Suspect elements of anxiety as pt appears anxious.  Pt was evaluated for same 4 days ago and sxs was thought to be anxiety as well.  Pt does have PCP which she agrees to f/u.    1:12 AM Negative delta trop.  Still has some residual epigastric pain. However ECG today shows more pronounce ST changes in inferior leads as compared to 4 days ago.   I discussed with Dr. Preston FleetingGlick who recommend admission for obs.  Will consult for admission.    1:52 AM I have consulted [facility], Dr. Allena KatzPatel who felt that pt is essentially r/o for STEMI/NSTEMI.  He recommend cardiology consult for further management for unstable angina.  Care discussed with Dr. Preston FleetingGlick.    2:00 AM Pain has resolved after receiving ASA and morphine.  Pt is hemodynamically stable.  Dr. Allena KatzPatel recommend d-dimer.  Care discussed with Dr. Preston FleetingGlick who will dispo pt pending result of cardiology consult and d-dimer.    Labs Review Labs Reviewed  COMPREHENSIVE METABOLIC PANEL - Abnormal; Notable for the following:    Potassium 3.5 (*)    Glucose, Bld 113 (*)    Anion gap 16 (*)    All other components within normal limits  CBC WITH DIFFERENTIAL - Abnormal; Notable for the following:    MCH 25.0 (*)    Lymphs Abs 4.4 (*)    All other components within normal limits  D-DIMER, QUANTITATIVE  I-STAT TROPOININ, ED  Rosezena SensorI-STAT TROPOININ, ED    Imaging Review Dg Chest 2 View  12/16/2013   CLINICAL DATA:  Chest pain and difficulty breathing  EXAM: CHEST  2 VIEW  COMPARISON:  December 12, 2013  FINDINGS: Lungs are clear. Heart size and pulmonary vascularity are normal. No adenopathy. No pneumothorax. No bone lesions.  IMPRESSION: No abnormality noted.   Electronically Signed   By: Bretta BangWilliam  Woodruff M.D.   On: 12/16/2013 21:52     EKG Interpretation None      Date: 12/17/2013  Rate: 96  Rhythm: normal sinus rhythm  QRS Axis: normal  Intervals: normal  ST/T Wave abnormalities: ST depressions inferiorly  Conduction Disutrbances:none  Narrative Interpretation: more pronounce ST changes in inferior leads  Old EKG Reviewed: changes noted ECG interpreted by me.     MDM   Final diagnoses:  Chest pain, unspecified chest pain type    BP 148/102  Pulse 71  Temp(Src) 98 F (36.7 C) (Oral)  Resp 18  SpO2 100%  I have reviewed nursing notes and vital signs. I personally reviewed  the imaging tests through PACS system  I reviewed available ER/hospitalization records thought the EMR     Fayrene HelperBowie Shital Crayton, [place] JerseyPA-C 12/17/13 0216

## 2013-12-17 NOTE — Discharge Instructions (Signed)
Please follow up closely with your primary care doctor for further evaluation of your chest discomfort.  Return to ER if your symptoms worsen or if you have other concerns.    Chest Pain (Nonspecific) It is often hard to give a diagnosis for the cause of chest pain. There is always a chance that your pain could be related to something serious, such as a heart attack or a blood clot in the lungs. You need to follow up with your doctor. HOME CARE  If antibiotic medicine was given, take it as directed by your doctor. Finish the medicine even if you start to feel better.  For the next few days, avoid activities that bring on chest pain. Continue physical activities as told by your doctor.  Do not use any tobacco products. This includes cigarettes, chewing tobacco, and e-cigarettes.  Avoid drinking alcohol.  Only take medicine as told by your doctor.  Follow your doctor's suggestions for more testing if your chest pain does not go away.  Keep all doctor visits you made. GET HELP IF:  Your chest pain does not go away, even after treatment.  You have a rash with blisters on your chest.  You have a fever. GET HELP RIGHT AWAY IF:   You have more pain or pain that spreads to your arm, neck, jaw, back, or belly (abdomen).  You have shortness of breath.  You cough more than usual or cough up blood.  You have very bad back or belly pain.  You feel sick to your stomach (nauseous) or throw up (vomit).  You have very bad weakness.  You pass out (faint).  You have chills. This is an emergency. Do not wait to see if the problems will go away. Call your local emergency services (911 in [place] Do not drive yourself to the hospital. MAKE SURE YOU:   Understand these instructions.  Will watch your condition.  Will get help right away if you are not doing well or get worse. Document Released: 11/06/2007 Document Revised: 05/25/2013 Document Reviewed: 11/06/2007 [facility] Patient  Information 2015 [employer], [place]. This information is not intended to replace advice given to you by your health care provider. Make sure you discuss any questions you have with your health care provider.

## 2013-12-17 NOTE — Progress Notes (Signed)
I have ordered a Stress ECHO after review of Dr. Tresa EndoKelly consult note.  Donato SchultzSKAINS, Lilit Cinelli, MD

## 2013-12-17 NOTE — Progress Notes (Signed)
Utilization review completed.  

## 2013-12-22 ENCOUNTER — Ambulatory Visit: Payer: Medicaid Other | Attending: Internal Medicine | Admitting: Internal Medicine

## 2013-12-22 ENCOUNTER — Encounter: Payer: Self-pay | Admitting: Emergency Medicine

## 2013-12-22 VITALS — BP 135/88 | HR 90 | Temp 98.3°F | Resp 16 | Ht 62.0 in | Wt 217.0 lb

## 2013-12-22 DIAGNOSIS — F411 Generalized anxiety disorder: Secondary | ICD-10-CM | POA: Diagnosis not present

## 2013-12-22 DIAGNOSIS — Z6839 Body mass index (BMI) 39.0-39.9, adult: Secondary | ICD-10-CM | POA: Diagnosis not present

## 2013-12-22 DIAGNOSIS — R0789 Other chest pain: Secondary | ICD-10-CM | POA: Diagnosis not present

## 2013-12-22 DIAGNOSIS — I10 Essential (primary) hypertension: Secondary | ICD-10-CM | POA: Insufficient documentation

## 2013-12-22 DIAGNOSIS — Z87891 Personal history of nicotine dependence: Secondary | ICD-10-CM | POA: Insufficient documentation

## 2013-12-22 DIAGNOSIS — F3289 Other specified depressive episodes: Secondary | ICD-10-CM | POA: Diagnosis not present

## 2013-12-22 DIAGNOSIS — Z79899 Other long term (current) drug therapy: Secondary | ICD-10-CM | POA: Insufficient documentation

## 2013-12-22 DIAGNOSIS — K219 Gastro-esophageal reflux disease without esophagitis: Secondary | ICD-10-CM | POA: Diagnosis not present

## 2013-12-22 DIAGNOSIS — F32A Depression, unspecified: Secondary | ICD-10-CM

## 2013-12-22 DIAGNOSIS — F329 Major depressive disorder, single episode, unspecified: Secondary | ICD-10-CM

## 2013-12-22 DIAGNOSIS — R079 Chest pain, unspecified: Secondary | ICD-10-CM

## 2013-12-22 DIAGNOSIS — E669 Obesity, unspecified: Secondary | ICD-10-CM

## 2013-12-22 LAB — TSH: TSH: 0.397 u[IU]/mL (ref 0.350–4.500)

## 2013-12-22 NOTE — Patient Instructions (Signed)
DASH Eating Plan  DASH stands for "Dietary Approaches to Stop Hypertension." The DASH eating plan is a healthy eating plan that has been shown to reduce high blood pressure (hypertension). Additional health benefits may include reducing the risk of type 2 diabetes mellitus, heart disease, and stroke. The DASH eating plan may also help with weight loss.  WHAT DO I NEED TO KNOW ABOUT THE DASH EATING PLAN?  For the DASH eating plan, you will follow these general guidelines:  · Choose foods with a percent daily value for sodium of less than 5% (as listed on the food label).  · Use salt-free seasonings or herbs instead of table salt or sea salt.  · Check with your health care provider or pharmacist before using salt substitutes.  · Eat lower-sodium products, often labeled as "lower sodium" or "no salt added."  · Eat fresh foods.  · Eat more vegetables, fruits, and low-fat dairy products.  · Choose whole grains. Look for the word "whole" as the first word in the ingredient list.  · Choose fish and skinless chicken or turkey more often than red meat. Limit fish, poultry, and meat to 6 oz (170 g) each day.  · Limit sweets, desserts, sugars, and sugary drinks.  · Choose heart-healthy fats.  · Limit cheese to 1 oz (28 g) per day.  · Eat more home-cooked food and less restaurant, buffet, and fast food.  · Limit fried foods.  · Cook foods using methods other than frying.  · Limit canned vegetables. If you do use them, rinse them well to decrease the sodium.  · When eating at a restaurant, ask that your food be prepared with less salt, or no salt if possible.  WHAT FOODS CAN I EAT?  Seek help from a dietitian for individual calorie needs.  Grains  Whole grain or whole wheat bread. Brown rice. Whole grain or whole wheat pasta. Quinoa, bulgur, and whole grain cereals. Low-sodium cereals. Corn or whole wheat flour tortillas. Whole grain cornbread. Whole grain crackers. Low-sodium crackers.  Vegetables  Fresh or frozen vegetables  (raw, steamed, roasted, or grilled). Low-sodium or reduced-sodium tomato and vegetable juices. Low-sodium or reduced-sodium tomato sauce and paste. Low-sodium or reduced-sodium canned vegetables.   Fruits  All fresh, canned (in natural juice), or frozen fruits.  Meat and Other Protein Products  Ground beef (85% or leaner), grass-fed beef, or beef trimmed of fat. Skinless chicken or turkey. Ground chicken or turkey. Pork trimmed of fat. All fish and seafood. Eggs. Dried beans, peas, or lentils. Unsalted nuts and seeds. Unsalted canned beans.  Dairy  Low-fat dairy products, such as skim or 1% milk, 2% or reduced-fat cheeses, low-fat ricotta or cottage cheese, or plain low-fat yogurt. Low-sodium or reduced-sodium cheeses.  Fats and Oils  Tub margarines without trans fats. Light or reduced-fat mayonnaise and salad dressings (reduced sodium). Avocado. Safflower, olive, or canola oils. Natural peanut or almond butter.  Other  Unsalted popcorn and pretzels.  The items listed above may not be a complete list of recommended foods or beverages. Contact your dietitian for more options.  WHAT FOODS ARE NOT RECOMMENDED?  Grains  White bread. White pasta. White rice. Refined cornbread. Bagels and croissants. Crackers that contain trans fat.  Vegetables  Creamed or fried vegetables. Vegetables in a cheese sauce. Regular canned vegetables. Regular canned tomato sauce and paste. Regular tomato and vegetable juices.  Fruits  Dried fruits. Canned fruit in light or heavy syrup. Fruit juice.  Meat and Other Protein   Products  Fatty cuts of meat. Ribs, chicken wings, bacon, sausage, bologna, salami, chitterlings, fatback, hot dogs, bratwurst, and packaged luncheon meats. Salted nuts and seeds. Canned beans with salt.  Dairy  Whole or 2% milk, cream, half-and-half, and cream cheese. Whole-fat or sweetened yogurt. Full-fat cheeses or Burgo cheese. Nondairy creamers and whipped toppings. Processed cheese, cheese spreads, or cheese  curds.  Condiments  Onion and garlic salt, seasoned salt, table salt, and sea salt. Canned and packaged gravies. Worcestershire sauce. Tartar sauce. Barbecue sauce. Teriyaki sauce. Soy sauce, including reduced sodium. Steak sauce. Fish sauce. Oyster sauce. Cocktail sauce. Horseradish. Ketchup and mustard. Meat flavorings and tenderizers. Bouillon cubes. Hot sauce. Tabasco sauce. Marinades. Taco seasonings. Relishes.  Fats and Oils  Butter, stick margarine, lard, shortening, ghee, and bacon fat. Coconut, palm kernel, or palm oils. Regular salad dressings.  Other  Pickles and olives. Salted popcorn and pretzels.  The items listed above may not be a complete list of foods and beverages to avoid. Contact your dietitian for more information.  WHERE CAN I FIND MORE INFORMATION?  National Heart, Lung, and Blood Institute: www.nhlbi.nih.gov/health/health-topics/topics/dash/  Document Released: 05/09/2011 Document Revised: 05/25/2013 Document Reviewed: 03/24/2013  ExitCare® Patient Information ©2015 [employer], LLC. This information is not intended to replace advice given to you by your health care provider. Make sure you discuss any questions you have with your health care provider.

## 2013-12-22 NOTE — Progress Notes (Signed)
Pt here to establish care for Left chest wall pain secondary to new diagnosis HTN Pt prescribed Norvasc 5 mg tablet daily Echo/stress test negative BP- 133/85 States the pain started last night while lying down,constant left chest nonradiating

## 2013-12-22 NOTE — Progress Notes (Signed)
Patient Demographics  Whitney White, is a 38 y.o. female  ZOX:[id number]  WJX:[id number]  DOB - 05/13/1976  CC:  Chief Complaint  Patient presents with  . Hospitalization Follow-up  . Chest Pain  . Hypertension       HPI: Whitney White is a 38 y.o. female here today to establish medical care.She has history of GERD depression gestational hypertension , she was recently hospitalized her with the symptoms of chest pain which was more of an erratic in nature, EMR reviewed her cardiac enzymes were negative EKG was nonspecific cardiology was on board stress echo was also negative, last night she had some left-sided chest pain but denies any currently denies any nausea vomiting, she is taking her blood pressure medication and her bone medication. Patient also has history of anxiety/depression following up with psychiatrist and is on Prozac and Atarax. Patient has No headache, No chest pain, No abdominal pain - No Nausea, No new weakness tingling or numbness, No Cough - SOB.  No Known Allergies Past Medical History  Diagnosis Date  . Eczema   . Hypertension   . Depression   . Gestational hypertension   . GERD (gastroesophageal reflux disease)    Current Outpatient Prescriptions on File Prior to Visit  Medication Sig Dispense Refill  . amLODipine (NORVASC) 5 MG tablet Take 1 tablet (5 mg total) by mouth daily.  30 tablet  0  . FLUoxetine (PROZAC) 20 MG capsule Take 20 mg by mouth daily.      . hydrOXYzine (ATARAX/VISTARIL) 50 MG tablet Take 50 mg by mouth at bedtime as needed (sleep).      Marland Kitchen levonorgestrel (MIRENA) 20 MCG/24HR IUD 1 each by Intrauterine route once.      . pantoprazole (PROTONIX) 40 MG tablet Take 1 tablet (40 mg total) by mouth daily.  30 tablet  0   No current facility-administered medications on file prior to visit.   Family History  Problem Relation Age of Onset  . Anesthesia problems Neg Hx   . Hypertension Mother   . Stroke Mother   . Diabetes Maternal  Grandmother   . Hypertension Maternal Grandmother   . Cancer Maternal Aunt     breast cancer    History   Social History  . Marital Status: Divorced    Spouse Name: N/A    Number of Children: N/A  . Years of Education: N/A   Occupational History  . Not on file.   Social History Main Topics  . Smoking status: Former Games developer  . Smokeless tobacco: Not on file  . Alcohol Use: No  . Drug Use: No  . Sexual Activity: Not Currently    Birth Control/ Protection: None, Other-see comments     Comment: merania removed   Other Topics Concern  . Not on file   Social History Narrative  . No narrative on file    Review of Systems: Constitutional: Negative for fever, chills, diaphoresis, activity change, appetite change and fatigue. HENT: Negative for ear pain, nosebleeds, congestion, facial swelling, rhinorrhea, neck pain, neck stiffness and ear discharge.  Eyes: Negative for pain, discharge, redness, itching and visual disturbance. Respiratory: Negative for cough, choking, chest tightness, shortness of breath, wheezing and stridor.  Cardiovascular: Negative for chest pain, palpitations and leg swelling. Gastrointestinal: Negative for abdominal distention. Genitourinary: Negative for dysuria, urgency, frequency, hematuria, flank pain, decreased urine volume, difficulty urinating and dyspareunia.  Musculoskeletal: Negative for back pain, joint swelling, arthralgia and gait problem. Neurological: Negative for dizziness,  tremors, seizures, syncope, facial asymmetry, speech difficulty, weakness, light-headedness, numbness and headaches.  Hematological: Negative for adenopathy. Does not bruise/bleed easily. Psychiatric/Behavioral: Negative for hallucinations, behavioral problems, confusion, dysphoric mood, decreased concentration and agitation.    Objective:   Filed Vitals:   12/22/13 1101  BP: 135/88  Pulse: 90  Temp: 98.3 F (36.8 C)  Resp: 16    Physical Exam: Constitutional:  Obese female sitting comfortably not in acute distress. Her HENT: Normocephalic, atraumatic, External right and left ear normal. Oropharynx is clear and moist.  Eyes: Conjunctivae and EOM are normal. PERRLA, no scleral icterus. Neck: Normal ROM. Neck supple. No JVD. No tracheal deviation. No thyromegaly. CVS: RRR, S1/S2 +, no murmurs, no gallops, no carotid bruit.  Pulmonary: Effort and breath sounds normal, no stridor, rhonchi, wheezes, rales.  Abdominal: Soft. BS +, no distension, tenderness, rebound or guarding.  Musculoskeletal: Normal range of motion. No edema and no tenderness.  Neuro: Alert. Normal reflexes, muscle tone coordination. No cranial nerve deficit. Skin: Skin is warm and dry. No rash noted. Not diaphoretic. No erythema. No pallor. Psychiatric: Normal mood and affect. Behavior, judgment, thought content normal.  Lab Results  Component Value Date   WBC 8.1 12/17/2013   HGB 13.1 12/17/2013   HCT 42.1 12/17/2013   MCV 79.7 12/17/2013   PLT 311 12/17/2013   Lab Results  Component Value Date   CREATININE 0.74 12/17/2013   BUN 8 12/17/2013   NA 141 12/17/2013   K 4.0 12/17/2013   CL 100 12/17/2013   CO2 28 12/17/2013    No results found for this basename: HGBA1C   Lipid Panel  No results found for this basename: chol, trig, hdl, cholhdl, vldl, ldlcalc       Assessment and plan:   1. Chest pain, unspecified chest pain type Atypical chest pain patient is advised for lifestyle modification.  2. Gastroesophageal reflux disease without esophagitis Continue with Protonix.   3. Essential hypertension Patient is on Norvasc 5 mg daily, also advise for DASH diet  , patient reported to have family history of thyroid disease Will check TSH level. - Vit D  25 hydroxy (rtn osteoporosis monitoring) - TSH  4. Depression/5. Anxiety state, unspecified   Patient is on Prozac and Atarax currently following up with psychiatrist.  6. Obesity, unspecified Encouraged for dietary  modification and exercise.  Return in about 3 months (around 03/24/2014) for hypertension.  Doris CheadleADVANI, Ryenne Lynam, MD

## 2013-12-23 ENCOUNTER — Telehealth: Payer: Self-pay

## 2013-12-23 LAB — VITAMIN D 25 HYDROXY (VIT D DEFICIENCY, FRACTURES): Vit D, 25-Hydroxy: 23 ng/mL — ABNORMAL LOW (ref 30–89)

## 2013-12-23 MED ORDER — VITAMIN D (ERGOCALCIFEROL) 1.25 MG (50000 UNIT) PO CAPS: 50000.0000 [IU] | ORAL_CAPSULE | ORAL | Status: DC

## 2013-12-23 NOTE — Telephone Encounter (Signed)
Patient not available Left message on voice mail to return our call 

## 2013-12-23 NOTE — Telephone Encounter (Signed)
Message copied by Lestine MountJUAREZ, Estill Llerena L on Thu Dec 23, 2013  2:40 PM ------      Message from: Doris CheadleADVANI, DEEPAK      Created: Thu Dec 23, 2013 10:06 AM       Blood work reviewed, noticed low vitamin D, call patient advise to start ergocalciferol 50,000 units once a week for the duration of  12 weeks.       ------

## 2013-12-24 ENCOUNTER — Telehealth: Payer: Self-pay

## 2013-12-24 NOTE — Telephone Encounter (Signed)
Returned patient phone call and she is aware of her lab results 

## 2013-12-28 ENCOUNTER — Emergency Department (HOSPITAL_COMMUNITY)
Admission: EM | Admit: 2013-12-28 | Discharge: 2013-12-29 | Disposition: A | Discharge status: Home / Self Care | Payer: Medicaid Other | Attending: Emergency Medicine | Admitting: Emergency Medicine

## 2013-12-28 ENCOUNTER — Emergency Department (HOSPITAL_COMMUNITY): Payer: Medicaid Other

## 2013-12-28 ENCOUNTER — Encounter (HOSPITAL_COMMUNITY): Payer: Self-pay | Admitting: Emergency Medicine

## 2013-12-28 DIAGNOSIS — I10 Essential (primary) hypertension: Secondary | ICD-10-CM | POA: Diagnosis not present

## 2013-12-28 DIAGNOSIS — Z3202 Encounter for pregnancy test, result negative: Secondary | ICD-10-CM | POA: Diagnosis not present

## 2013-12-28 DIAGNOSIS — Z79899 Other long term (current) drug therapy: Secondary | ICD-10-CM | POA: Insufficient documentation

## 2013-12-28 DIAGNOSIS — F3289 Other specified depressive episodes: Secondary | ICD-10-CM | POA: Diagnosis not present

## 2013-12-28 DIAGNOSIS — Z87891 Personal history of nicotine dependence: Secondary | ICD-10-CM | POA: Insufficient documentation

## 2013-12-28 DIAGNOSIS — R51 Headache: Secondary | ICD-10-CM | POA: Diagnosis not present

## 2013-12-28 DIAGNOSIS — Z872 Personal history of diseases of the skin and subcutaneous tissue: Secondary | ICD-10-CM | POA: Insufficient documentation

## 2013-12-28 DIAGNOSIS — H539 Unspecified visual disturbance: Secondary | ICD-10-CM | POA: Insufficient documentation

## 2013-12-28 DIAGNOSIS — R519 Headache, unspecified: Secondary | ICD-10-CM

## 2013-12-28 DIAGNOSIS — F329 Major depressive disorder, single episode, unspecified: Secondary | ICD-10-CM | POA: Diagnosis not present

## 2013-12-28 DIAGNOSIS — K219 Gastro-esophageal reflux disease without esophagitis: Secondary | ICD-10-CM | POA: Insufficient documentation

## 2013-12-28 DIAGNOSIS — R079 Chest pain, unspecified: Secondary | ICD-10-CM | POA: Insufficient documentation

## 2013-12-28 LAB — CBC
HEMATOCRIT: 40.4 % (ref 36.0–46.0)
Hemoglobin: 12.8 g/dL (ref 12.0–15.0)
MCH: 24.4 pg — AB (ref 26.0–34.0)
MCHC: 31.7 g/dL (ref 30.0–36.0)
MCV: 77.1 fL — ABNORMAL LOW (ref 78.0–100.0)
PLATELETS: 339 10*3/uL (ref 150–400)
RBC: 5.24 MIL/uL — ABNORMAL HIGH (ref 3.87–5.11)
RDW: 13.1 % (ref 11.5–15.5)
WBC: 8.2 10*3/uL (ref 4.0–10.5)

## 2013-12-28 LAB — BASIC METABOLIC PANEL
ANION GAP: 14 (ref 5–15)
BUN: 7 mg/dL (ref 6–23)
CALCIUM: 9.9 mg/dL (ref 8.4–10.5)
CO2: 25 meq/L (ref 19–32)
CREATININE: 0.83 mg/dL (ref 0.50–1.10)
Chloride: 99 mEq/L (ref 96–112)
GFR calc Af Amer: >90 mL/min (ref >90)
GFR calc non Af Amer: 90 mL/min — ABNORMAL LOW (ref >90)
Glucose, Bld: 96 mg/dL (ref 70–99)
Potassium: 4.4 mEq/L (ref 3.7–5.3)
Sodium: 138 mEq/L (ref 137–147)

## 2013-12-28 LAB — I-STAT TROPONIN, ED: Troponin i, poc: 0 ng/mL (ref 0.00–0.08)

## 2013-12-28 MED ORDER — ONDANSETRON HCL 4 MG/2ML IJ SOLN
4.0000 mg | Freq: Once | INTRAMUSCULAR | Status: AC
Start: 2013-12-28 — End: 2013-12-28
  Administered 2013-12-28: 4 mg via INTRAVENOUS
  Filled 2013-12-28: qty 2

## 2013-12-28 MED ORDER — ASPIRIN 325 MG PO TABS
325.0000 mg | ORAL_TABLET | ORAL | Status: AC
  Administered 2013-12-28: 325 mg via ORAL
  Filled 2013-12-28: qty 1

## 2013-12-28 MED ORDER — SODIUM CHLORIDE 0.9 % IV BOLUS (SEPSIS)
1000.0000 mL | Freq: Once | INTRAVENOUS | Status: AC
  Administered 2013-12-28: 1000 mL via INTRAVENOUS

## 2013-12-28 MED ORDER — KETOROLAC TROMETHAMINE 30 MG/ML IJ SOLN
30.0000 mg | Freq: Once | INTRAMUSCULAR | Status: AC
  Administered 2013-12-28: 30 mg via INTRAVENOUS
  Filled 2013-12-28: qty 1

## 2013-12-28 NOTE — ED Provider Notes (Signed)
CSN: [id number]     Arrival date & time 12/28/13  2129 History   First MD Initiated Contact with Patient 12/28/13 2234     Chief Complaint  Patient presents with  . Chest Pain  . Headache     (Consider location/radiation/quality/duration/timing/severity/associated sxs/prior Treatment) HPI Comments: The patient is a 65 or a female with possible history of hypertension, reflux, presenting to emergency room chief complaint of left-sided chest pain and headache since last night. The patient reports ocular headache. She reports blurry vision today, resolved.  Also complains of BP being elevated at 150/100. Also complains of left-sided chest discomfort, worsened with laying back. Relieved with sitting up. Reports similar chest pain in the past with previous admission. Non-pleuritic, nonexertional. Denies shortness breath, cough, chills, nausea, vomiting, abdominal pain. Has not taken pain medication for back or headache since onset. [facility]  Patient is a 38 y.o. female presenting with chest pain and headaches. The history is provided by the patient. No language interpreter was used.  Chest Pain Associated symptoms: headache   Associated symptoms: no abdominal pain, no cough, no fever, no nausea, no palpitations, no shortness of breath and not vomiting   Headache Associated symptoms: no abdominal pain, no congestion, no cough, no fever, no nausea, no photophobia and no vomiting     Past Medical History  Diagnosis Date  . Eczema   . Hypertension   . Depression   . Gestational hypertension   . GERD (gastroesophageal reflux disease)    Past Surgical History  Procedure Laterality Date  . No past surgeries     Family History  Problem Relation Age of Onset  . Anesthesia problems Neg Hx   . Hypertension Mother   . Stroke Mother   . Diabetes Maternal Grandmother   . Hypertension Maternal Grandmother   . Cancer Maternal Aunt     breast cancer    History  Substance Use  Topics  . Smoking status: Former Games developer  . Smokeless tobacco: Never Used  . Alcohol Use: No   OB History   Grav Para Term Preterm Abortions TAB SAB Ect Mult Living   4 4 4  0 0 0 0 0 0 4     Review of Systems  Constitutional: Negative for fever and chills.  HENT: Negative for congestion and rhinorrhea.   Eyes: Positive for visual disturbance. Negative for photophobia.  Respiratory: Negative for cough and shortness of breath.   Cardiovascular: Positive for chest pain. Negative for palpitations and leg swelling.  Gastrointestinal: Negative for nausea, vomiting and abdominal pain.  Neurological: Positive for headaches. Negative for syncope.      Allergies  Review of patient's allergies indicates no known allergies.  Home Medications   Prior to Admission medications   Medication Sig Start Date End Date Taking? Authorizing Provider  amLODipine (NORVASC) 5 MG tablet Take 1 tablet (5 mg total) by mouth daily. 12/17/13  Yes Zannie Cove, MD  FLUoxetine (PROZAC) 20 MG capsule Take 20 mg by mouth at bedtime.    Yes Historical Provider, MD  Multiple Vitamins-Minerals (MULTIVITAMIN WITH MINERALS) tablet Take 1 tablet by mouth daily.   Yes Historical Provider, MD  pantoprazole (PROTONIX) 40 MG tablet Take 1 tablet (40 mg total) by mouth daily. 12/17/13  Yes Zannie Cove, MD  Vitamin D, Ergocalciferol, (DRISDOL) 50000 UNITS CAPS capsule Take 50,000 Units by mouth every 7 (seven) days. Friday 12/23/13  Yes Doris Cheadle, MD  levonorgestrel (MIRENA) 20 MCG/24HR IUD 1 each by Intrauterine route once.  Historical Provider, MD   BP 130/88  Pulse 90  Temp(Src) 98.8 F (37.1 C) (Oral)  Resp 16  Ht 5\' 2"  (1.575 m)  Wt 217 lb (98.431 kg)  BMI 39.68 kg/m2  SpO2 99%  LMP 12/16/2013 Physical Exam  Nursing note and vitals reviewed. Constitutional: She is oriented to person, place, and time. She appears well-developed and well-nourished. No distress.  Cardiovascular: Normal rate and regular  rhythm.   Pulses:      Radial pulses are 2+ on the right side, and 2+ on the left side.  No lower extremity edema.  Pulmonary/Chest: Effort normal and breath sounds normal. No respiratory distress. She has no decreased breath sounds. She has no wheezes. She has no rhonchi.  Patient is able to speak in complete sentences.  Left upper back discomfort reproducible with palpation  Abdominal: Soft. There is no tenderness. There is no rebound.  Neurological: She is alert and oriented to person, place, and time. No cranial nerve deficit or sensory deficit. Coordination normal. GCS eye subscore is 4. GCS verbal subscore is 5. GCS motor subscore is 6.  Speech is clear and goal oriented, follows commands Cranial nerves III - XII grossly intact, no facial droop Normal strength in upper and lower extremities bilaterally, strong and equal grip strength Sensation normal to light touch Moves all 4 extremities without ataxia, coordination intact  Skin: Skin is warm and dry. She is not diaphoretic.    ED Course  Procedures (including critical care time) Visual Acuity - Bilateral Distance: 30 (With Glasses) ; R Distance: 30 ; L Distance: 25   Labs Review Labs Reviewed  CBC - Abnormal; Notable for the following:    RBC 5.24 (*)    MCV 77.1 (*)    MCH 24.4 (*)    All other components within normal limits  BASIC METABOLIC PANEL - Abnormal; Notable for the following:    GFR calc non Af Amer 90 (*)    All other components within normal limits  I-STAT TROPOININ, ED  POC URINE PREG, ED    Imaging Review Dg Chest 1 View  12/28/2013   CLINICAL DATA:  Chest pain and headache.  EXAM: CHEST - 1 VIEW  COMPARISON:  12/16/2013  FINDINGS: The heart size and mediastinal contours are within normal limits. Both lungs are clear. The visualized skeletal structures are unremarkable.  IMPRESSION: No active disease.   Electronically Signed   By: Burman NievesWilliam  Stevens M.D.   On: 12/28/2013 22:28     EKG  Interpretation   Date/Time:  Tuesday December 28 2013 21:51:09 EDT Ventricular Rate:  81 PR Interval:  165 QRS Duration: 85 QT Interval:  362 QTC Calculation: 420 R Axis:   68 Text Interpretation:  Sinus rhythm Probable LVH with secondary repol abnrm  Baseline wander in lead(s) V4 V5 V6 No significant change since last  tracing Confirmed by WARD,  DO, KRISTEN ([id number]) on 12/29/2013 12:40:41 AM      MDM   Final diagnoses:  Chest pain, unspecified chest pain type  Headache around the eyes   Patient presents with left-sided chest discomfort and headache. No neurologic deficits on exam, no meningeal signs. Reports blurry vision has resolved. Per EMR the patient has had a negative stress tests the last several months, negative echo. Patients discomfort is positional, palpation of the left trap recreates discomfort. Re-evaluation pt reports no relief of symptoms with medication. Reevaluation patient resting comfortably in room, reports resolution of chest discomfort and headache. Plan to discharge home.  Discussed lab results, imaging results, and treatment plan with the patient. Return precautions given. Reports understanding and no other concerns at this time.  Patient is stable for discharge at this time. Meds given in ED:  Medications  aspirin tablet 325 mg (325 mg Oral Given 12/28/13 2150)  ketorolac (TORADOL) 30 MG/ML injection 30 mg (30 mg Intravenous Given 12/28/13 2340)  sodium chloride 0.9 % bolus 1,000 mL (0 mLs Intravenous Stopped 12/29/13 0035)  ondansetron (ZOFRAN) injection 4 mg (4 mg Intravenous Given 12/28/13 2340)  metoCLOPramide (REGLAN) injection 10 mg (10 mg Intravenous Given 12/29/13 0026)  diphenhydrAMINE (BENADRYL) injection 25 mg (25 mg Intravenous Given 12/29/13 0026)  dexamethasone (DECADRON) injection 10 mg (10 mg Intravenous Given 12/29/13 0026)  gi cocktail (Maalox,Lidocaine,Donnatal) (30 mLs Oral Given 12/29/13 0040)    Discharge Medication List as of 12/29/2013  1:46  AM        Leotis Shames Doretha Imus, PA-C 12/30/13 0865

## 2013-12-28 NOTE — ED Notes (Signed)
Pt states she has hx of hypertension and began having dizziness, headache, and blurred vision last night. States she has some pain in her left chest laterally and had some diaphoresis earlier today. Pt says her BP earlier was 150's/100's. Upon assessment here, pt is calm and in NAD. Pain has diminished, no diaphoresis, or n/v noted. Pt does still c/o HA and dizziness.

## 2013-12-29 LAB — POC URINE PREG, ED: Preg Test, Ur: NEGATIVE

## 2013-12-29 MED ORDER — GI COCKTAIL ~~LOC~~
30.0000 mL | Freq: Once | ORAL | Status: AC
  Administered 2013-12-29: 30 mL via ORAL
  Filled 2013-12-29: qty 30

## 2013-12-29 MED ORDER — DIPHENHYDRAMINE HCL 50 MG/ML IJ SOLN
25.0000 mg | Freq: Once | INTRAMUSCULAR | Status: AC
  Administered 2013-12-29: 25 mg via INTRAVENOUS
  Filled 2013-12-29: qty 1

## 2013-12-29 MED ORDER — DEXAMETHASONE SODIUM PHOSPHATE 10 MG/ML IJ SOLN
10.0000 mg | Freq: Once | INTRAMUSCULAR | Status: AC
  Administered 2013-12-29: 10 mg via INTRAVENOUS
  Filled 2013-12-29: qty 1

## 2013-12-29 MED ORDER — METOCLOPRAMIDE HCL 5 MG/ML IJ SOLN
10.0000 mg | INTRAMUSCULAR | Status: AC
  Administered 2013-12-29: 10 mg via INTRAVENOUS
  Filled 2013-12-29: qty 2

## 2013-12-29 NOTE — ED Notes (Signed)
POC preg test NEGATIVE. 

## 2013-12-29 NOTE — Discharge Instructions (Signed)
Call for a follow up appointment with a Family or Primary Care Provider.  °Return if Symptoms worsen.   °Take medication as prescribed.  ° °

## 2013-12-29 NOTE — ED Notes (Signed)
Pt aware of need of urine sample. Unable to provide sample at this time. Will notify staff when able to void

## 2013-12-30 NOTE — ED Provider Notes (Signed)
Medical screening examination/treatment/procedure(s) were performed by non-physician practitioner and as supervising physician I was immediately available for consultation/collaboration.   EKG Interpretation   Date/Time:  Tuesday December 28 2013 21:51:09 EDT Ventricular Rate:  81 PR Interval:  165 QRS Duration: 85 QT Interval:  362 QTC Calculation: 420 R Axis:   68 Text Interpretation:  Sinus rhythm Probable LVH with secondary repol abnrm  Baseline wander in lead(s) V4 V5 V6 No significant change since last  tracing Confirmed by Haward Pope,  DO, Kyser Wandel ([id number]) on 12/29/2013 12:40:41 AM        Layla MawKristen N Cosimo Schertzer, DO 12/30/13 [phone]

## 2014-01-05 ENCOUNTER — Encounter: Payer: Medicaid Other | Admitting: Internal Medicine

## 2014-01-06 ENCOUNTER — Ambulatory Visit: Payer: Medicaid Other | Attending: Internal Medicine | Admitting: Internal Medicine

## 2014-01-06 ENCOUNTER — Other Ambulatory Visit (HOSPITAL_COMMUNITY)
Admission: RE | Admit: 2014-01-06 | Discharge: 2014-01-06 | Disposition: A | Discharge status: Home / Self Care | Payer: Medicaid Other | Source: Ambulatory Visit | Attending: Internal Medicine | Admitting: Internal Medicine

## 2014-01-06 ENCOUNTER — Encounter: Payer: Self-pay | Admitting: Internal Medicine

## 2014-01-06 VITALS — BP 115/73 | HR 98 | Temp 97.7°F | Resp 18 | Ht 62.0 in | Wt 208.8 lb

## 2014-01-06 DIAGNOSIS — L259 Unspecified contact dermatitis, unspecified cause: Secondary | ICD-10-CM | POA: Insufficient documentation

## 2014-01-06 DIAGNOSIS — R0602 Shortness of breath: Secondary | ICD-10-CM | POA: Insufficient documentation

## 2014-01-06 DIAGNOSIS — Z113 Encounter for screening for infections with a predominantly sexual mode of transmission: Secondary | ICD-10-CM | POA: Diagnosis present

## 2014-01-06 DIAGNOSIS — I10 Essential (primary) hypertension: Secondary | ICD-10-CM | POA: Insufficient documentation

## 2014-01-06 DIAGNOSIS — Z124 Encounter for screening for malignant neoplasm of cervix: Secondary | ICD-10-CM

## 2014-01-06 DIAGNOSIS — R209 Unspecified disturbances of skin sensation: Secondary | ICD-10-CM | POA: Diagnosis present

## 2014-01-06 DIAGNOSIS — Z1151 Encounter for screening for human papillomavirus (HPV): Secondary | ICD-10-CM | POA: Diagnosis present

## 2014-01-06 DIAGNOSIS — K219 Gastro-esophageal reflux disease without esophagitis: Secondary | ICD-10-CM | POA: Insufficient documentation

## 2014-01-06 DIAGNOSIS — Z01419 Encounter for gynecological examination (general) (routine) without abnormal findings: Secondary | ICD-10-CM | POA: Diagnosis present

## 2014-01-06 DIAGNOSIS — F411 Generalized anxiety disorder: Secondary | ICD-10-CM | POA: Diagnosis not present

## 2014-01-06 DIAGNOSIS — F329 Major depressive disorder, single episode, unspecified: Secondary | ICD-10-CM | POA: Insufficient documentation

## 2014-01-06 DIAGNOSIS — F3289 Other specified depressive episodes: Secondary | ICD-10-CM | POA: Insufficient documentation

## 2014-01-06 DIAGNOSIS — Z79899 Other long term (current) drug therapy: Secondary | ICD-10-CM | POA: Insufficient documentation

## 2014-01-06 DIAGNOSIS — Z30431 Encounter for routine checking of intrauterine contraceptive device: Secondary | ICD-10-CM

## 2014-01-06 DIAGNOSIS — Z0000 Encounter for general adult medical examination without abnormal findings: Secondary | ICD-10-CM | POA: Diagnosis not present

## 2014-01-06 DIAGNOSIS — Z87891 Personal history of nicotine dependence: Secondary | ICD-10-CM | POA: Diagnosis not present

## 2014-01-06 DIAGNOSIS — N760 Acute vaginitis: Secondary | ICD-10-CM | POA: Diagnosis present

## 2014-01-06 LAB — POCT URINALYSIS DIPSTICK
BILIRUBIN UA: NEGATIVE
Blood, UA: NEGATIVE
Glucose, UA: NEGATIVE
Ketones, UA: 40
LEUKOCYTES UA: NEGATIVE
NITRITE UA: NEGATIVE
PH UA: 5.5
Protein, UA: 30
Spec Grav, UA: >=1.03
Urobilinogen, UA: 1

## 2014-01-06 MED ORDER — PANTOPRAZOLE SODIUM 40 MG PO TBEC: 40.0000 mg | DELAYED_RELEASE_TABLET | Freq: Every day | ORAL | Status: DC

## 2014-01-06 MED ORDER — AMLODIPINE BESYLATE 5 MG PO TABS: 5.0000 mg | ORAL_TABLET | Freq: Every day | ORAL | Status: DC

## 2014-01-06 NOTE — Progress Notes (Signed)
Patient presents for PE, pap and breast exam States has been seen in ED 3 times in last 3 weeks for chest pain C/O 1 week history of tingling on left side of body States hit her head on door 2 weeks ago States 1 week history of headache left occiput States 2 week history of dyspnea when walking up stairs PHQ-9 score of 14. Patient states that she has appt scheduled for counseling.

## 2014-01-06 NOTE — Progress Notes (Signed)
Patient ID: Whitney White, female   DOB: 04/27/1976, 38 y.o.   MRN: [id number]  CC: "pap-smear, breast exam and left sided tingling"  ---HPI: Whitney White presents for a pap smear, breast exam and left sided tingling. Whitney White has many concerns today and seems to be anxious. Recently she reports going to the emergency department on 12-28-13 for chest pain. Where they ruled out Cardiac involvement. Today she reports having intermittent left sided tingling and shortness of breath that started two weeks ago. She reports the pain as a 3 on a scale of 0-10. She is concerned that this is related to a pulmonary embolism. Her past medical history is positive for hypertension, depression and anxiety. She is currently followed by behavioral health for her depression and anxiety and reports having an appointment August the 19th. She states her cycles are regular and her last menstrual period was July 16th. She currently uses the Mirena IUD for contraception.    No Known Allergies Past Medical History  Diagnosis Date  . Eczema   . Hypertension   . Depression   . Gestational hypertension   . GERD (gastroesophageal reflux disease)    Current Outpatient Prescriptions on File Prior to Visit  Medication Sig Dispense Refill  . amLODipine (NORVASC) 5 MG tablet Take 1 tablet (5 mg total) by mouth daily.  30 tablet  0  . FLUoxetine (PROZAC) 20 MG capsule Take 20 mg by mouth at bedtime.       Marland Kitchen levonorgestrel (MIRENA) 20 MCG/24HR IUD 1 each by Intrauterine route once.      . Multiple Vitamins-Minerals (MULTIVITAMIN WITH MINERALS) tablet Take 1 tablet by mouth daily.      . pantoprazole (PROTONIX) 40 MG tablet Take 1 tablet (40 mg total) by mouth daily.  30 tablet  0  . Vitamin D, Ergocalciferol, (DRISDOL) 50000 UNITS CAPS capsule Take 50,000 Units by mouth every 7 (seven) days. Friday       No current facility-administered medications on file prior to visit.   Family History  Problem Relation Age of Onset  .  Anesthesia problems Neg Hx   . Hypertension Mother   . Stroke Mother   . Diabetes Maternal Grandmother   . Hypertension Maternal Grandmother   . Cancer Maternal Aunt     breast cancer    History   Social History  . Marital Status: Divorced    Spouse Name: N/A    Number of Children: N/A  . Years of Education: N/A   Occupational History  . Not on file.   Social History Main Topics  . Smoking status: Former Games developer  . Smokeless tobacco: Never Used  . Alcohol Use: No  . Drug Use: No  . Sexual Activity: Not Currently    Birth Control/ Protection: None, Other-see comments     Comment: merania removed   Other Topics Concern  . Not on file   Social History Narrative  . No narrative on file    Review of Systems  Constitutional: Positive for chills and malaise/fatigue.  Eyes: Positive for blurred vision and double vision.  Respiratory: Positive for cough and shortness of breath.   Cardiovascular: Positive for chest pain and palpitations.  Gastrointestinal: Positive for abdominal pain and constipation.  Genitourinary: Positive for frequency.  Musculoskeletal: Negative.   Skin: Positive for rash.  Neurological: Positive for dizziness, tingling and headaches.  Endo/Heme/Allergies: Negative.   Psychiatric/Behavioral: Positive for depression. Negative for suicidal ideas. The patient is nervous/anxious.  Objective:   Filed Vitals:   01/06/14 1131  BP: 115/73  Pulse: 98  Temp: 97.7 F (36.5 C)  Resp: 18    Physical Exam  Constitutional: She is oriented to person, place, and time. Vital signs are normal. She appears well-developed and well-nourished.  HENT:  Head: Normocephalic.  Eyes: Conjunctivae and lids are normal.  Neck: No thyromegaly present.  Cardiovascular: Normal rate, regular rhythm, S1 normal, S2 normal, normal heart sounds and intact distal pulses.   Pulmonary/Chest: Effort normal and breath sounds normal. Right breast exhibits no inverted nipple, no  mass, no nipple discharge, no skin change and no tenderness. Left breast exhibits no inverted nipple, no mass, no nipple discharge, no skin change and no tenderness. Breasts are symmetrical. There is no breast swelling.  Abdominal: Soft. Normal appearance and bowel sounds are normal. There is no tenderness.  Genitourinary: Vagina normal and uterus normal. No breast bleeding. Pelvic exam was performed with patient prone. There is no rash, tenderness, lesion or injury on the right labia. There is no rash, tenderness, lesion or injury on the left labia. Cervix exhibits friability. Cervix exhibits no motion tenderness and no discharge. Right adnexum displays no mass. Left adnexum displays no mass.  Patients cervix noted to be red and friable.  Neurological: She is alert and oriented to person, place, and time.  Skin: Skin is warm, dry and intact. No cyanosis. Nails show no clubbing.  Psychiatric: Her speech is normal and behavior is normal. Judgment and thought content normal. Her mood appears anxious. Cognition and memory are normal.     Lab Results  Component Value Date   WBC 8.2 12/28/2013   HGB 12.8 12/28/2013   HCT 40.4 12/28/2013   MCV 77.1* 12/28/2013   PLT 339 12/28/2013   Lab Results  Component Value Date   CREATININE 0.83 12/28/2013   BUN 7 12/28/2013   NA 138 12/28/2013   K 4.4 12/28/2013   CL 99 12/28/2013   CO2 25 12/28/2013    No results found for this basename: HGBA1C   Lipid Panel  No results found for this basename: chol, trig, hdl, cholhdl, vldl, ldlcalc       Assessment and plan:   Ignatius SpeckingShanni was seen today for gynecologic exam.  Diagnoses and associated orders for this visit:  Annual physical exam - Urinalysis Dipstick - Hemoglobin A1c - Lipid panel  Essential hypertension - amLODipine (NORVASC) 5 MG tablet; Take 1 tablet (5 mg total) by mouth daily.  Papanicolaou smear - Cytology - PAP Mazon - Cervicovaginal ancillary only - HIV antibody (with  reflex) - RPR  Family planning, IUD (intrauterine device) check/reinsertion/removal Strings visualized Anxiety state, unspecified [facility] disease, esophagitis presence not specified - pantoprazole (PROTONIX) 40 MG tablet; Take 1 tablet (40 mg total) by mouth daily.   Return in about 3 months (around 04/08/2014) for Hypertension.         Holland CommonsKECK, Campbell Kray, NP-[facility] [phone] 02/08/2014, 11:48 AM

## 2014-01-06 NOTE — Patient Instructions (Signed)
DASH Eating Plan °DASH stands for "Dietary Approaches to Stop Hypertension." The DASH eating plan is a healthy eating plan that has been shown to reduce high blood pressure (hypertension). Additional health benefits may include reducing the risk of type 2 diabetes mellitus, heart disease, and stroke. The DASH eating plan may also help with weight loss. °WHAT DO I NEED TO KNOW ABOUT THE DASH EATING PLAN? °For the DASH eating plan, you will follow these general guidelines: °· Choose foods with a percent daily value for sodium of less than 5% (as listed on the food label). °· Use salt-free seasonings or herbs instead of table salt or sea salt. °· Check with your health care provider or pharmacist before using salt substitutes. °· Eat lower-sodium products, often labeled as "lower sodium" or "no salt added." °· Eat fresh foods. °· Eat more vegetables, fruits, and low-fat dairy products. °· Choose whole grains. Look for the word "whole" as the first word in the ingredient list. °· Choose fish and skinless chicken or turkey more often than red meat. Limit fish, poultry, and meat to 6 oz (170 g) each day. °· Limit sweets, desserts, sugars, and sugary drinks. °· Choose heart-healthy fats. °· Limit cheese to 1 oz (28 g) per day. °· Eat more home-cooked food and less restaurant, buffet, and fast food. °· Limit fried foods. °· Cook foods using methods other than frying. °· Limit canned vegetables. If you do use them, rinse them well to decrease the sodium. °· When eating at a restaurant, ask that your food be prepared with less salt, or no salt if possible. °WHAT FOODS CAN I EAT? °Seek help from a dietitian for individual calorie needs. °Grains °Whole grain or whole wheat bread. Brown rice. Whole grain or whole wheat pasta. Quinoa, bulgur, and whole grain cereals. Low-sodium cereals. Corn or whole wheat flour tortillas. Whole grain cornbread. Whole grain crackers. Low-sodium crackers. °Vegetables °Fresh or frozen vegetables  (raw, steamed, roasted, or grilled). Low-sodium or reduced-sodium tomato and vegetable juices. Low-sodium or reduced-sodium tomato sauce and paste. Low-sodium or reduced-sodium canned vegetables.  °Fruits °All fresh, canned (in natural juice), or frozen fruits. °Meat and Other Protein Products °Ground beef (85% or leaner), grass-fed beef, or beef trimmed of fat. Skinless chicken or turkey. Ground chicken or turkey. Pork trimmed of fat. All fish and seafood. Eggs. Dried beans, peas, or lentils. Unsalted nuts and seeds. Unsalted canned beans. °Dairy °Low-fat dairy products, such as skim or 1% milk, 2% or reduced-fat cheeses, low-fat ricotta or cottage cheese, or plain low-fat yogurt. Low-sodium or reduced-sodium cheeses. °Fats and Oils °Tub margarines without trans fats. Light or reduced-fat mayonnaise and salad dressings (reduced sodium). Avocado. Safflower, olive, or canola oils. Natural peanut or almond butter. °Other °Unsalted popcorn and pretzels. °The items listed above may not be a complete list of recommended foods or beverages. Contact your dietitian for more options. °WHAT FOODS ARE NOT RECOMMENDED? °Grains °White bread. White pasta. White rice. Refined cornbread. Bagels and croissants. Crackers that contain trans fat. °Vegetables °Creamed or fried vegetables. Vegetables in a cheese sauce. Regular canned vegetables. Regular canned tomato sauce and paste. Regular tomato and vegetable juices. °Fruits °Dried fruits. Canned fruit in light or heavy syrup. Fruit juice. °Meat and Other Protein Products °Fatty cuts of meat. Ribs, chicken wings, bacon, sausage, bologna, salami, chitterlings, fatback, hot dogs, bratwurst, and packaged luncheon meats. Salted nuts and seeds. Canned beans with salt. °Dairy °Whole or 2% milk, cream, half-and-half, and cream cheese. Whole-fat or sweetened yogurt. Full-fat   cheeses or Mcgivern cheese. Nondairy creamers and whipped toppings. Processed cheese, cheese spreads, or cheese  curds. °Condiments °Onion and garlic salt, seasoned salt, table salt, and sea salt. Canned and packaged gravies. Worcestershire sauce. Tartar sauce. Barbecue sauce. Teriyaki sauce. Soy sauce, including reduced sodium. Steak sauce. Fish sauce. Oyster sauce. Cocktail sauce. Horseradish. Ketchup and mustard. Meat flavorings and tenderizers. Bouillon cubes. Hot sauce. Tabasco sauce. Marinades. Taco seasonings. Relishes. °Fats and Oils °Butter, stick margarine, lard, shortening, ghee, and bacon fat. Coconut, palm kernel, or palm oils. Regular salad dressings. °Other °Pickles and olives. Salted popcorn and pretzels. °The items listed above may not be a complete list of foods and beverages to avoid. Contact your dietitian for more information. °WHERE CAN I FIND MORE INFORMATION? °National Heart, [employer], [employer] Institute: www.nhlbi.nih.gov/health/health-topics/topics/dash/ °Document Released: 05/09/2011 Document Revised: 10/04/2013 Document Reviewed: 03/24/2013 °ExitCare® Patient Information ©2015 [employer]. This information is not intended to replace advice given to you by your health care provider. Make sure you discuss any questions you have with your health care provider. ° °

## 2014-01-07 LAB — LIPID PANEL
Cholesterol: 183 mg/dL (ref 0–200)
HDL: 41 mg/dL (ref >39)
LDL CALC: 125 mg/dL — AB (ref 0–99)
Total CHOL/HDL Ratio: 4.5 Ratio
Triglycerides: 86 mg/dL (ref <150)
VLDL: 17 mg/dL (ref 0–40)

## 2014-01-07 LAB — HEMOGLOBIN A1C
Hgb A1c MFr Bld: 5.8 % — ABNORMAL HIGH (ref <5.7)
Mean Plasma Glucose: 120 mg/dL — ABNORMAL HIGH (ref <117)

## 2014-01-07 LAB — CYTOLOGY - PAP

## 2014-01-07 LAB — RPR

## 2014-01-07 LAB — HIV ANTIBODY (ROUTINE TESTING W REFLEX): HIV 1&2 Ab, 4th Generation: NONREACTIVE

## 2014-01-18 ENCOUNTER — Other Ambulatory Visit: Payer: Self-pay | Admitting: Internal Medicine

## 2014-01-18 ENCOUNTER — Telehealth: Payer: Self-pay | Admitting: *Deleted

## 2014-01-18 MED ORDER — METRONIDAZOLE 500 MG PO TABS: 500.0000 mg | ORAL_TABLET | Freq: Two times a day (BID) | ORAL | Status: AC

## 2014-01-18 NOTE — Telephone Encounter (Signed)
Message copied by Fredderick SeveranceUCATTE, Charolette Bultman L on Tue Jan 18, 2014  2:09 PM ------      Message from: Holland CommonsKECK, VALERIE A      Created: Tue Jan 11, 2014 10:42 AM       Pap is negative for malignancies but positive for bacterial vaginosis. Please explained to patient that this is not an STD, she will need to take Flagyl 500 mg twice a day for 7 days. Please explained to patient no alcohol while on drug. HIV and syphilis is negative. Her cholesterol is slightly elevated educate patient on diet and exercise to lower cholesterol and A1c ------

## 2014-01-18 NOTE — Telephone Encounter (Signed)
Message copied by Fredderick SeveranceUCATTE, Megean Fabio L on Tue Jan 18, 2014  3:46 PM ------      Message from: Holland CommonsKECK, VALERIE A      Created: Tue Jan 11, 2014 10:42 AM       Pap is negative for malignancies but positive for bacterial vaginosis. Please explained to patient that this is not an STD, she will need to take Flagyl 500 mg twice a day for 7 days. Please explained to patient no alcohol while on drug. HIV and syphilis is negative. Her cholesterol is slightly elevated educate patient on diet and exercise to lower cholesterol and A1c ------

## 2014-01-18 NOTE — Telephone Encounter (Signed)
Left message on patient's VM to return call to discuss results.

## 2014-01-18 NOTE — Telephone Encounter (Signed)
Lab results and instructions reviewed with patient. Exercise and diet changes discussed at length. Patient states that she has already made many of these diet changes. Patient is aware of BV and that she may not drink alcohol while on Flagyl. [facility] [address]

## 2014-02-11 ENCOUNTER — Other Ambulatory Visit: Payer: Self-pay | Admitting: Internal Medicine

## 2014-02-14 ENCOUNTER — Telehealth: Payer: Self-pay | Admitting: Internal Medicine

## 2014-02-14 NOTE — Telephone Encounter (Signed)
Pt. Called to get some advice about some stomach pains she is having and she would like to get some advice on what to do. Please f/u with pt.

## 2014-02-15 ENCOUNTER — Encounter (HOSPITAL_COMMUNITY): Payer: Self-pay | Admitting: Emergency Medicine

## 2014-02-15 ENCOUNTER — Emergency Department (HOSPITAL_COMMUNITY)
Admission: EM | Admit: 2014-02-15 | Discharge: 2014-02-15 | Disposition: A | Discharge status: Home / Self Care | Payer: Medicaid Other | Attending: Emergency Medicine | Admitting: Emergency Medicine

## 2014-02-15 DIAGNOSIS — R109 Unspecified abdominal pain: Secondary | ICD-10-CM

## 2014-02-15 DIAGNOSIS — R110 Nausea: Secondary | ICD-10-CM | POA: Insufficient documentation

## 2014-02-15 DIAGNOSIS — F329 Major depressive disorder, single episode, unspecified: Secondary | ICD-10-CM | POA: Diagnosis not present

## 2014-02-15 DIAGNOSIS — K5900 Constipation, unspecified: Secondary | ICD-10-CM | POA: Diagnosis present

## 2014-02-15 DIAGNOSIS — K219 Gastro-esophageal reflux disease without esophagitis: Secondary | ICD-10-CM | POA: Diagnosis not present

## 2014-02-15 DIAGNOSIS — Z3202 Encounter for pregnancy test, result negative: Secondary | ICD-10-CM | POA: Insufficient documentation

## 2014-02-15 DIAGNOSIS — F3289 Other specified depressive episodes: Secondary | ICD-10-CM | POA: Diagnosis not present

## 2014-02-15 DIAGNOSIS — Z87891 Personal history of nicotine dependence: Secondary | ICD-10-CM | POA: Diagnosis not present

## 2014-02-15 DIAGNOSIS — Z79899 Other long term (current) drug therapy: Secondary | ICD-10-CM | POA: Insufficient documentation

## 2014-02-15 DIAGNOSIS — R197 Diarrhea, unspecified: Secondary | ICD-10-CM | POA: Diagnosis not present

## 2014-02-15 DIAGNOSIS — Z872 Personal history of diseases of the skin and subcutaneous tissue: Secondary | ICD-10-CM | POA: Diagnosis not present

## 2014-02-15 DIAGNOSIS — I10 Essential (primary) hypertension: Secondary | ICD-10-CM | POA: Insufficient documentation

## 2014-02-15 LAB — COMPREHENSIVE METABOLIC PANEL
ALBUMIN: 4.1 g/dL (ref 3.5–5.2)
ALT: 15 U/L (ref 0–35)
AST: 18 U/L (ref 0–37)
Alkaline Phosphatase: 53 U/L (ref 39–117)
Anion gap: 15 (ref 5–15)
BILIRUBIN TOTAL: 0.7 mg/dL (ref 0.3–1.2)
BUN: 9 mg/dL (ref 6–23)
CHLORIDE: 99 meq/L (ref 96–112)
CO2: 26 mEq/L (ref 19–32)
Calcium: 9.2 mg/dL (ref 8.4–10.5)
Creatinine, Ser: 0.87 mg/dL (ref 0.50–1.10)
GFR calc Af Amer: >90 mL/min (ref >90)
GFR calc non Af Amer: 85 mL/min — ABNORMAL LOW (ref >90)
Glucose, Bld: 77 mg/dL (ref 70–99)
POTASSIUM: 3.7 meq/L (ref 3.7–5.3)
Sodium: 140 mEq/L (ref 137–147)
Total Protein: 7.6 g/dL (ref 6.0–8.3)

## 2014-02-15 LAB — CBC WITH DIFFERENTIAL/PLATELET
BASOS ABS: 0 10*3/uL (ref 0.0–0.1)
Basophils Relative: 0 % (ref 0–1)
Eosinophils Absolute: 0.1 10*3/uL (ref 0.0–0.7)
Eosinophils Relative: 1 % (ref 0–5)
HCT: 38.5 % (ref 36.0–46.0)
Hemoglobin: 12.1 g/dL (ref 12.0–15.0)
LYMPHS PCT: 23 % (ref 12–46)
Lymphs Abs: 1.8 10*3/uL (ref 0.7–4.0)
MCH: 24.1 pg — ABNORMAL LOW (ref 26.0–34.0)
MCHC: 31.4 g/dL (ref 30.0–36.0)
MCV: 76.7 fL — ABNORMAL LOW (ref 78.0–100.0)
MONOS PCT: 6 % (ref 3–12)
Monocytes Absolute: 0.5 10*3/uL (ref 0.1–1.0)
NEUTROS ABS: 5.6 10*3/uL (ref 1.7–7.7)
NEUTROS PCT: 70 % (ref 43–77)
Platelets: 309 10*3/uL (ref 150–400)
RBC: 5.02 MIL/uL (ref 3.87–5.11)
RDW: 13.4 % (ref 11.5–15.5)
WBC: 7.9 10*3/uL (ref 4.0–10.5)

## 2014-02-15 LAB — URINALYSIS, ROUTINE W REFLEX MICROSCOPIC
Glucose, UA: NEGATIVE mg/dL
HGB URINE DIPSTICK: NEGATIVE
Ketones, ur: >80 mg/dL — AB
Nitrite: NEGATIVE
PH: 6 (ref 5.0–8.0)
Protein, ur: NEGATIVE mg/dL
SPECIFIC GRAVITY, URINE: 1.029 (ref 1.005–1.030)
Urobilinogen, UA: 1 mg/dL (ref 0.0–1.0)

## 2014-02-15 LAB — LIPASE, BLOOD: Lipase: 14 U/L (ref 11–59)

## 2014-02-15 LAB — URINE MICROSCOPIC-ADD ON

## 2014-02-15 LAB — PREGNANCY, URINE: Preg Test, Ur: NEGATIVE

## 2014-02-15 MED ORDER — SODIUM CHLORIDE 0.9 % IV BOLUS (SEPSIS)
500.0000 mL | Freq: Once | INTRAVENOUS | Status: AC
  Administered 2014-02-15: 500 mL via INTRAVENOUS

## 2014-02-15 NOTE — ED Notes (Signed)
Pt attempted to provide urine sample but unable to provide, sts she had just used the bathroom prior to coming in.

## 2014-02-15 NOTE — ED Provider Notes (Signed)
CSN: [id number]     Arrival date & time 02/15/14  1004 History   First MD Initiated Contact with Patient 02/15/14 1028     Chief Complaint  Patient presents with  . Abdominal Pain     (Consider location/radiation/quality/duration/timing/severity/associated sxs/prior Treatment) HPI Pt is a 38yo female with hx of HTN, depression, and GERD presenting to ED with c/o intermittent severe abdominal cramping x2 days, associated with nausea and diarrhea. Pt states BMs do not look like regular stool, states "it looks like jelly or tissue"  Pain is 8/10 at worst. Pain improves after BMs.  States BMs are very watery.  States this morning she had a BM that was "green liquid." Pt states she has had issues with constipation since July 2015, she has tried dulcolax and mag citrate at home as well as "a whole bag of prunes" over last few weeks without relief.  Pt states she goes 4-5 days w/o a normal bowel movement.  Denies urinary or vaginal symptoms. Reports nausea but no vomiting, fever or chills. No sick contacts or recent travel.     Past Medical History  Diagnosis Date  . Eczema   . Hypertension   . Depression   . Gestational hypertension   . GERD (gastroesophageal reflux disease)    Past Surgical History  Procedure Laterality Date  . No past surgeries     Family History  Problem Relation Age of Onset  . Anesthesia problems Neg Hx   . Hypertension Mother   . Stroke Mother   . Diabetes Maternal Grandmother   . Hypertension Maternal Grandmother   . Cancer Maternal Aunt     breast cancer    History  Substance Use Topics  . Smoking status: Former Games developer  . Smokeless tobacco: Never Used  . Alcohol Use: No   OB History   Grav Para Term Preterm Abortions TAB SAB Ect Mult Living   0 0 0 0 0 0 4     Review of Systems  Constitutional: Negative for fever and chills.  Respiratory: Negative for cough and shortness of breath.   Cardiovascular: Negative for chest pain and palpitations.   Gastrointestinal: Positive for nausea, abdominal pain, diarrhea and constipation. Negative for vomiting and blood in stool.  Genitourinary: Negative for dysuria, urgency, hematuria and flank pain.  All other systems reviewed and are negative.     Allergies  Review of patient's allergies indicates no known allergies.  Home Medications   Prior to Admission medications   Medication Sig Start Date End Date Taking? Authorizing Provider  amLODipine (NORVASC) 5 MG tablet Take 1 tablet (5 mg total) by mouth daily. 01/06/14  Yes [name], NP  clonazePAM (KLONOPIN) 0.5 MG tablet Take 0.5 mg by mouth 2 (two) times daily as needed for anxiety.   Yes Historical Provider, MD  Flaxseed, Linseed, (FLAX SEED OIL) 1000 MG CAPS Take 1 capsule by mouth daily.   Yes Historical Provider, MD  FLUoxetine (PROZAC) 40 MG capsule Take 40 mg by mouth daily.   Yes Historical Provider, MD  levonorgestrel (MIRENA) 20 MCG/24HR IUD 1 each by Intrauterine route once.   Yes Historical Provider, MD  Multiple Vitamins-Minerals (MULTIVITAMIN WITH MINERALS) tablet Take 1 tablet by mouth daily.   Yes Historical Provider, MD  Omega-3 Fatty Acids (FISH OIL) 1000 MG CAPS Take 2 capsules by mouth daily.   Yes Historical Provider, MD  pantoprazole (PROTONIX) 40 MG tablet Take 1 tablet (40 mg total) by mouth daily. 01/06/14  Yes [name], NP  Vitamin D, Ergocalciferol, (DRISDOL) 50000 UNITS CAPS capsule Take 50,000 Units by mouth every 7 (seven) days. Friday 12/23/13  Yes Deepak Advani, MD   BP 115/89  Pulse 92  Temp(Src) 98.1 F (36.7 C) (Oral)  Resp 18  SpO2 97% Physical Exam  Nursing note and vitals reviewed. Constitutional: She appears well-developed and well-nourished. No distress.  Pt lying comfortably in exam bed, NAD.   HENT:  Head: Normocephalic and atraumatic.  Eyes: Conjunctivae are normal. No scleral icterus.  Neck: Normal range of motion.  Cardiovascular: Normal rate, regular rhythm and normal heart  sounds.   Pulmonary/Chest: Effort normal and breath sounds normal. No respiratory distress. She has no wheezes. She has no rales. She exhibits no tenderness.  Abdominal: Soft. Bowel sounds are normal. She exhibits no distension and no mass. There is tenderness. There is no rebound and no guarding.  Obese abdomen. Soft, mild epigastric tenderness and tenderness in LLQ. No rebound or guarding.   Musculoskeletal: Normal range of motion.  Neurological: She is alert.  Skin: Skin is warm and dry. She is not diaphoretic.    ED Course  Procedures (including critical care time) Labs Review Labs Reviewed  CBC WITH DIFFERENTIAL - Abnormal; Notable for the following:    MCV 76.7 (*)    MCH 24.1 (*)    All other components within normal limits  COMPREHENSIVE METABOLIC PANEL - Abnormal; Notable for the following:    GFR calc non Af Amer 85 (*)    All other components within normal limits  URINALYSIS, ROUTINE W REFLEX MICROSCOPIC - Abnormal; Notable for the following:    Color, Urine AMBER (*)    APPearance CLOUDY (*)    Bilirubin Urine MODERATE (*)    Ketones, ur >80 (*)    Leukocytes, UA SMALL (*)    All other components within normal limits  URINE MICROSCOPIC-ADD ON - Abnormal; Notable for the following:    Squamous Epithelial / LPF MANY (*)    All other components within normal limits  STOOL CULTURE  LIPASE, BLOOD  PREGNANCY, URINE    Imaging Review No results found.   EKG Interpretation None      MDM   Final diagnoses:  Abdominal cramping  Diarrhea  Constipation, unspecified constipation type    pt is a 38yo female presenting to ED with c/o abdominal cramping and diarrhea x2 days.  Associated nausea.  On exam, pt appears well, non-toxic. NAD. Abdominal exam, mild LLQ tenderness. No rebound or guarding.  Labs: proteinuria, otherwise unremarkable.  IV fluids given in ED. Not concerned for emergent process taking place at this time including diverticulitis, SBO,  appendicitis, or bowel perforation.  Stool culture sent due to reports of "green watery stool"  Advised to f/u with her PCP in 2 days for recheck of symptoms. Home care instructions for constipation, diarrhea, and IBS provided. Return precautions provided. Pt verbalized understanding and agreement with tx plan.  Discussed pt with Dr. Rubin Payor who agrees with tx plan.     Junius Finner, PA-C 02/15/14 1318

## 2014-02-15 NOTE — ED Notes (Signed)
Pt presents to department for evaluation of abdominal cramping and diarrhea. Ongoing for several days. Pt states "jelly" colored stools. 8/10 abdominal cramping. Pt is alert and oriented x4. NAD.

## 2014-02-15 NOTE — ED Notes (Signed)
Pt c/o severe lower abd cramps and diarrhea that started on Sunday. Pt reports mucus like stools. sts that the cramps became more intermittent and started to feel better. Then this morning cramps started to come back and had another episode of diarrhea that was "green liquid" stool. sts had some nausea but never vomited. Denies use of blood thinners/hx of stomach ulcers. Pt sts a few weeks ago she was very constipated and tried multiple OTC medications to get her bowels moving, sts it helped some and she has also increased her fiber intake. Denies fever/chills at home. Denies other friends/family members being sick. Nad, skin warm and dry, resp e/u.

## 2014-02-15 NOTE — ED Notes (Signed)
Pt drinking sprite  

## 2014-02-16 ENCOUNTER — Telehealth: Payer: Self-pay | Admitting: Emergency Medicine

## 2014-02-16 ENCOUNTER — Encounter: Payer: Self-pay | Admitting: Internal Medicine

## 2014-02-16 NOTE — Telephone Encounter (Signed)
Called to check up on pt s/p ER visit 02/15/14 for abdominal pain with bowel changes. Pt states she is feeling better but still has nausea after eating solid diet. Informed pt to start with BRAT diet then as tolerated. Pt also given f/u appointment 03/02/14 with Vikki Ports for moderate bilirubin found in urine

## 2014-02-16 NOTE — ED Provider Notes (Signed)
Medical screening examination/treatment/procedure(s) were performed by non-physician practitioner and as supervising physician I was immediately available for consultation/collaboration.   EKG Interpretation None       Juliet Rude. Rubin Payor, MD 02/16/14 [phone]

## 2014-02-19 LAB — STOOL CULTURE

## 2014-02-20 ENCOUNTER — Other Ambulatory Visit: Payer: Self-pay | Admitting: Internal Medicine

## 2014-02-21 ENCOUNTER — Other Ambulatory Visit (HOSPITAL_COMMUNITY): Payer: Self-pay | Admitting: Internal Medicine

## 2014-02-21 ENCOUNTER — Other Ambulatory Visit: Payer: Self-pay | Admitting: Internal Medicine

## 2014-03-02 ENCOUNTER — Ambulatory Visit: Payer: Medicaid Other | Attending: Internal Medicine | Admitting: Internal Medicine

## 2014-03-02 ENCOUNTER — Encounter: Payer: Self-pay | Admitting: Internal Medicine

## 2014-03-02 VITALS — BP 110/76 | HR 71 | Temp 98.5°F | Resp 16 | Ht 61.5 in | Wt 216.0 lb

## 2014-03-02 DIAGNOSIS — Z0000 Encounter for general adult medical examination without abnormal findings: Secondary | ICD-10-CM

## 2014-03-02 DIAGNOSIS — K219 Gastro-esophageal reflux disease without esophagitis: Secondary | ICD-10-CM | POA: Insufficient documentation

## 2014-03-02 DIAGNOSIS — Z8249 Family history of ischemic heart disease and other diseases of the circulatory system: Secondary | ICD-10-CM | POA: Diagnosis not present

## 2014-03-02 DIAGNOSIS — I10 Essential (primary) hypertension: Secondary | ICD-10-CM | POA: Insufficient documentation

## 2014-03-02 DIAGNOSIS — F3289 Other specified depressive episodes: Secondary | ICD-10-CM | POA: Insufficient documentation

## 2014-03-02 DIAGNOSIS — Z09 Encounter for follow-up examination after completed treatment for conditions other than malignant neoplasm: Secondary | ICD-10-CM | POA: Insufficient documentation

## 2014-03-02 DIAGNOSIS — L259 Unspecified contact dermatitis, unspecified cause: Secondary | ICD-10-CM | POA: Insufficient documentation

## 2014-03-02 DIAGNOSIS — Z87891 Personal history of nicotine dependence: Secondary | ICD-10-CM | POA: Diagnosis not present

## 2014-03-02 DIAGNOSIS — Z79899 Other long term (current) drug therapy: Secondary | ICD-10-CM | POA: Diagnosis not present

## 2014-03-02 DIAGNOSIS — F329 Major depressive disorder, single episode, unspecified: Secondary | ICD-10-CM | POA: Diagnosis not present

## 2014-03-02 DIAGNOSIS — Z23 Encounter for immunization: Secondary | ICD-10-CM | POA: Diagnosis not present

## 2014-03-02 LAB — POCT URINALYSIS DIPSTICK
Bilirubin, UA: NEGATIVE
Blood, UA: NEGATIVE
Glucose, UA: NEGATIVE
KETONES UA: NEGATIVE
Leukocytes, UA: NEGATIVE
Nitrite, UA: NEGATIVE
PROTEIN UA: NEGATIVE
SPEC GRAV UA: 1.015
Urobilinogen, UA: 0.2
pH, UA: 7

## 2014-03-02 NOTE — Progress Notes (Signed)
Patient ID: Whitney White, female   DOB: 01/02/1976, 38 y.o.   MRN: [id number]  CC: ED follow up  HPI:  Patient reports that she was seen in the ER a few weeks back for diarrhea.  She reports that she found to have bilirubin in her urine while in the ER.  Patient states that was dehydrated at the time. She states that her urine sample was contaminated with stool when she gave the sample.    No Known Allergies Past Medical History  Diagnosis Date  . Eczema   . Hypertension   . Depression   . Gestational hypertension   . GERD (gastroesophageal reflux disease)    Current Outpatient Prescriptions on File Prior to Visit  Medication Sig Dispense Refill  . amLODipine (NORVASC) 5 MG tablet Take 1 tablet (5 mg total) by mouth daily.  30 tablet  4  . clonazePAM (KLONOPIN) 0.5 MG tablet Take 0.5 mg by mouth 2 (two) times daily as needed for anxiety.      . Flaxseed, Linseed, (FLAX SEED OIL) 1000 MG CAPS Take 1 capsule by mouth daily.      Marland Kitchen. FLUoxetine (PROZAC) 40 MG capsule Take 40 mg by mouth daily.      Marland Kitchen. levonorgestrel (MIRENA) 20 MCG/24HR IUD 1 each by Intrauterine route once.      . Multiple Vitamins-Minerals (MULTIVITAMIN WITH MINERALS) tablet Take 1 tablet by mouth daily.      . Omega-3 Fatty Acids (FISH OIL) 1000 MG CAPS Take 2 capsules by mouth daily.      . pantoprazole (PROTONIX) 40 MG tablet Take 1 tablet (40 mg total) by mouth daily.  30 tablet  4  . Vitamin D, Ergocalciferol, (DRISDOL) 50000 UNITS CAPS capsule Take 50,000 Units by mouth every 7 (seven) days. Friday       No current facility-administered medications on file prior to visit.   Family History  Problem Relation Age of Onset  . Anesthesia problems Neg Hx   . Hypertension Mother   . Stroke Mother   . Diabetes Maternal Grandmother   . Hypertension Maternal Grandmother   . Cancer Maternal Aunt     breast cancer    History   Social History  . Marital Status: Divorced    Spouse Name: N/A    Number of Children: N/A   . Years of Education: N/A   Occupational History  . Not on file.   Social History Main Topics  . Smoking status: Former Games developermoker  . Smokeless tobacco: Never Used  . Alcohol Use: No  . Drug Use: No  . Sexual Activity: Not Currently    Birth Control/ Protection: None, Other-see comments     Comment: merania removed   Other Topics Concern  . Not on file   Social History Narrative  . No narrative on file    Review of Systems: Constitutional: Negative for fever, chills, diaphoresis, activity change, appetite change and fatigue. HENT: Negative for ear pain, nosebleeds, congestion, facial swelling, rhinorrhea, neck pain, neck stiffness and ear discharge.  Eyes: Negative for pain, discharge, redness, itching and visual disturbance. Respiratory: Negative for cough, choking, chest tightness, shortness of breath, wheezing and stridor.  Cardiovascular: Negative for chest pain, palpitations and leg swelling. Gastrointestinal: Negative for abdominal distention. Genitourinary: Negative for dysuria, urgency, frequency, hematuria, flank pain, decreased urine volume, difficulty urinating and dyspareunia.  Musculoskeletal: Negative for back pain, joint swelling, arthralgias and gait problem. Neurological: Negative for dizziness, tremors, seizures, syncope, facial asymmetry, speech difficulty, weakness,  light-headedness, numbness and headaches.  Hematological: Negative for adenopathy. Does not bruise/bleed easily. Psychiatric/Behavioral: Negative for hallucinations, behavioral problems, confusion, dysphoric mood, decreased concentration and agitation.    Objective:   Filed Vitals:   03/02/14 1131  BP: 110/76  Pulse: 71  Temp: 98.5 F (36.9 C)  Resp: 16    Physical Exam: Constitutional: Patient appears well-developed and well-nourished. No distress. CVS: RRR, S1/S2 +, no murmurs, no gallops, no carotid bruit.  Pulmonary: Effort and breath sounds normal, no stridor, rhonchi, wheezes,  rales.  Abdominal: Soft. BS +,  no distension, tenderness, rebound or guarding.  Musculoskeletal: Normal range of motion. No edema and no tenderness.  Neuro: Alert. Normal reflexes, muscle tone coordination. No cranial nerve deficit. Skin: Skin is warm and dry. No rash noted. Not diaphoretic. No erythema. No pallor. Psychiatric: Normal mood and affect. Behavior, judgment, thought content normal.  Lab Results  Component Value Date   WBC 7.9 02/15/2014   HGB 12.1 02/15/2014   HCT 38.5 02/15/2014   MCV 76.7* 02/15/2014   PLT 309 02/15/2014   Lab Results  Component Value Date   CREATININE 0.87 02/15/2014   BUN 9 02/15/2014   NA 140 02/15/2014   K 3.7 02/15/2014   CL 99 02/15/2014   CO2 26 02/15/2014    Lab Results  Component Value Date   HGBA1C 5.8* 01/06/2014   Lipid Panel     Component Value Date/Time   CHOL 183 01/06/2014 1241   TRIG 86 01/06/2014 1241   HDL 41 01/06/2014 1241   CHOLHDL 4.5 01/06/2014 1241   VLDL 17 01/06/2014 1241   LDLCALC 125* 01/06/2014 1241       Assessment and plan:   Jarrett was seen today for follow-up.  Diagnoses and associated orders for this visit:  Preventative health care - Urinalysis Dipstick-negative for bilirubin Reassured patient that it was likely due to contaminated specimen. LFT's are all normal  Need for prophylactic vaccination and inoculation against influenza received   Will recheck urine on next visit for reassurance. She will keep her follow up visit in November   Holland Commons, NP-C [facility] [phone] 03/06/2014, 9:40 PM

## 2014-03-02 NOTE — Progress Notes (Signed)
Pt is here following up after having moderate bilirubin in her urine.

## 2014-03-25 ENCOUNTER — Other Ambulatory Visit: Payer: Self-pay | Admitting: Internal Medicine

## 2014-04-04 ENCOUNTER — Encounter: Payer: Self-pay | Admitting: Internal Medicine

## 2014-04-05 ENCOUNTER — Other Ambulatory Visit: Payer: Self-pay | Admitting: Internal Medicine

## 2014-06-21 ENCOUNTER — Other Ambulatory Visit: Payer: Self-pay | Admitting: Internal Medicine

## 2014-06-23 ENCOUNTER — Telehealth: Payer: Self-pay | Admitting: Internal Medicine

## 2014-06-23 DIAGNOSIS — I10 Essential (primary) hypertension: Secondary | ICD-10-CM

## 2014-06-23 MED ORDER — AMLODIPINE BESYLATE 5 MG PO TABS: 5.0000 mg | ORAL_TABLET | Freq: Every day | ORAL | Status: DC

## 2014-06-23 NOTE — Telephone Encounter (Signed)
Pt has f/u scheduled on 06/30/14 but is out of bp meds and is requesting enough refill until next visit, please f/u with pt.

## 2014-06-23 NOTE — Telephone Encounter (Signed)
[facility] 

## 2014-06-30 ENCOUNTER — Ambulatory Visit: Payer: Medicaid Other | Admitting: Internal Medicine

## 2014-07-07 ENCOUNTER — Ambulatory Visit: Payer: Medicaid Other | Attending: Internal Medicine | Admitting: Internal Medicine

## 2014-07-07 ENCOUNTER — Encounter: Payer: Self-pay | Admitting: Internal Medicine

## 2014-07-07 VITALS — BP 109/74 | HR 78 | Temp 97.8°F | Resp 16 | Ht 62.0 in | Wt 236.0 lb

## 2014-07-07 DIAGNOSIS — E669 Obesity, unspecified: Secondary | ICD-10-CM | POA: Insufficient documentation

## 2014-07-07 DIAGNOSIS — Z79899 Other long term (current) drug therapy: Secondary | ICD-10-CM | POA: Insufficient documentation

## 2014-07-07 DIAGNOSIS — K219 Gastro-esophageal reflux disease without esophagitis: Secondary | ICD-10-CM | POA: Diagnosis not present

## 2014-07-07 DIAGNOSIS — Z975 Presence of (intrauterine) contraceptive device: Secondary | ICD-10-CM | POA: Diagnosis not present

## 2014-07-07 DIAGNOSIS — Z6841 Body Mass Index (BMI) 40.0 and over, adult: Secondary | ICD-10-CM | POA: Diagnosis not present

## 2014-07-07 DIAGNOSIS — I10 Essential (primary) hypertension: Secondary | ICD-10-CM | POA: Diagnosis not present

## 2014-07-07 MED ORDER — AMLODIPINE BESYLATE 5 MG PO TABS: 5.0000 mg | ORAL_TABLET | Freq: Every day | ORAL | Status: DC

## 2014-07-07 MED ORDER — PANTOPRAZOLE SODIUM 40 MG PO TBEC: 40.0000 mg | DELAYED_RELEASE_TABLET | Freq: Every day | ORAL | Status: DC

## 2014-07-07 NOTE — Progress Notes (Signed)
Pt is here following up on her HTN. Pt states that she is compliant with her medication regimen.

## 2014-07-07 NOTE — Progress Notes (Signed)
Patient ID: Whitney BoucheShanni V Cargile, female   DOB: 23-Sep-1975, 39 y.o.   MRN: [id number] Subjective:  Whitney White is a 39 y.o. female with hypertension.  Current Outpatient Prescriptions  Medication Sig Dispense Refill  . amLODipine (NORVASC) 5 MG tablet Take 1 tablet (5 mg total) by mouth daily. 30 tablet 0  . clonazePAM (KLONOPIN) 0.5 MG tablet Take 0.5 mg by mouth 2 (two) times daily as needed for anxiety.    . Flaxseed, Linseed, (FLAX SEED OIL) 1000 MG CAPS Take 1 capsule by mouth daily.    Marland Kitchen. FLUoxetine (PROZAC) 40 MG capsule Take 40 mg by mouth daily.    Marland Kitchen. levonorgestrel (MIRENA) 20 MCG/24HR IUD 1 each by Intrauterine route once.    . Multiple Vitamins-Minerals (MULTIVITAMIN WITH MINERALS) tablet Take 1 tablet by mouth daily.    . Omega-3 Fatty Acids (FISH OIL) 1000 MG CAPS Take 2 capsules by mouth daily.    . pantoprazole (PROTONIX) 40 MG tablet Take 1 tablet (40 mg total) by mouth daily. 30 tablet 4  . Vitamin D, Ergocalciferol, (DRISDOL) 50000 UNITS CAPS capsule Take 50,000 Units by mouth every 7 (seven) days. Friday     No current facility-administered medications for this visit.    Hypertension ROS: taking medications as instructed, no medication side effects noted, no TIA's, no chest pain on exertion, notes stable dyspnea on exertion, no change, no swelling of ankles and no palpitations.  New concerns: Has noticed slight weight gain  Objective:  BP 109/74 mmHg  Pulse 78  Temp(Src) 97.8 F (36.6 C) (Oral)  Resp 16  Ht 5\' 2"  (1.575 m)  Wt 236 lb (107.049 kg)  BMI 43.15 kg/m2  SpO2 99%  LMP 06/21/2014  Appearance alert, well appearing, and in no distress, oriented to person, place, and time and overweight. General exam BP noted to be well controlled today in office, S1, S2 normal, no gallop, no murmur, chest clear, no JVD, no HSM, no edema, CVS exam  - normal rate, regular rhythm, normal S1, S2, no murmurs, rubs, clicks or gallops, normal bilateral carotid upstroke without bruits, no  JVD.  Lab review: labs are reviewed, up to date and normal.   Assessment:   Hypertension well controlled.   Plan:  Current treatment plan is effective, no change in therapy.   Whitney White was seen today for follow-up.  Diagnoses and associated orders for this visit:  Essential hypertension - Continue amLODipine (NORVASC) 5 MG tablet; Take 1 tablet (5 mg total) by mouth daily.  Gastroesophageal reflux disease, esophagitis presence not specified - Refill pantoprazole (PROTONIX) 40 MG tablet; Take 1 tablet (40 mg total) by mouth daily.  Return in about 6 months (around 01/05/2015) for Hypertension.  Holland CommonsKECK, VALERIE, NP 07/07/2014 9:41 PM

## 2014-07-07 NOTE — Patient Instructions (Signed)
DASH Eating Plan °DASH stands for "Dietary Approaches to Stop Hypertension." The DASH eating plan is a healthy eating plan that has been shown to reduce high blood pressure (hypertension). Additional health benefits may include reducing the risk of type 2 diabetes mellitus, heart disease, and stroke. The DASH eating plan may also help with weight loss. °WHAT DO I NEED TO KNOW ABOUT THE DASH EATING PLAN? °For the DASH eating plan, you will follow these general guidelines: °· Choose foods with a percent daily value for sodium of less than 5% (as listed on the food label). °· Use salt-free seasonings or herbs instead of table salt or sea salt. °· Check with your health care provider or pharmacist before using salt substitutes. °· Eat lower-sodium products, often labeled as "lower sodium" or "no salt added." °· Eat fresh foods. °· Eat more vegetables, fruits, and low-fat dairy products. °· Choose whole grains. Look for the word "whole" as the first word in the ingredient list. °· Choose fish and skinless chicken or turkey more often than red meat. Limit fish, poultry, and meat to 6 oz (170 g) each day. °· Limit sweets, desserts, sugars, and sugary drinks. °· Choose heart-healthy fats. °· Limit cheese to 1 oz (28 g) per day. °· Eat more home-cooked food and less restaurant, buffet, and fast food. °· Limit fried foods. °· Cook foods using methods other than frying. °· Limit canned vegetables. If you do use them, rinse them well to decrease the sodium. °· When eating at a restaurant, ask that your food be prepared with less salt, or no salt if possible. °WHAT FOODS CAN I EAT? °Seek help from a dietitian for individual calorie needs. °Grains °Whole grain or whole wheat bread. Brown rice. Whole grain or whole wheat pasta. Quinoa, bulgur, and whole grain cereals. Low-sodium cereals. Corn or whole wheat flour tortillas. Whole grain cornbread. Whole grain crackers. Low-sodium crackers. °Vegetables °Fresh or frozen vegetables  (raw, steamed, roasted, or grilled). Low-sodium or reduced-sodium tomato and vegetable juices. Low-sodium or reduced-sodium tomato sauce and paste. Low-sodium or reduced-sodium canned vegetables.  °Fruits °All fresh, canned (in natural juice), or frozen fruits. °Meat and Other Protein Products °Ground beef (85% or leaner), grass-fed beef, or beef trimmed of fat. Skinless chicken or turkey. Ground chicken or turkey. Pork trimmed of fat. All fish and seafood. Eggs. Dried beans, peas, or lentils. Unsalted nuts and seeds. Unsalted canned beans. °Dairy °Low-fat dairy products, such as skim or 1% milk, 2% or reduced-fat cheeses, low-fat ricotta or cottage cheese, or plain low-fat yogurt. Low-sodium or reduced-sodium cheeses. °Fats and Oils °Tub margarines without trans fats. Light or reduced-fat mayonnaise and salad dressings (reduced sodium). Avocado. Safflower, olive, or canola oils. Natural peanut or almond butter. °Other °Unsalted popcorn and pretzels. °The items listed above may not be a complete list of recommended foods or beverages. Contact your dietitian for more options. °WHAT FOODS ARE NOT RECOMMENDED? °Grains °White bread. White pasta. White rice. Refined cornbread. Bagels and croissants. Crackers that contain trans fat. °Vegetables °Creamed or fried vegetables. Vegetables in a cheese sauce. Regular canned vegetables. Regular canned tomato sauce and paste. Regular tomato and vegetable juices. °Fruits °Dried fruits. Canned fruit in light or heavy syrup. Fruit juice. °Meat and Other Protein Products °Fatty cuts of meat. Ribs, chicken wings, bacon, sausage, bologna, salami, chitterlings, fatback, hot dogs, bratwurst, and packaged luncheon meats. Salted nuts and seeds. Canned beans with salt. °Dairy °Whole or 2% milk, cream, half-and-half, and cream cheese. Whole-fat or sweetened yogurt. Full-fat   cheeses or [place] cheese. Nondairy creamers and whipped toppings. Processed cheese, cheese spreads, or cheese  curds. °Condiments °Onion and garlic salt, seasoned salt, table salt, and sea salt. Canned and packaged gravies. Worcestershire sauce. Tartar sauce. Barbecue sauce. Teriyaki sauce. Soy sauce, including reduced sodium. Steak sauce. Fish sauce. Oyster sauce. Cocktail sauce. Horseradish. Ketchup and mustard. Meat flavorings and tenderizers. Bouillon cubes. Hot sauce. Tabasco sauce. Marinades. Taco seasonings. Relishes. °Fats and Oils °Butter, stick margarine, lard, shortening, ghee, and bacon fat. Coconut, palm kernel, or palm oils. Regular salad dressings. °Other °Pickles and olives. Salted popcorn and pretzels. °The items listed above may not be a complete list of foods and beverages to avoid. Contact your dietitian for more information. °WHERE CAN I FIND MORE INFORMATION? °National Heart, Lung, and Blood Institute: www.nhlbi.nih.gov/health/health-topics/topics/dash/ °Document Released: 05/09/2011 Document Revised: 10/04/2013 Document Reviewed: 03/24/2013 °ExitCare® Patient Information ©2015 [employer]. This information is not intended to replace advice given to you by your health care provider. Make sure you discuss any questions you have with your health care provider. ° °

## 2014-07-20 ENCOUNTER — Other Ambulatory Visit: Payer: Self-pay | Admitting: Internal Medicine

## 2014-07-22 ENCOUNTER — Emergency Department (HOSPITAL_COMMUNITY)
Admission: EM | Admit: 2014-07-22 | Discharge: 2014-07-22 | Disposition: A | Discharge status: Home / Self Care | Payer: Medicaid Other | Attending: Emergency Medicine | Admitting: Emergency Medicine

## 2014-07-22 ENCOUNTER — Encounter (HOSPITAL_COMMUNITY): Payer: Self-pay | Admitting: Emergency Medicine

## 2014-07-22 ENCOUNTER — Emergency Department (HOSPITAL_COMMUNITY): Payer: Medicaid Other

## 2014-07-22 DIAGNOSIS — R05 Cough: Secondary | ICD-10-CM | POA: Diagnosis not present

## 2014-07-22 DIAGNOSIS — K219 Gastro-esophageal reflux disease without esophagitis: Secondary | ICD-10-CM | POA: Insufficient documentation

## 2014-07-22 DIAGNOSIS — R079 Chest pain, unspecified: Secondary | ICD-10-CM | POA: Diagnosis present

## 2014-07-22 DIAGNOSIS — F329 Major depressive disorder, single episode, unspecified: Secondary | ICD-10-CM | POA: Insufficient documentation

## 2014-07-22 DIAGNOSIS — I10 Essential (primary) hypertension: Secondary | ICD-10-CM | POA: Insufficient documentation

## 2014-07-22 DIAGNOSIS — R0789 Other chest pain: Secondary | ICD-10-CM | POA: Insufficient documentation

## 2014-07-22 DIAGNOSIS — Z7982 Long term (current) use of aspirin: Secondary | ICD-10-CM | POA: Insufficient documentation

## 2014-07-22 DIAGNOSIS — Z87891 Personal history of nicotine dependence: Secondary | ICD-10-CM | POA: Diagnosis not present

## 2014-07-22 DIAGNOSIS — Z3202 Encounter for pregnancy test, result negative: Secondary | ICD-10-CM | POA: Diagnosis not present

## 2014-07-22 DIAGNOSIS — Z79899 Other long term (current) drug therapy: Secondary | ICD-10-CM | POA: Diagnosis not present

## 2014-07-22 DIAGNOSIS — Z872 Personal history of diseases of the skin and subcutaneous tissue: Secondary | ICD-10-CM | POA: Diagnosis not present

## 2014-07-22 DIAGNOSIS — M7989 Other specified soft tissue disorders: Secondary | ICD-10-CM | POA: Insufficient documentation

## 2014-07-22 LAB — POC URINE PREG, ED: Preg Test, Ur: NEGATIVE

## 2014-07-22 LAB — BASIC METABOLIC PANEL
Anion gap: 6 (ref 5–15)
BUN: 14 mg/dL (ref 6–23)
CHLORIDE: 107 mmol/L (ref 96–112)
CO2: 26 mmol/L (ref 19–32)
CREATININE: 0.82 mg/dL (ref 0.50–1.10)
Calcium: 9.2 mg/dL (ref 8.4–10.5)
GFR calc Af Amer: >90 mL/min (ref >90)
GFR calc non Af Amer: >90 mL/min (ref >90)
Glucose, Bld: 111 mg/dL — ABNORMAL HIGH (ref 70–99)
POTASSIUM: 3.6 mmol/L (ref 3.5–5.1)
Sodium: 139 mmol/L (ref 135–145)

## 2014-07-22 LAB — CBC
HCT: 39.6 % (ref 36.0–46.0)
HEMOGLOBIN: 12.4 g/dL (ref 12.0–15.0)
MCH: 25 pg — AB (ref 26.0–34.0)
MCHC: 31.3 g/dL (ref 30.0–36.0)
MCV: 79.8 fL (ref 78.0–100.0)
Platelets: 324 10*3/uL (ref 150–400)
RBC: 4.96 MIL/uL (ref 3.87–5.11)
RDW: 14 % (ref 11.5–15.5)
WBC: 9.4 10*3/uL (ref 4.0–10.5)

## 2014-07-22 LAB — I-STAT TROPONIN, ED: Troponin i, poc: 0 ng/mL (ref 0.00–0.08)

## 2014-07-22 LAB — D-DIMER, QUANTITATIVE: D-Dimer, Quant: 0.48 ug/mL-FEU (ref 0.00–0.48)

## 2014-07-22 MED ORDER — KETOROLAC TROMETHAMINE 30 MG/ML IJ SOLN
30.0000 mg | Freq: Once | INTRAMUSCULAR | Status: AC
  Administered 2014-07-22: 30 mg via INTRAVENOUS
  Filled 2014-07-22: qty 1

## 2014-07-22 MED ORDER — HYDROCODONE-ACETAMINOPHEN 5-325 MG PO TABS: 2.0000 | ORAL_TABLET | ORAL | Status: DC | PRN

## 2014-07-22 NOTE — ED Notes (Signed)
Reports pain in the right calf. No redness or swelling. Pt says there used to be a hard bump but not any more. SOB resolved. Labs being collected at bedside. With IV start

## 2014-07-22 NOTE — Discharge Instructions (Signed)
Chest Wall Pain Chest wall pain is pain in or around the bones and muscles of your chest. It may take up to 6 weeks to get better. It may take longer if you must stay physically active in your work and activities.  CAUSES  Chest wall pain may happen on its own. However, it may be caused by:  A viral illness like the flu.  Injury.  Coughing.  Exercise.  Arthritis.  Fibromyalgia.  Shingles. HOME CARE INSTRUCTIONS   Avoid overtiring physical activity. Try not to strain or perform activities that cause pain. This includes any activities using your chest or your abdominal and side muscles, especially if heavy weights are used.  Put ice on the sore area.  Put ice in a plastic bag.  Place a towel between your skin and the bag.  Leave the ice on for 15-20 minutes per hour while awake for the first 2 days.  Only take over-the-counter or prescription medicines for pain, discomfort, or fever as directed by your caregiver. SEEK IMMEDIATE MEDICAL CARE IF:   Your pain increases, or you are very uncomfortable.  You have a fever.  Your chest pain becomes worse.  You have new, unexplained symptoms.  You have nausea or vomiting.  You feel sweaty or lightheaded.  You have a cough with phlegm (sputum), or you cough up blood. MAKE SURE YOU:   Understand these instructions.  Will watch your condition.  Will get help right away if you are not doing well or get worse. Document Released: 05/20/2005 Document Revised: 08/12/2011 Document Reviewed: 01/14/2011 [facility] Patient Information 2015 [place], [place]. This information is not intended to replace advice given to you by your health care provider. Make sure you discuss any questions you have with your health care provider. Tonight your EKG chest x ray cardiac marker and labs are all normal  You have been given a prescriptions for pain control  Please make an appointment with your PCP to be seen in the next week If you develop new or  worsening symptoms please return

## 2014-07-22 NOTE — ED Notes (Signed)
Pt reports that she began having L sided CP with deep inspiration that "feels like I need to burp but can't get it out." Pt states that the pain shoots into her L upper back. Pt reports coughing up bright red blood earlier in the day. Pt also states that she has been having an increase in HAs over the past week. Pt ambulatory without difficulty, skin warm and dry.

## 2014-07-22 NOTE — ED Provider Notes (Signed)
CSN: [id number]     Arrival date & time 07/22/14  0043 History   First MD Initiated Contact with Patient 07/22/14 0148     Chief Complaint  Patient presents with  . Chest Pain     (Consider location/radiation/quality/duration/timing/severity/associated sxs/prior Treatment) HPI Comments: Patient states she's had URI symptoms and cough for the past several days.  She has left chest pain.  That's just below the clavicle on an angle to the sternum, states this is worse when she takes a deep breath.  It radiates to her back.  She also reports that she's had a sore spot on her lateral right thigh for the past several days.  Denies any trauma redness or injury.   Patient is a 39 y.o. female presenting with chest pain. The history is provided by the patient.  Chest Pain Pain location:  L chest Pain quality: aching   Pain radiates to:  Does not radiate Pain radiates to the back: yes   Pain severity:  Mild Onset quality:  Gradual Timing:  Constant Progression:  Unchanged Chronicity:  New Context: at rest   Context: no movement and not raising an arm   Relieved by:  Nothing Worsened by:  Nothing tried Ineffective treatments:  None tried Associated symptoms: anxiety and cough   Associated symptoms: no fever, no lower extremity edema, no numbness and no shortness of breath   Cough:    Cough characteristics:  Non-productive   Severity:  Moderate   Duration:  2 days   Timing:  Constant   Past Medical History  Diagnosis Date  . Eczema   . Hypertension   . Depression   . Gestational hypertension   . GERD (gastroesophageal reflux disease)    Past Surgical History  Procedure Laterality Date  . No past surgeries     Family History  Problem Relation Age of Onset  . Anesthesia problems Neg Hx   . Hypertension Mother   . Stroke Mother   . Diabetes Maternal Grandmother   . Hypertension Maternal Grandmother   . Cancer Maternal Aunt     breast cancer    History  Substance Use Topics   . Smoking status: Former Games developer  . Smokeless tobacco: Never Used  . Alcohol Use: No   OB History    Gravida Para Term Preterm AB TAB SAB Ectopic Multiple Living   0 0 0 0 0 0 4     Review of Systems  Constitutional: Negative for fever.  Respiratory: Positive for cough. Negative for shortness of breath, wheezing and stridor.   Cardiovascular: Positive for chest pain and leg swelling.  Skin: Negative for rash and wound.  Neurological: Negative for numbness.  All other systems reviewed and are negative.     Allergies  Review of patient's allergies indicates no known allergies.  Home Medications   Prior to Admission medications   Medication Sig Start Date End Date Taking? Authorizing Provider  amLODipine (NORVASC) 5 MG tablet Take 1 tablet (5 mg total) by mouth daily. 07/07/14  Yes [name], NP  aspirin 81 MG tablet Take 162 mg by mouth daily.   Yes Historical Provider, MD  clonazePAM (KLONOPIN) 0.5 MG tablet Take 0.5 mg by mouth 2 (two) times daily as needed for anxiety.   Yes Historical Provider, MD  Flaxseed, Linseed, (FLAX SEED OIL) 1000 MG CAPS Take 1 capsule by mouth daily.   Yes Historical Provider, MD  FLUoxetine (PROZAC) 40 MG capsule Take 40 mg by  mouth daily.   Yes Historical Provider, MD  levonorgestrel (MIRENA) 20 MCG/24HR IUD 1 each by Intrauterine route once.   Yes Historical Provider, MD  Multiple Vitamins-Minerals (MULTIVITAMIN WITH MINERALS) tablet Take 1 tablet by mouth daily.   Yes Historical Provider, MD  Omega-3 Fatty Acids (FISH OIL) 1000 MG CAPS Take 2 capsules by mouth daily.   Yes Historical Provider, MD  pantoprazole (PROTONIX) 40 MG tablet Take 1 tablet (40 mg total) by mouth daily. 07/07/14  Yes Ambrose FinlandValerie A Keck, NP  HYDROcodone-acetaminophen (NORCO/VICODIN) 5-325 MG per tablet Take 2 tablets by mouth every 4 (four) hours as needed. 07/22/14   Arman FilterGail K Darwin Rothlisberger, NP   BP 126/77 mmHg  Pulse 82  Temp(Src) 98.5 F (36.9 C) (Oral)  Resp 10  Wt 236 lb  (107.049 kg)  SpO2 99%  LMP 06/21/2014 Physical Exam  Constitutional: She appears well-developed and well-nourished.  HENT:  Head: Normocephalic.  Right Ear: External ear normal.  Left Ear: External ear normal.  Eyes: Pupils are equal, round, and reactive to light.  Neck: Normal range of motion.  Cardiovascular: Normal rate and regular rhythm.   Pulmonary/Chest: Effort normal and breath sounds normal. No respiratory distress. She has no wheezes. She exhibits no tenderness.  Abdominal: Soft.  Musculoskeletal: Normal range of motion. She exhibits tenderness. She exhibits no edema.       Legs: Neurological: She is alert.  Skin: Skin is warm. No erythema.  Vitals reviewed.   ED Course  Procedures (including critical care time) Labs Review Labs Reviewed  CBC - Abnormal; Notable for the following:    MCH 25.0 (*)    All other components within normal limits  BASIC METABOLIC PANEL - Abnormal; Notable for the following:    Glucose, Bld 111 (*)    All other components within normal limits  D-DIMER, QUANTITATIVE  I-STAT TROPOININ, ED  POC URINE PREG, ED    Imaging Review Dg Chest Port 1 View  07/22/2014   CLINICAL DATA:  Left upper chest pain radiating to left shoulder, 24 hour duration. Dyspnea.  EXAM: PORTABLE CHEST - 1 VIEW  COMPARISON:  12/28/2013  FINDINGS: A single AP portable view of the chest demonstrates no focal airspace consolidation or alveolar edema. The lungs are grossly clear. There is no large effusion or pneumothorax. Cardiac and mediastinal contours appear unremarkable.  IMPRESSION: No active disease.   Electronically Signed   By: Ellery Plunkaniel R Mitchell M.D.   On: 07/22/2014 01:51     EKG Interpretation   Date/Time:  Friday July 22 2014 00:59:31 EST Ventricular Rate:  111 PR Interval:  153 QRS Duration: 87 QT Interval:  318 QTC Calculation: 432 R Axis:   67 Text Interpretation:  Sinus tachycardia Probable LVH with secondary repol  abnrm Confirmed by  [facility] CampusALUMBO-RASCH  MD, APRIL ([id number]) on 07/22/2014 1:04:21  AM      MDM   Final diagnoses:  Chest wall discomfort         Arman FilterGail K Damiel Barthold, NP 07/22/14 0401  Arman FilterGail K Jullian Previti, NP 07/22/14 0401  April K Palumbo-Rasch, MD 07/22/14 [id number]

## 2014-07-25 ENCOUNTER — Other Ambulatory Visit: Payer: Self-pay | Admitting: Internal Medicine

## 2014-07-29 ENCOUNTER — Encounter: Payer: Self-pay | Admitting: Internal Medicine

## 2014-07-29 ENCOUNTER — Ambulatory Visit: Payer: Medicaid Other | Attending: Internal Medicine | Admitting: Internal Medicine

## 2014-07-29 VITALS — BP 128/86 | HR 91 | Temp 98.3°F | Resp 16 | Ht 62.0 in | Wt 239.0 lb

## 2014-07-29 DIAGNOSIS — F329 Major depressive disorder, single episode, unspecified: Secondary | ICD-10-CM | POA: Diagnosis not present

## 2014-07-29 DIAGNOSIS — Z79899 Other long term (current) drug therapy: Secondary | ICD-10-CM | POA: Insufficient documentation

## 2014-07-29 DIAGNOSIS — Z87891 Personal history of nicotine dependence: Secondary | ICD-10-CM | POA: Insufficient documentation

## 2014-07-29 DIAGNOSIS — K219 Gastro-esophageal reflux disease without esophagitis: Secondary | ICD-10-CM | POA: Diagnosis not present

## 2014-07-29 DIAGNOSIS — R079 Chest pain, unspecified: Secondary | ICD-10-CM | POA: Insufficient documentation

## 2014-07-29 DIAGNOSIS — F419 Anxiety disorder, unspecified: Secondary | ICD-10-CM | POA: Diagnosis not present

## 2014-07-29 DIAGNOSIS — Z7982 Long term (current) use of aspirin: Secondary | ICD-10-CM | POA: Diagnosis not present

## 2014-07-29 DIAGNOSIS — R0602 Shortness of breath: Secondary | ICD-10-CM

## 2014-07-29 DIAGNOSIS — I10 Essential (primary) hypertension: Secondary | ICD-10-CM | POA: Diagnosis not present

## 2014-07-29 DIAGNOSIS — L309 Dermatitis, unspecified: Secondary | ICD-10-CM | POA: Diagnosis not present

## 2014-07-29 MED ORDER — CLONAZEPAM 0.5 MG PO TABS: 0.5000 mg | ORAL_TABLET | Freq: Two times a day (BID) | ORAL | Status: DC | PRN

## 2014-07-29 NOTE — Progress Notes (Signed)
HFU Pt was in the ED with chest pain and leg pain w/ SOB. Pt states that her legs are hurting. Pt said that she hit the top of her head and she has episodes of double vision.

## 2014-07-29 NOTE — Progress Notes (Signed)
Patient ID: Whitney White, female   DOB: January 15, 1976, 39 y.o.   MRN: [id number]  CC: chest pain HPI: Whitney White is a 39 y.o. female here today for a follow up visit.  Patient has past medical history of HTN, GERD, and depression.  She was recently seen in the ED 7  days ago for chest pain and leg tenderness.  She was found to have chest wall pain and leg tenderness. States that she is still having a deep chest pain that is dull in nature and goes all the way through to her back. Today she c/o that her legs are hurting. The pain is described as a aching sensation. She is concerned about DVT's but had a recent doppler study that was negative. She reports SOB with minimal exertion and hemoptysis,  phlegm with specks of blood.     Feels like her knees are swollen  Pt said that she hit the top of her head on the car door two months ago and she has episodes of double vision. She states that she has had periods of having to go and sit down to gain her composure.   No Known Allergies Past Medical History  Diagnosis Date  . Eczema   . Hypertension   . Depression   . Gestational hypertension   . GERD (gastroesophageal reflux disease)    Current Outpatient Prescriptions on File Prior to Visit  Medication Sig Dispense Refill  . amLODipine (NORVASC) 5 MG tablet Take 1 tablet (5 mg total) by mouth daily. 30 tablet 6  . Multiple Vitamins-Minerals (MULTIVITAMIN WITH MINERALS) tablet Take 1 tablet by mouth daily.    . pantoprazole (PROTONIX) 40 MG tablet Take 1 tablet (40 mg total) by mouth daily. 30 tablet 6  . aspirin 81 MG tablet Take 162 mg by mouth daily.    . clonazePAM (KLONOPIN) 0.5 MG tablet Take 0.5 mg by mouth 2 (two) times daily as needed for anxiety.    . Flaxseed, Linseed, (FLAX SEED OIL) 1000 MG CAPS Take 1 capsule by mouth daily.    Marland Kitchen. FLUoxetine (PROZAC) 40 MG capsule Take 40 mg by mouth daily.    Marland Kitchen. HYDROcodone-acetaminophen (NORCO/VICODIN) 5-325 MG per tablet Take 2 tablets by mouth every 4  (four) hours as needed. (Patient not taking: Reported on 07/29/2014) 10 tablet 0  . levonorgestrel (MIRENA) 20 MCG/24HR IUD 1 each by Intrauterine route once.    . Omega-3 Fatty Acids (FISH OIL) 1000 MG CAPS Take 2 capsules by mouth daily.     No current facility-administered medications on file prior to visit.   Family History  Problem Relation Age of Onset  . Anesthesia problems Neg Hx   . Hypertension Mother   . Stroke Mother   . Diabetes Maternal Grandmother   . Hypertension Maternal Grandmother   . Cancer Maternal Aunt     breast cancer    History   Social History  . Marital Status: Divorced    Spouse Name: N/A  . Number of Children: N/A  . Years of Education: N/A   Occupational History  . Not on file.   Social History Main Topics  . Smoking status: Former Games developermoker  . Smokeless tobacco: Never Used  . Alcohol Use: No  . Drug Use: No  . Sexual Activity: Not Currently    Birth Control/ Protection: None, Other-see comments     Comment: merania removed   Other Topics Concern  . Not on file   Social History Narrative  Review of Systems: See HPI   Objective:   Filed Vitals:   07/29/14 1426  BP: 128/86  Pulse: 91  Temp: 98.3 F (36.8 C)  Resp: 16    Physical Exam  Constitutional: She is oriented to person, place, and time.  Eyes: EOM are normal.  Cardiovascular: Normal rate, regular rhythm and normal heart sounds.   Pulmonary/Chest: Effort normal and breath sounds normal.  Musculoskeletal: She exhibits tenderness (left chest, above breast'). She exhibits no edema.  Neurological: She is alert and oriented to person, place, and time. No cranial nerve deficit.     Lab Results  Component Value Date   WBC 9.4 07/22/2014   HGB 12.4 07/22/2014   HCT 39.6 07/22/2014   MCV 79.8 07/22/2014   PLT 324 07/22/2014   Lab Results  Component Value Date   CREATININE 0.82 07/22/2014   BUN 14 07/22/2014   NA 139 07/22/2014   K 3.6 07/22/2014   CL 107  07/22/2014   CO2 26 07/22/2014    Lab Results  Component Value Date   HGBA1C 5.8* 01/06/2014   Lipid Panel     Component Value Date/Time   CHOL 183 01/06/2014 1241   TRIG 86 01/06/2014 1241   HDL 41 01/06/2014 1241   CHOLHDL 4.5 01/06/2014 1241   VLDL 17 01/06/2014 1241   LDLCALC 125* 01/06/2014 1241       Assessment and plan:   Whitney White was seen today for follow-up.  Diagnoses and all orders for this visit:  SOB (shortness of breath) on exertion Orders: -     CT Angio Chest W/Cm &/Or Wo Cm; Future---Patient insist that she have a CT to r/o out blood clots. I believe that it is highly unlikely  Anxiety Orders: -     clonazePAM (KLONOPIN) 0.5 MG tablet; Take 1 tablet (0.5 mg total) by mouth 3 times/day as needed-between meals & bedtime for anxiety. Explained that I will not refill this medication again. This one time fill will be to hold her until she goes to Psychiatry.   Return for pending results.       Holland Commons, NP-C [facility] [phone] 07/29/2014, 3:07 PM

## 2014-08-02 ENCOUNTER — Ambulatory Visit (HOSPITAL_COMMUNITY)
Admission: RE | Admit: 2014-08-02 | Discharge: 2014-08-02 | Disposition: A | Discharge status: Home / Self Care | Payer: Medicaid Other | Source: Ambulatory Visit | Attending: Internal Medicine | Admitting: Internal Medicine

## 2014-08-02 ENCOUNTER — Encounter (HOSPITAL_COMMUNITY): Payer: Self-pay

## 2014-08-02 DIAGNOSIS — R0602 Shortness of breath: Secondary | ICD-10-CM

## 2014-08-02 DIAGNOSIS — R079 Chest pain, unspecified: Secondary | ICD-10-CM | POA: Diagnosis present

## 2014-08-02 MED ORDER — IOHEXOL 350 MG/ML SOLN
80.0000 mL | Freq: Once | INTRAVENOUS | Status: AC | PRN
  Administered 2014-08-02: 80 mL via INTRAVENOUS

## 2014-08-03 ENCOUNTER — Telehealth: Payer: Self-pay | Admitting: *Deleted

## 2014-08-03 NOTE — Telephone Encounter (Signed)
Attempted to contact patient with negative CT results but phone was busy

## 2014-08-03 NOTE — Telephone Encounter (Signed)
-----   Message from Ambrose FinlandValerie A Keck, NP sent at 08/02/2014 10:28 PM EST ----- CT scan is negative

## 2015-02-26 ENCOUNTER — Other Ambulatory Visit: Payer: Self-pay | Admitting: Internal Medicine

## 2015-02-27 NOTE — Telephone Encounter (Signed)
Patient called for med refill on amLODipine (NORVASC) 5 MG tablet. Please f/u

## 2015-02-27 NOTE — Telephone Encounter (Signed)
Patient called to request a med refill for Amlodipine , please f/u with pt.

## 2015-02-28 NOTE — Telephone Encounter (Signed)
Patient called requesting med refill on amLODipine (NORVASC) 5 MG tablet. Please f/u with pt.

## 2015-02-28 NOTE — Telephone Encounter (Signed)
Please refill.

## 2015-03-01 ENCOUNTER — Telehealth: Payer: Self-pay

## 2015-03-01 NOTE — Telephone Encounter (Signed)
Returned patient phone call @ [phone] Patient not available Left message on voice mail to return our call Prescription for her amlodipine was sent to pharmacy on file yesterday

## 2015-03-29 ENCOUNTER — Other Ambulatory Visit: Payer: Self-pay | Admitting: Internal Medicine

## 2015-03-29 NOTE — Telephone Encounter (Signed)
Nurse called patient, patient verified date of birth. Patient aware of amlodipine sent to pharmacy for a 1 month supply. Patient agrees to se provider before having additional refills.  Nurse transferred patient to front office staff to schedule appointment.

## 2015-04-05 ENCOUNTER — Ambulatory Visit: Payer: Medicaid Other | Attending: Internal Medicine | Admitting: Internal Medicine

## 2015-04-05 ENCOUNTER — Encounter: Payer: Self-pay | Admitting: Internal Medicine

## 2015-04-05 VITALS — BP 126/87 | HR 88 | Temp 99.0°F | Resp 16 | Ht 62.0 in | Wt 222.4 lb

## 2015-04-05 DIAGNOSIS — Z7982 Long term (current) use of aspirin: Secondary | ICD-10-CM | POA: Diagnosis not present

## 2015-04-05 DIAGNOSIS — G5601 Carpal tunnel syndrome, right upper limb: Secondary | ICD-10-CM

## 2015-04-05 DIAGNOSIS — Z79899 Other long term (current) drug therapy: Secondary | ICD-10-CM | POA: Insufficient documentation

## 2015-04-05 DIAGNOSIS — I10 Essential (primary) hypertension: Secondary | ICD-10-CM | POA: Diagnosis not present

## 2015-04-05 DIAGNOSIS — E669 Obesity, unspecified: Secondary | ICD-10-CM | POA: Insufficient documentation

## 2015-04-05 DIAGNOSIS — Z6841 Body Mass Index (BMI) 40.0 and over, adult: Secondary | ICD-10-CM | POA: Diagnosis not present

## 2015-04-05 MED ORDER — TRAMADOL HCL 50 MG PO TABS: 50.0000 mg | ORAL_TABLET | Freq: Three times a day (TID) | ORAL | Status: DC | PRN

## 2015-04-05 MED ORDER — MELOXICAM 15 MG PO TABS: 15.0000 mg | ORAL_TABLET | Freq: Every day | ORAL | Status: DC

## 2015-04-05 MED ORDER — CYCLOBENZAPRINE HCL 10 MG PO TABS: 5.0000 mg | ORAL_TABLET | Freq: Two times a day (BID) | ORAL | Status: DC | PRN

## 2015-04-05 MED ORDER — AMLODIPINE BESYLATE 5 MG PO TABS: 5.0000 mg | ORAL_TABLET | Freq: Every day | ORAL | Status: DC

## 2015-04-05 NOTE — Progress Notes (Signed)
Patient ID: Whitney White, female   DOB: [birth year], 39 y.o.   MRN: [id number] Subjective:  Whitney White is a 39 y.o. female with hypertension. She has a medical history of depression, anxiety, and obesity. 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 The pain is located in the first three fingers and her thumb on the right side. The pain does not radiate but feels numb and like pins and needles sensation. She has tried tramadol and ibuprofen for pain.   Current Outpatient Prescriptions  Medication Sig Dispense Refill  . amLODipine (NORVASC) 5 MG tablet TAKE 1 TABLET BY MOUTH EVERY DAY 30 tablet 0  . pantoprazole (PROTONIX) 40 MG tablet Take 1 tablet (40 mg total) by mouth daily. 30 tablet 6  . aspirin 81 MG tablet Take 162 mg by mouth daily.    . clonazePAM (KLONOPIN) 0.5 MG tablet Take 1 tablet (0.5 mg total) by mouth 3 times/day as needed-between meals & bedtime for anxiety. 30 tablet 0  . Flaxseed, Linseed, (FLAX SEED OIL) 1000 MG CAPS Take 1 capsule by mouth daily.    Marland Kitchen. FLUoxetine (PROZAC) 40 MG capsule Take 40 mg by mouth daily.    Marland Kitchen. HYDROcodone-acetaminophen (NORCO/VICODIN) 5-325 MG per tablet Take 2 tablets by mouth every 4 (four) hours as needed. (Patient not taking: Reported on 07/29/2014) 10 tablet 0  . levonorgestrel (MIRENA) 20 MCG/24HR IUD 1 each by Intrauterine route once.    . Multiple Vitamins-Minerals (MULTIVITAMIN WITH MINERALS) tablet Take 1 tablet by mouth daily.    . Omega-3 Fatty Acids (FISH OIL) 1000 MG CAPS Take 2 capsules by mouth daily.     No current facility-administered medications for this visit.    Hypertension ROS: taking medications as instructed, no medication side effects noted, no TIA's, no chest pain on exertion, no dyspnea on exertion, no swelling of ankles, no orthostatic dizziness or lightheadedness and no palpitations. All other systems are negative.  Objective:  BP  126/87 mmHg  Pulse 88  Temp(Src) 99 F (37.2 C)  Resp 16  Ht 5\' 2"  (1.575 m)  Wt 222 lb 6.4 oz (100.88 kg)  BMI 40.67 kg/m2  SpO2 100%  Appearance alert, well appearing, and in no distress, oriented to person, place, and time and overweight. General exam BP noted to be well controlled today in office, S1, S2 normal, no gallop, no murmur, chest clear, no JVD, no HSM, no edema.  Lab review: labs are reviewed, up to date and normal.   Assessment:   Hypertension well controlled and needs to follow diet more regularly.  Right Carpal Tunnel: will try a trial of meloxicam and Flexeril for pain. If no improvement over next 3-4 weeks she may call back and ortho referral will be placed.   Plan:  Current treatment plan is effective, no change in therapy. Reviewed diet, exercise and weight control. Recommended sodium restriction. Cardiovascular risk and specific lipid/LDL goals reviewed.   Return in about 6 months (around 10/03/2015) for Hypertension.  Ambrose FinlandValerie A Jozette Castrellon, NP 04/05/2015 6:09 PM

## 2015-04-05 NOTE — Progress Notes (Signed)
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

## 2015-04-07 ENCOUNTER — Telehealth: Payer: Self-pay

## 2015-04-07 NOTE — Telephone Encounter (Signed)
Attempted to call patient to let her know her wrist splint came in Patient not available Unable to leave voice mail. No machine with number listed

## 2015-04-12 ENCOUNTER — Other Ambulatory Visit: Payer: Self-pay | Admitting: Internal Medicine

## 2015-04-12 ENCOUNTER — Encounter: Payer: Self-pay | Admitting: Internal Medicine

## 2015-04-19 ENCOUNTER — Telehealth: Payer: Self-pay

## 2015-04-19 NOTE — Telephone Encounter (Signed)
Attempted to contact patient again to let her know we have her wrist splint Patient not available Left message on voice mail to return our call

## 2015-04-29 ENCOUNTER — Other Ambulatory Visit: Payer: Self-pay | Admitting: Internal Medicine

## 2015-07-05 ENCOUNTER — Ambulatory Visit: Payer: Medicaid Other | Admitting: Internal Medicine

## 2015-09-15 ENCOUNTER — Emergency Department (HOSPITAL_COMMUNITY)
Admission: EM | Admit: 2015-09-15 | Discharge: 2015-09-15 | Disposition: A | Discharge status: Home / Self Care | Payer: Medicaid Other | Attending: Emergency Medicine | Admitting: Emergency Medicine

## 2015-09-15 ENCOUNTER — Encounter (HOSPITAL_COMMUNITY): Payer: Self-pay | Admitting: Emergency Medicine

## 2015-09-15 ENCOUNTER — Emergency Department (HOSPITAL_COMMUNITY): Payer: Medicaid Other

## 2015-09-15 DIAGNOSIS — I10 Essential (primary) hypertension: Secondary | ICD-10-CM | POA: Insufficient documentation

## 2015-09-15 DIAGNOSIS — F329 Major depressive disorder, single episode, unspecified: Secondary | ICD-10-CM | POA: Insufficient documentation

## 2015-09-15 DIAGNOSIS — W19XXXA Unspecified fall, initial encounter: Secondary | ICD-10-CM

## 2015-09-15 DIAGNOSIS — S99922A Unspecified injury of left foot, initial encounter: Secondary | ICD-10-CM | POA: Diagnosis present

## 2015-09-15 DIAGNOSIS — Y9389 Activity, other specified: Secondary | ICD-10-CM | POA: Diagnosis not present

## 2015-09-15 DIAGNOSIS — Z87891 Personal history of nicotine dependence: Secondary | ICD-10-CM | POA: Diagnosis not present

## 2015-09-15 DIAGNOSIS — W182XXA Fall in (into) shower or empty bathtub, initial encounter: Secondary | ICD-10-CM | POA: Insufficient documentation

## 2015-09-15 DIAGNOSIS — Y998 Other external cause status: Secondary | ICD-10-CM | POA: Diagnosis not present

## 2015-09-15 DIAGNOSIS — K219 Gastro-esophageal reflux disease without esophagitis: Secondary | ICD-10-CM | POA: Diagnosis not present

## 2015-09-15 DIAGNOSIS — M79675 Pain in left toe(s): Secondary | ICD-10-CM

## 2015-09-15 DIAGNOSIS — Z872 Personal history of diseases of the skin and subcutaneous tissue: Secondary | ICD-10-CM | POA: Diagnosis not present

## 2015-09-15 DIAGNOSIS — Z79899 Other long term (current) drug therapy: Secondary | ICD-10-CM | POA: Insufficient documentation

## 2015-09-15 DIAGNOSIS — Z7982 Long term (current) use of aspirin: Secondary | ICD-10-CM | POA: Diagnosis not present

## 2015-09-15 DIAGNOSIS — Y9289 Other specified places as the place of occurrence of the external cause: Secondary | ICD-10-CM | POA: Diagnosis not present

## 2015-09-15 DIAGNOSIS — Z791 Long term (current) use of non-steroidal anti-inflammatories (NSAID): Secondary | ICD-10-CM | POA: Diagnosis not present

## 2015-09-15 MED ORDER — ACETAMINOPHEN 325 MG PO TABS
650.0000 mg | ORAL_TABLET | Freq: Once | ORAL | Status: AC
  Administered 2015-09-15: 650 mg via ORAL
  Filled 2015-09-15: qty 2

## 2015-09-15 MED ORDER — IBUPROFEN 800 MG PO TABS: 800.0000 mg | ORAL_TABLET | Freq: Three times a day (TID) | ORAL | Status: DC

## 2015-09-15 MED ORDER — ACETAMINOPHEN 500 MG PO TABS: 500.0000 mg | ORAL_TABLET | Freq: Four times a day (QID) | ORAL | Status: DC | PRN

## 2015-09-15 NOTE — ED Notes (Signed)
Pt c/o left foot toe pain after falling while trying to get into shower this morning. Pain worsens when standing or walking on it. Feels 'I may have broken my toe beside my big toe."

## 2015-09-15 NOTE — ED Provider Notes (Signed)
CSN: [id number]     Arrival date & time 09/15/15  1631 History  By signing my name below, I, Whitney White, attest that this documentation has been prepared under the direction and in the presence of Whitney HakeNicole Gates Jividen, PA-C. Electronically Signed: Tanda RockersMargaux White, ED Scribe. 09/15/2015. 5:48 PM.   Chief Complaint  Patient presents with  . Fall   The history is provided by the patient. No language interpreter was used.     HPI Comments: Whitney White is a 40 y.o. female with PMHx HTN, who presents to the Emergency Department complaining of sudden onset, constant, left 2nd toe pain and swelling s/p ground level fall that occurred this morning. Pt states that she slipped while getting in the shower. No head injury or LOC. Pt is unsure what happened during the fall or if she landed onto toe. She has not taken anything for the pain PTA. She is able to ambulate but endorses pain. Endorses associated swelling to left 2nd toe. Denies weakness, numbness, tingling, or any other associated symptoms.   Past Medical History  Diagnosis Date  . Eczema   . Hypertension   . Depression   . Gestational hypertension   . GERD (gastroesophageal reflux disease)    Past Surgical History  Procedure Laterality Date  . No past surgeries     Family History  Problem Relation Age of Onset  . Anesthesia problems Neg Hx   . Hypertension Mother   . Stroke Mother   . Diabetes Maternal Grandmother   . Hypertension Maternal Grandmother   . Cancer Maternal Aunt     breast cancer    Social History  Substance Use Topics  . Smoking status: Former Games developermoker  . Smokeless tobacco: Never Used  . Alcohol Use: No   OB History    Gravida Para Term Preterm AB TAB SAB Ectopic Multiple Living   4 4 4  0 0 0 0 0 0 4     Review of Systems  Musculoskeletal: Positive for joint swelling and arthralgias (2nd left toe).  Neurological: Negative for syncope, weakness and numbness.   Allergies  Review of patient's allergies  indicates no known allergies.  Home Medications   Prior to Admission medications   Medication Sig Start Date End Date Taking? Authorizing Provider  amLODipine (NORVASC) 5 MG tablet Take 1 tablet (5 mg total) by mouth daily. 04/05/15   Ambrose FinlandValerie A Keck, NP  aspirin 81 MG tablet Take 162 mg by mouth daily.    Historical Provider, MD  clonazePAM (KLONOPIN) 0.5 MG tablet Take 1 tablet (0.5 mg total) by mouth 3 times/day as needed-between meals & bedtime for anxiety. 07/29/14   Ambrose FinlandValerie A Keck, NP  cyclobenzaprine (FLEXERIL) 10 MG tablet Take 0.5 tablets (5 mg total) by mouth 2 (two) times daily as needed for muscle spasms. 04/05/15   Ambrose FinlandValerie A Keck, NP  Flaxseed, Linseed, (FLAX SEED OIL) 1000 MG CAPS Take 1 capsule by mouth daily.    Historical Provider, MD  FLUoxetine (PROZAC) 40 MG capsule Take 40 mg by mouth daily.    Historical Provider, MD  HYDROcodone-acetaminophen (NORCO/VICODIN) 5-325 MG per tablet Take 2 tablets by mouth every 4 (four) hours as needed. Patient not taking: Reported on 07/29/2014 07/22/14   Earley FavorGail Schulz, NP  ibuprofen (ADVIL,MOTRIN) 800 MG tablet Take 1 tablet (800 mg total) by mouth 3 (three) times daily. 09/15/15   Barrett HenleNicole Elizabeth Daryle Amis, PA-C  levonorgestrel (MIRENA) 20 MCG/24HR IUD 1 each by Intrauterine route once.    Historical  Provider, MD  meloxicam (MOBIC) 15 MG tablet Take 1 tablet (15 mg total) by mouth daily. 04/05/15   [name], NP  Multiple Vitamins-Minerals (MULTIVITAMIN WITH MINERALS) tablet Take 1 tablet by mouth daily.    Historical Provider, MD  Omega-3 Fatty Acids (FISH OIL) 1000 MG CAPS Take 2 capsules by mouth daily.    Historical Provider, MD  pantoprazole (PROTONIX) 40 MG tablet Take 1 tablet (40 mg total) by mouth daily. 07/07/14   [name], NP  pantoprazole (PROTONIX) 40 MG tablet TAKE 1 TABLET BY MOUTH EVERY DAY 04/12/15   [name], NP  traMADol (ULTRAM) 50 MG tablet Take 1 tablet (50 mg total) by mouth every 8 (eight) hours as needed. 04/05/15    [name], NP   BP 132/96 mmHg  Pulse 84  Temp(Src) 98 F (36.7 C) (Oral)  Resp 16  SpO2 100%  LMP 09/15/2015   Physical Exam  Constitutional: She is oriented to person, place, and time. She appears well-developed and well-nourished.  HENT:  Head: Normocephalic and atraumatic.  Eyes: Conjunctivae and EOM are normal. Right eye exhibits no discharge. Left eye exhibits no discharge. No scleral icterus.  Cardiovascular: Normal rate.   Pulmonary/Chest: Effort normal.  Musculoskeletal: Normal range of motion. She exhibits edema and tenderness.  Mild swelling noted to left 2nd toe. TTP. Full ROM of left ankle, foot, and toes. Sensation grossly intact. Cap refill < 2 seconds. 2+ DP pulses. No obvious deformity noted to left 2nd toe. No abrasion, contusion, or laceration.   Neurological: She is alert and oriented to person, place, and time.  Nursing note and vitals reviewed.   ED Course  Procedures (including critical care time)  DIAGNOSTIC STUDIES: Oxygen Saturation is 100% on RA, normal by my interpretation.    COORDINATION OF CARE: 5:43 PM-Discussed treatment plan which includes DG L 2nd Toe with pt at bedside and pt agreed to plan.   Labs Review Labs Reviewed - No data to display  Imaging Review Dg Toe 2nd Left  09/15/2015  CLINICAL DATA:  Stubbed left second toe, with pain at the mid and proximal toe. Initial encounter. EXAM: LEFT SECOND TOE COMPARISON:  None. FINDINGS: There is no evidence of fracture or dislocation. Visualized joint spaces are grossly preserved. Mild soft tissue swelling is suggested about the second toe. IMPRESSION: No evidence of fracture or dislocation. Electronically Signed   By: Roanna Raider M.D.   On: 09/15/2015 18:41   I have personally reviewed and evaluated these images as part of my medical decision-making.   EKG Interpretation None      MDM   Final diagnoses:  Fall  Pain of toe of left foot   Patient presents status post fall due  to slipping in the shower resulting in pain and swelling to left second toe. Denies head injury or LOC. VSS. Exam revealed mild swelling and tenderness to left second toe, no other signs of injury or trauma, left foot otherwise neurovascularly intact. Patient given Tylenol and ice applied to foot while in the ED. Left second toe x-ray negative. I suspect patient's pain is likely due to contusion associated with recent fall. Plan to discharge patient home Tylenol, rice protocol and discussed symptomatically treatment. Patient given postop shoe in the ED for comfort with ambulation. Advised patient to follow up with her PCP as needed.  I personally performed the services described in this documentation, which was scribed in my presence. The recorded information has been reviewed and  is accurate.       Satira Sark [place], [place] 09/15/15 1907  Leta Baptist, MD 09/17/15 1531

## 2015-09-15 NOTE — Discharge Instructions (Signed)
Take your medication as prescribed. I recommend eating prior taking ibuprofen to prevent GI side effects. Elevate, rest and ice your foot to help with pain and swelling. I also recommend continuing to apply ice to affected area for 15-20 minutes 3-4 times daily to help with pain and swelling. Follow-up with your primary care provider in the next week if your symptoms have not improved. Return to the emergency department if symptoms worsen or new onset of fever, redness, swelling, warmth, numbness, tingling, weakness.

## 2015-09-16 ENCOUNTER — Other Ambulatory Visit: Payer: Self-pay | Admitting: Internal Medicine

## 2015-12-18 ENCOUNTER — Other Ambulatory Visit: Payer: Self-pay | Admitting: Internal Medicine

## 2016-06-27 ENCOUNTER — Emergency Department (HOSPITAL_COMMUNITY)
Admission: EM | Admit: 2016-06-27 | Discharge: 2016-06-27 | Disposition: A | Discharge status: Home / Self Care | Payer: No Typology Code available for payment source | Attending: Emergency Medicine | Admitting: Emergency Medicine

## 2016-06-27 ENCOUNTER — Encounter (HOSPITAL_COMMUNITY): Payer: Self-pay

## 2016-06-27 ENCOUNTER — Emergency Department (HOSPITAL_COMMUNITY): Payer: No Typology Code available for payment source

## 2016-06-27 DIAGNOSIS — S161XXA Strain of muscle, fascia and tendon at neck level, initial encounter: Secondary | ICD-10-CM | POA: Insufficient documentation

## 2016-06-27 DIAGNOSIS — Y939 Activity, unspecified: Secondary | ICD-10-CM | POA: Insufficient documentation

## 2016-06-27 DIAGNOSIS — Y92410 Unspecified street and highway as the place of occurrence of the external cause: Secondary | ICD-10-CM | POA: Diagnosis not present

## 2016-06-27 DIAGNOSIS — Z7982 Long term (current) use of aspirin: Secondary | ICD-10-CM | POA: Diagnosis not present

## 2016-06-27 DIAGNOSIS — Y999 Unspecified external cause status: Secondary | ICD-10-CM | POA: Insufficient documentation

## 2016-06-27 DIAGNOSIS — S199XXA Unspecified injury of neck, initial encounter: Secondary | ICD-10-CM | POA: Diagnosis present

## 2016-06-27 DIAGNOSIS — Z79899 Other long term (current) drug therapy: Secondary | ICD-10-CM | POA: Insufficient documentation

## 2016-06-27 DIAGNOSIS — Z87891 Personal history of nicotine dependence: Secondary | ICD-10-CM | POA: Insufficient documentation

## 2016-06-27 DIAGNOSIS — I10 Essential (primary) hypertension: Secondary | ICD-10-CM | POA: Diagnosis not present

## 2016-06-27 MED ORDER — CYCLOBENZAPRINE HCL 10 MG PO TABS
10.0000 mg | ORAL_TABLET | Freq: Two times a day (BID) | ORAL | 0 refills | Status: DC | PRN
Start: 2016-06-27 — End: 2016-08-19

## 2016-06-27 MED ORDER — NAPROXEN 375 MG PO TABS: 375.0000 mg | ORAL_TABLET | Freq: Two times a day (BID) | ORAL | 0 refills | Status: DC
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
## 2016-06-27 NOTE — Discharge Instructions (Signed)
Your x-ray shows no fracture. This is likely muscular skeletal pain. Use a heating pad. Soak in a warm bath absence all. Use the naproxen twice a day for 7 days. Use Flexeril as needed. Take Tylenol. Avoid Motrin or ibuprofen while using the naproxen. If you continued to have a nosebleed please return to the ED. If you have any headaches or vision changes return the ED. If you notice any bulging her left nostril please return to the ED. Follow-up with her primary care doctor in regards to today's visit.

## 2016-06-27 NOTE — ED Notes (Signed)
Patient d/c'd self care.  F/U and medication reviewed.  Patient verbalized understanding. 

## 2016-08-19 ENCOUNTER — Emergency Department (HOSPITAL_COMMUNITY): Payer: Medicaid Other

## 2016-08-19 ENCOUNTER — Emergency Department (HOSPITAL_COMMUNITY)
Admission: EM | Admit: 2016-08-19 | Discharge: 2016-08-19 | Disposition: A | Discharge status: Home / Self Care | Payer: Medicaid Other | Attending: Emergency Medicine | Admitting: Emergency Medicine

## 2016-08-19 ENCOUNTER — Encounter (HOSPITAL_COMMUNITY): Payer: Self-pay

## 2016-08-19 DIAGNOSIS — N838 Other noninflammatory disorders of ovary, fallopian tube and broad ligament: Secondary | ICD-10-CM

## 2016-08-19 DIAGNOSIS — S3991XA Unspecified injury of abdomen, initial encounter: Secondary | ICD-10-CM | POA: Diagnosis present

## 2016-08-19 DIAGNOSIS — Y999 Unspecified external cause status: Secondary | ICD-10-CM | POA: Diagnosis not present

## 2016-08-19 DIAGNOSIS — Z87891 Personal history of nicotine dependence: Secondary | ICD-10-CM | POA: Insufficient documentation

## 2016-08-19 DIAGNOSIS — S39011A Strain of muscle, fascia and tendon of abdomen, initial encounter: Secondary | ICD-10-CM | POA: Insufficient documentation

## 2016-08-19 DIAGNOSIS — N83201 Unspecified ovarian cyst, right side: Secondary | ICD-10-CM | POA: Diagnosis not present

## 2016-08-19 DIAGNOSIS — Y929 Unspecified place or not applicable: Secondary | ICD-10-CM | POA: Diagnosis not present

## 2016-08-19 DIAGNOSIS — A599 Trichomoniasis, unspecified: Secondary | ICD-10-CM | POA: Diagnosis not present

## 2016-08-19 DIAGNOSIS — Y939 Activity, unspecified: Secondary | ICD-10-CM | POA: Diagnosis not present

## 2016-08-19 DIAGNOSIS — Z79899 Other long term (current) drug therapy: Secondary | ICD-10-CM | POA: Insufficient documentation

## 2016-08-19 DIAGNOSIS — X58XXXA Exposure to other specified factors, initial encounter: Secondary | ICD-10-CM | POA: Insufficient documentation

## 2016-08-19 DIAGNOSIS — I10 Essential (primary) hypertension: Secondary | ICD-10-CM | POA: Insufficient documentation

## 2016-08-19 DIAGNOSIS — T148XXA Other injury of unspecified body region, initial encounter: Secondary | ICD-10-CM

## 2016-08-19 DIAGNOSIS — Z7982 Long term (current) use of aspirin: Secondary | ICD-10-CM | POA: Diagnosis not present

## 2016-08-19 LAB — URINALYSIS, ROUTINE W REFLEX MICROSCOPIC
Bilirubin Urine: NEGATIVE
Glucose, UA: NEGATIVE mg/dL
HGB URINE DIPSTICK: NEGATIVE
Ketones, ur: NEGATIVE mg/dL
Nitrite: NEGATIVE
PROTEIN: NEGATIVE mg/dL
SPECIFIC GRAVITY, URINE: 1.012 (ref 1.005–1.030)
pH: 5 (ref 5.0–8.0)

## 2016-08-19 LAB — I-STAT BETA HCG BLOOD, ED (MC, WL, AP ONLY): I-stat hCG, quantitative: <5 m[IU]/mL (ref <5)

## 2016-08-19 LAB — WET PREP, GENITAL
Sperm: NONE SEEN
YEAST WET PREP: NONE SEEN

## 2016-08-19 MED ORDER — IBUPROFEN 600 MG PO TABS: 600.0000 mg | ORAL_TABLET | Freq: Four times a day (QID) | ORAL | 0 refills | Status: DC | PRN

## 2016-08-19 MED ORDER — LIDOCAINE 5 % EX PTCH: 1.0000 | MEDICATED_PATCH | CUTANEOUS | 0 refills | Status: DC

## 2016-08-19 MED ORDER — METRONIDAZOLE 500 MG PO TABS: 500.0000 mg | ORAL_TABLET | Freq: Two times a day (BID) | ORAL | 0 refills | Status: DC

## 2016-08-19 MED ORDER — METHOCARBAMOL 500 MG PO TABS: 500.0000 mg | ORAL_TABLET | Freq: Two times a day (BID) | ORAL | 0 refills | Status: DC

## 2016-08-19 MED ORDER — KETOROLAC TROMETHAMINE 60 MG/2ML IM SOLN
60.0000 mg | Freq: Once | INTRAMUSCULAR | Status: DC
  Filled 2016-08-19: qty 2

## 2016-08-19 MED ORDER — AMLODIPINE BESYLATE 5 MG PO TABS: 5.0000 mg | ORAL_TABLET | Freq: Every day | ORAL | 0 refills | Status: DC

## 2016-08-19 NOTE — ED Provider Notes (Signed)
[facility]-EMERGENCY DEPT Provider Note   CSN: [id number] Arrival date & time: 08/19/16  0843     History   Chief Complaint Chief Complaint  Patient presents with  . Flank Pain    HPI Whitney White is a 41 y.o. female.  HPI   Whitney White is a 41 y.o. female, with a history of GERD, obesity, and HTN, presenting to the ED with mid-back pain beginning two days ago. Pain is dull, aching, and sore, 6/10, nonradiating. Patient thinks she "slept wrong" Also endorses congestion and productive cough with clear sputum over the past couple weeks, nausea and lower left pelvic pain for the last couple days.   Denies abnormal vaginal discharge, fever/chills, vomiting/diarrhea, shortness of breath, chest pain, or any other complaints. LMP 3/3.   States she is not currently sexually active. Last sexual contact was 1.5 years ago. History of chlamydia about 20 years ago.    Past Medical History:  Diagnosis Date  . Depression   . Eczema   . GERD (gastroesophageal reflux disease)   . Gestational hypertension   . Hypertension     Patient Active Problem List   Diagnosis Date Noted  . Anxiety state, unspecified 12/22/2013  . Obesity, unspecified 12/22/2013  . Chest pain 12/17/2013  . Depression   . Hypertension   . Eczema   . Gestational hypertension   . GERD (gastroesophageal reflux disease)     Past Surgical History:  Procedure Laterality Date  . NO PAST SURGERIES      OB History    Gravida Para Term Preterm AB Living   4 4 4  0 0 4   SAB TAB Ectopic Multiple Live Births   0 0 0 0 1       Home Medications    Prior to Admission medications   Medication Sig Start Date End Date Taking? Authorizing Provider  amLODipine (NORVASC) 5 MG tablet Take 1 tablet (5 mg total) by mouth daily. Must have office visit for refills 12/18/15  Yes [name], MD  aspirin 81 MG tablet Take 243 mg by mouth once as needed for pain.    Yes Historical Provider, MD  clonazePAM (KLONOPIN) 0.5  MG tablet Take 1 tablet (0.5 mg total) by mouth 3 times/day as needed-between meals & bedtime for anxiety. 07/29/14  Yes [name], NP  levonorgestrel (MIRENA) 20 MCG/24HR IUD 1 each by Intrauterine route once.   Yes Historical Provider, MD  sodium chloride (OCEAN) 0.65 % SOLN nasal spray Place 1 spray into both nostrils as needed for congestion.   Yes Historical Provider, MD  amLODipine (NORVASC) 5 MG tablet Take 1 tablet (5 mg total) by mouth daily. 08/19/16   Lillan Mccreadie C Chancelor Hardrick, PA-C  ibuprofen (ADVIL,MOTRIN) 600 MG tablet Take 1 tablet (600 mg total) by mouth every 6 (six) hours as needed. 08/19/16   Drae Mitzel C Xadrian Craighead, PA-C  lidocaine (LIDODERM) 5 % Place 1 patch onto the skin daily. Remove & Discard patch within 12 hours or as directed by MD 08/19/16   Anselm Pancoast, PA-C  methocarbamol (ROBAXIN) 500 MG tablet Take 1 tablet (500 mg total) by mouth 2 (two) times daily. 08/19/16   Lauraann Missey C Paige Monarrez, PA-C  metroNIDAZOLE (FLAGYL) 500 MG tablet Take 1 tablet (500 mg total) by mouth 2 (two) times daily. 08/19/16   Christine Morton C Sheyli Horwitz, PA-C  naproxen (NAPROSYN) 375 MG tablet Take 1 tablet (375 mg total) by mouth 2 (two) times daily. Patient not taking: Reported on 08/19/2016  06/27/16   Rise Mu, PA-C    Family History Family History  Problem Relation Age of Onset  . Hypertension Mother   . Stroke Mother   . Diabetes Maternal Grandmother   . Hypertension Maternal Grandmother   . Cancer Maternal Aunt     breast cancer   . Anesthesia problems Neg Hx     Social History Social History  Substance Use Topics  . Smoking status: Former Games developer  . Smokeless tobacco: Never Used  . Alcohol use No     Allergies   Patient has no known allergies.   Review of Systems Review of Systems  Constitutional: Negative for chills, diaphoresis and fever.  Respiratory: Positive for cough. Negative for shortness of breath.   Cardiovascular: Negative for chest pain.  Gastrointestinal: Positive for nausea. Negative for  constipation, diarrhea and vomiting.  Genitourinary: Positive for dysuria and pelvic pain. Negative for vaginal bleeding and vaginal discharge.  Musculoskeletal: Positive for back pain.  Neurological: Negative for syncope, weakness and numbness.  All other systems reviewed and are negative.    Physical Exam Updated Vital Signs BP (!) 134/96 (BP Location: Right Arm)   Pulse 91   Temp 98.2 F (36.8 C) (Other (Comment))   Ht 5\' 2"  (1.575 m)   Wt 108.9 kg   LMP 08/03/2016   SpO2 99%   BMI 43.90 kg/m   Physical Exam  Constitutional: She appears well-developed and well-nourished. No distress.  HENT:  Head: Normocephalic and atraumatic.  Mouth/Throat: Oropharynx is clear and moist.  Eyes: Conjunctivae are normal.  Neck: Neck supple.  Cardiovascular: Normal rate, regular rhythm, normal heart sounds and intact distal pulses.   Pulmonary/Chest: Effort normal and breath sounds normal. No respiratory distress.  Abdominal: Soft. There is tenderness in the left lower quadrant. There is no guarding.  Genitourinary:  Genitourinary Comments: External genitalia normal Vagina with discharge - moderate, thick white-yellow discharge in vaginal vault Cervix  normal negative for cervical motion tenderness Adnexa palpated, no masses noted, but positive for tenderness on the left Bladder palpated, negative for tenderness Uterus palpated, no masses, negative for tenderness  Exam complicated by patient's morbid obesity. Otherwise normal female genitalia. Med Tech, Edgardo Roys, served as chaperone during exam.  Musculoskeletal: She exhibits tenderness. She exhibits no edema.  Left mid-back pain around ribs 5-7 without noted crepitus, deformity, mass, swelling, or erythema. Normal motor function intact in all extremities and spine. No midline spinal tenderness.   Lymphadenopathy:    She has no cervical adenopathy.  Neurological: She is alert.  No sensory deficits. Strength 5/5 in all extremities. No  gait disturbance. Coordination intact.  Skin: Skin is warm and dry. She is not diaphoretic.  Psychiatric: She has a normal mood and affect. Her behavior is normal.  Nursing note and vitals reviewed.    ED Treatments / Results  Labs (all labs ordered are listed, but only abnormal results are displayed) Labs Reviewed  WET PREP, GENITAL - Abnormal; Notable for the following:       Result Value   Trich, Wet Prep PRESENT (*)    Clue Cells Wet Prep HPF POC PRESENT (*)    WBC, Wet Prep HPF POC MANY (*)    All other components within normal limits  URINALYSIS, ROUTINE W REFLEX MICROSCOPIC - Abnormal; Notable for the following:    Color, Urine STRAW (*)    APPearance HAZY (*)    Leukocytes, UA LARGE (*)    Bacteria, UA RARE (*)    Squamous  Epithelial / LPF 6-30 (*)    All other components within normal limits  URINE CULTURE  RPR  HIV ANTIBODY (ROUTINE TESTING)  I-STAT BETA HCG BLOOD, ED ([facility], [facility], [facility] ONLY)  GC/CHLAMYDIA PROBE AMP ([place]) NOT AT [facility]    EKG  EKG Interpretation None       Radiology Dg Chest 2 View  Result Date: 08/19/2016 CLINICAL DATA:  Productive cough. EXAM: CHEST  2 VIEW COMPARISON:  Radiographs of July 22, 2014. FINDINGS: The heart size and mediastinal contours are within normal limits. Both lungs are clear. No pneumothorax or pleural effusion is noted. The visualized skeletal structures are unremarkable. IMPRESSION: No active cardiopulmonary disease. Electronically Signed   By: Lupita RaiderJames  Green Jr, M.D.   On: 08/19/2016 12:18   [place] Transvaginal Non-ob  Result Date: 08/19/2016 CLINICAL DATA:  Left adnexal tenderness for 1.5 weeks. EXAM: TRANSABDOMINAL AND TRANSVAGINAL ULTRASOUND OF PELVIS TECHNIQUE: Both transabdominal and transvaginal ultrasound examinations of the pelvis were performed. Transabdominal technique was performed for global imaging of the pelvis including uterus, ovaries, adnexal regions, and pelvic cul-de-sac. It was necessary to proceed with  endovaginal exam following the transabdominal exam to visualize the adnexa. COMPARISON:  Pelvic ultrasound 09/22/2011 FINDINGS: Uterus Measurements: 9.9 x 4.8 x 6.0 cm, within normal limits. No fibroids or other mass visualized. Endometrium Thickness: 7.0 mm.  A shadowing IUD is in place. Right ovary Measurements: 4.1 x 3.4 x 3.2 cm. A complex right adnexal lesion measures 3.0 x 2.6 x 2.7 cm. This likely represents a complex or involuting cyst. No other focal lesions are present. Left ovary Measurements: 3.7 x 2.0 x 2.7 cm, within normal limits. Normal appearance/no adnexal mass. Other findings A small amount of free fluid is present. IMPRESSION: 1. Complex adnexal lesion on the right without internal blood flow likely represents a complex cyst measuring 3.0 x 2.6 x 2.7 cm. While this likely represents a hemorrhagic cyst, the differential diagnosis includes endometriosis and cystic ovarian neoplasm. Recommend, follow-up ultrasound at 6-8 weeks, during the week, immediately following the patient's normal menses. 2. IUD 3. A small amount of free fluid is likely physiologic. 4. Otherwise unremarkable ultrasound. Electronically Signed   By: Marin Robertshristopher  Mattern M.D.   On: 08/19/2016 14:57   [place] Pelvis Complete  Result Date: 08/19/2016 CLINICAL DATA:  Left adnexal tenderness for 1.5 weeks. EXAM: TRANSABDOMINAL AND TRANSVAGINAL ULTRASOUND OF PELVIS TECHNIQUE: Both transabdominal and transvaginal ultrasound examinations of the pelvis were performed. Transabdominal technique was performed for global imaging of the pelvis including uterus, ovaries, adnexal regions, and pelvic cul-de-sac. It was necessary to proceed with endovaginal exam following the transabdominal exam to visualize the adnexa. COMPARISON:  Pelvic ultrasound 09/22/2011 FINDINGS: Uterus Measurements: 9.9 x 4.8 x 6.0 cm, within normal limits. No fibroids or other mass visualized. Endometrium Thickness: 7.0 mm.  A shadowing IUD is in place. Right ovary  Measurements: 4.1 x 3.4 x 3.2 cm. A complex right adnexal lesion measures 3.0 x 2.6 x 2.7 cm. This likely represents a complex or involuting cyst. No other focal lesions are present. Left ovary Measurements: 3.7 x 2.0 x 2.7 cm, within normal limits. Normal appearance/no adnexal mass. Other findings A small amount of free fluid is present. IMPRESSION: 1. Complex adnexal lesion on the right without internal blood flow likely represents a complex cyst measuring 3.0 x 2.6 x 2.7 cm. While this likely represents a hemorrhagic cyst, the differential diagnosis includes endometriosis and cystic ovarian neoplasm. Recommend, follow-up ultrasound at 6-8 weeks, during the week, immediately  following the patient's normal menses. 2. IUD 3. A small amount of free fluid is likely physiologic. 4. Otherwise unremarkable ultrasound. Electronically Signed   By: Marin Roberts M.D.   On: 08/19/2016 14:57    Procedures Pelvic exam Date/Time: 08/19/2016 10:57 AM Performed by: Anselm Pancoast Authorized by: Harolyn Rutherford C  Consent: Verbal consent obtained. Risks and benefits: risks, benefits and alternatives were discussed Consent given by: patient Patient identity confirmed: verbally with patient and arm band Local anesthesia used: no  Anesthesia: Local anesthesia used: no  Sedation: Patient sedated: no Patient tolerance: Patient tolerated the procedure well with no immediate complications    (including critical care time)  Medications Ordered in ED Medications  ketorolac (TORADOL) injection 60 mg (60 mg Intramuscular Not Given 08/19/16 1251)     Initial Impression / Assessment and Plan / ED Course  I have reviewed the triage vital signs and the nursing notes.  Pertinent labs & imaging results that were available during my care of the patient were reviewed by me and considered in my medical decision making (see chart for details).      Patient presents with what appears to be 2-3 separate complaints.  She has mid left-sided back pain that I suspect is muscular in nature. She also complains of lower abdominal/pelvic pain. No definite UTI on UA. Ovarian mass/cyst noted on ultrasound. Imaging and lab results discussed with the patient. OB/GYN follow-up and repeat ultrasound also discussed. The patient was given instructions for home care as well as return precautions. Patient voices understanding of these instructions, accepts the plan, and is comfortable with discharge.   Vitals:   08/19/16 0859 08/19/16 1247 08/19/16 1604  BP: (!) 134/96 117/81 121/73  Pulse: 91 85 81  Resp:  16 16  Temp: 98.2 F (36.8 C)    TempSrc: Other (Comment)    SpO2: 99% 99%   Weight: 108.9 kg    Height: 5\' 2"  (1.575 m)       Final Clinical Impressions(s) / ED Diagnoses   Final diagnoses:  Trichomonas infection  Muscle strain  Ovarian mass, right    New Prescriptions Discharge Medication List as of 08/19/2016  3:37 PM    START taking these medications   Details  ibuprofen (ADVIL,MOTRIN) 600 MG tablet Take 1 tablet (600 mg total) by mouth every 6 (six) hours as needed., Starting Mon 08/19/2016, Print    lidocaine (LIDODERM) 5 % Place 1 patch onto the skin daily. Remove & Discard patch within 12 hours or as directed by MD, Starting Mon 08/19/2016, Print    methocarbamol (ROBAXIN) 500 MG tablet Take 1 tablet (500 mg total) by mouth 2 (two) times daily., Starting Mon 08/19/2016, Print    metroNIDAZOLE (FLAGYL) 500 MG tablet Take 1 tablet (500 mg total) by mouth 2 (two) times daily., Starting Mon 08/19/2016, Print         Anselm Pancoast, PA-C 08/19/16 1641    Rolland Porter, MD 08/21/16 1710

## 2016-08-19 NOTE — Discharge Instructions (Signed)
Back pain Expect your soreness to increase over the next 2-3 days. Take it easy, but do not lay around too much as this may make any stiffness worse.  Antiinflammatory medications: Take 500 mg of naproxen every 12 hours or 600 mg of ibuprofen every 6 hours for the next 3 days. Take these medications with food to avoid upset stomach. Choose only one of these medications, do not take them together.  Muscle relaxer: Robaxin is a muscle relaxer and may help loosen stiff muscles. Do not take the Robaxin while driving or performing other dangerous activities.   Lidocaine patches: These are available via either prescription or over-the-counter. The over-the-counter option may be more economical one and are likely just as effective. There are multiple over-the-counter brands, such as Salonpas.  Exercises: Be sure to perform the attached exercises starting with three times a week and working up to performing them daily. This is an essential part of preventing long term problems.   Follow up with a primary care provider for any future management of these complaints.  Trichomonas and bacterial vaginosis  You will be placed on an antibiotic called Flagyl (metronidazole). Please take all of your antibiotics until finished!   You may develop abdominal discomfort or diarrhea from the antibiotic.  You may help offset this with probiotics which you can buy or get in yogurt. Do not eat or take the probiotics until 2 hours after your antibiotic.   Ovarian cyst/mass Make contact with OBGYN on this matter. Call the number provided to set up an appointment. You should get a repeat pelvic and transvaginal ultrasound in about 6 weeks to reevaluate this issue. You will primarily be responsible for making sure you get this reassessment performed.

## 2016-08-19 NOTE — ED Triage Notes (Signed)
Pt with left flank pain and pelvic pain.  Pain with urination.  Pt took home UTI test and it was positive.  No fever.

## 2016-08-19 NOTE — ED Notes (Signed)
EDPA Provider at bedside. 

## 2016-08-19 NOTE — ED Notes (Signed)
PELVIC CART AT BEDSIDE. PT READY

## 2016-08-20 LAB — GC/CHLAMYDIA PROBE AMP (~~LOC~~) NOT AT ARMC
Chlamydia: NEGATIVE
Neisseria Gonorrhea: NEGATIVE

## 2016-08-20 LAB — HIV ANTIBODY (ROUTINE TESTING W REFLEX): HIV SCREEN 4TH GENERATION: NONREACTIVE

## 2016-08-20 LAB — URINE CULTURE

## 2016-08-20 LAB — RPR: RPR: NONREACTIVE

## 2016-08-23 ENCOUNTER — Emergency Department (HOSPITAL_COMMUNITY)
Admission: EM | Admit: 2016-08-23 | Discharge: 2016-08-24 | Disposition: A | Discharge status: Home / Self Care | Payer: Medicaid Other | Attending: Emergency Medicine | Admitting: Emergency Medicine

## 2016-08-23 ENCOUNTER — Emergency Department (HOSPITAL_COMMUNITY): Payer: Medicaid Other

## 2016-08-23 ENCOUNTER — Encounter (HOSPITAL_COMMUNITY): Payer: Self-pay

## 2016-08-23 DIAGNOSIS — F419 Anxiety disorder, unspecified: Secondary | ICD-10-CM | POA: Diagnosis present

## 2016-08-23 DIAGNOSIS — R002 Palpitations: Secondary | ICD-10-CM

## 2016-08-23 DIAGNOSIS — Z79899 Other long term (current) drug therapy: Secondary | ICD-10-CM | POA: Diagnosis not present

## 2016-08-23 DIAGNOSIS — R202 Paresthesia of skin: Secondary | ICD-10-CM

## 2016-08-23 DIAGNOSIS — I10 Essential (primary) hypertension: Secondary | ICD-10-CM | POA: Insufficient documentation

## 2016-08-23 DIAGNOSIS — Z87891 Personal history of nicotine dependence: Secondary | ICD-10-CM | POA: Diagnosis not present

## 2016-08-23 NOTE — ED Provider Notes (Signed)
[facility]-EMERGENCY DEPT Provider Note   CSN: [id number] Arrival date & time: 08/23/16  2231  By signing my name below, I, Rosario AdieWilliam Andrew Hiatt, attest that this documentation has been prepared under the direction and in the presence of TRW AutomotiveKelly Awab Abebe, PA-C.  Electronically Signed: Rosario AdieWilliam Andrew Hiatt, ED Scribe. 08/23/16. 11:39 PM.  History   Chief Complaint Chief Complaint  Patient presents with  . Anxiety   The history is provided by the patient. No language interpreter was used.    HPI Comments: Whitney White is a 41 y.o. female BIB EMS, with a PMHx of depression, anxiety, GERD, HTN, and obesity, who presents to the Emergency Department complaining of sudden onset, intermittent episodes of anxiety beginning earlier in the day. Per pt, she was asymptomatic and otherwise normal throughout the day; however, this afternoon she had an acute onset of bilateral frontal "squeezing" headache, sensation of palpations, subjective left hand and left foot numbness and shortness of breath. She also notes that secondary to this that her left arm and left leg feels subjectively weak as well. Each episode of her symptoms lasts for several minutes each time, and she states that she will experience several episodes an hour. She has previously had anxiety attacks in the past, but she is unsure if her symptoms are related to this. She also reoprts that she has not been taking her prescribed Clonazepam for the past three months, however, she has been compliant with her Amlodipine. Per pt, her home life is somewhat stressful d/t her children, family being several states away, and being a single mother. She denies syncope, or any other associated symptoms.    Past Medical History:  Diagnosis Date  . Depression   . Eczema   . GERD (gastroesophageal reflux disease)   . Gestational hypertension   . Hypertension    Patient Active Problem List   Diagnosis Date Noted  . Anxiety state, unspecified 12/22/2013  .  Obesity, unspecified 12/22/2013  . Chest pain 12/17/2013  . Depression   . Hypertension   . Eczema   . Gestational hypertension   . GERD (gastroesophageal reflux disease)    Past Surgical History:  Procedure Laterality Date  . NO PAST SURGERIES     OB History    Gravida Para Term Preterm AB Living   4 4 4  0 0 4   SAB TAB Ectopic Multiple Live Births   0 0 0 0 1     Home Medications    Prior to Admission medications   Medication Sig Start Date End Date Taking? Authorizing Provider  amLODipine (NORVASC) 5 MG tablet Take 1 tablet (5 mg total) by mouth daily. Must have office visit for refills Patient taking differently: Take 5 mg by mouth every morning. Must have office visit for refills 12/18/15  Yes Quentin Angstlugbemiga E Jegede, MD  levonorgestrel (MIRENA) 20 MCG/24HR IUD 1 each by Intrauterine route once.   Yes Historical Provider, MD  metroNIDAZOLE (FLAGYL) 500 MG tablet Take 1 tablet (500 mg total) by mouth 2 (two) times daily. 08/19/16  Yes Shawn C Joy, PA-C  sodium chloride (OCEAN) 0.65 % SOLN nasal spray Place 1 spray into both nostrils as needed for congestion.   Yes Historical Provider, MD  clonazePAM (KLONOPIN) 0.5 MG tablet Take 1 tablet (0.5 mg total) by mouth 3 times/day as needed-between meals & bedtime for anxiety. Patient not taking: Reported White 08/23/2016 07/29/14   Ambrose FinlandValerie A Keck, NP  ibuprofen (ADVIL,MOTRIN) 600 MG tablet Take 1 tablet (600 mg  total) by mouth every 6 (six) hours as needed. Patient not taking: Reported White 08/23/2016 08/19/16   Shawn C Joy, PA-C  lidocaine (LIDODERM) 5 % Place 1 patch onto the skin daily. Remove & Discard patch within 12 hours or as directed by MD Patient not taking: Reported White 08/23/2016 08/19/16   Shawn C Joy, PA-C  methocarbamol (ROBAXIN) 500 MG tablet Take 1 tablet (500 mg total) by mouth 2 (two) times daily. Patient not taking: Reported White 08/23/2016 08/19/16   Shawn C Joy, PA-C  naproxen (NAPROSYN) 375 MG tablet Take 1 tablet (375 mg total) by  mouth 2 (two) times daily. Patient not taking: Reported White 08/19/2016 06/27/16   Rise Mu, PA-C   Family History Family History  Problem Relation Age of Onset  . Hypertension Mother   . Stroke Mother   . Diabetes Maternal Grandmother   . Hypertension Maternal Grandmother   . Cancer Maternal Aunt     breast cancer   . Anesthesia problems Neg Hx    Social History Social History  Substance Use Topics  . Smoking status: Former Games developer  . Smokeless tobacco: Never Used  . Alcohol use No   Allergies   Patient has no known allergies.  Review of Systems Review of Systems  Respiratory: Positive for shortness of breath.   Cardiovascular: Positive for palpitations.  Neurological: Positive for weakness, numbness and headaches. Negative for syncope.  Psychiatric/Behavioral: The patient is nervous/anxious.   A complete 10 system review of systems was obtained and all systems are negative except as noted in the HPI and PMH.    Physical Exam Updated Vital Signs BP 130/84 (BP Location: Right Arm)   Pulse 100   Temp 98.1 F (36.7 C) (Oral)   Resp 17   Ht 5\' 3"  (1.6 m)   Wt 108.9 kg   LMP 08/03/2016   SpO2 100%   BMI 42.51 kg/m   Physical Exam  Constitutional: She is oriented to person, place, and time. She appears well-developed and well-nourished. No distress.  Nontoxic and in NAD  HENT:  Head: Normocephalic and atraumatic.  Mouth/Throat: Oropharynx is clear and moist.  Symmetric rise of the uvula with phonation.  Eyes: Conjunctivae and EOM are normal. Pupils are equal, round, and reactive to light. No scleral icterus.  EOMs normal. No nystagmus.  Neck: Normal range of motion.  No nuchal rigidity or meningismus  Cardiovascular: Normal rate, regular rhythm and intact distal pulses.   Pulmonary/Chest: Effort normal. No respiratory distress. She has no wheezes. She has no rales.  Respirations even and unlabored  Musculoskeletal: Normal range of motion.  Neurological:  She is alert and oriented to person, place, and time.  GCS 15. Speech is goal oriented. No cranial nerve deficits appreciated; symmetric eyebrow raise, no facial drooping, tongue midline. Patient has equal grip strength bilaterally. 4/5 strength in the LUE and LLE, appreciated to be secondary to poor effort; otherwise, strength normal at 5/5. Sensation to light touch intact.  Skin: Skin is warm and dry. No rash noted. She is not diaphoretic. No erythema. No pallor.  Psychiatric: She has a normal mood and affect. Her behavior is normal.  Nursing note and vitals reviewed.   ED Treatments / Results  DIAGNOSTIC STUDIES: Oxygen Saturation is 100% White RA, normal by my interpretation.   COORDINATION OF CARE: 11:38 PM-Discussed next steps with pt. Pt verbalized understanding and is agreeable with the plan.   Labs (all labs ordered are listed, but only abnormal results are  displayed) Labs Reviewed  CBC WITH DIFFERENTIAL/PLATELET - Abnormal; Notable for the following:       Result Value   MCV 76.7 (*)    MCH 24.1 (*)    All other components within normal limits  RAPID URINE DRUG SCREEN, HOSP PERFORMED - Abnormal; Notable for the following:    Tetrahydrocannabinol POSITIVE (*)    All other components within normal limits  CBG MONITORING, ED - Abnormal; Notable for the following:    Glucose-Capillary 127 (*)    All other components within normal limits  COMPREHENSIVE METABOLIC PANEL    EKG  EKG Interpretation  Date/Time:  Saturday August 24 2016 00:35:18 EDT Ventricular Rate:  80 PR Interval:    QRS Duration: 92 QT Interval:  366 QTC Calculation: 423 R Axis:   63 Text Interpretation:  Sinus rhythm Probable left ventricular hypertrophy Confirmed by [facility]  MD, APRIL ([id number]) White 08/24/2016 12:39:48 AM      Radiology Ct Head Wo Contrast  Result Date: 08/24/2016 CLINICAL DATA:  41 year old female with left-sided weakness. EXAM: CT HEAD WITHOUT CONTRAST TECHNIQUE: Contiguous axial  images were obtained from the base of the skull through the vertex without intravenous contrast. COMPARISON:  None. FINDINGS: Brain: No evidence of acute infarction, hemorrhage, hydrocephalus, extra-axial collection or mass lesion/mass effect. Vascular: No hyperdense vessel or unexpected calcification. Skull: Normal. Negative for fracture or focal lesion. Sinuses/Orbits: No acute finding. Other: None. IMPRESSION: Normal unenhanced CT of the brain. Electronically Signed   By: Elgie Collard M.D.   White: 08/24/2016 00:21    Procedures Procedures   Medications Ordered in ED Medications - No data to display  Initial Impression / Assessment and Plan / ED Course  I have reviewed the triage vital signs and the nursing notes.  Pertinent labs & imaging results that were available during my care of the patient were reviewed by me and considered in my medical decision making (see chart for details).     41 year old female presents to the emergency department for evaluation of palpitations and subjective paresthesias in her left upper extremity. Patient also with complaints of headache. She has been out of her clonazepam for the past 3 months.  Neurologic exam is nonfocal today. No history of head injury or trauma. CT was performed given history of paresthesias which is negative for CVA. No hemorrhage, hydrocephalus, mass, or midline shift. Laboratory workup was obtained which is reassuring. EKG without signs of acute ischemia. Patient is positive for marijuana.  Suspect symptoms to be secondary to anxiety, likely due to not have her Klonopin. The patient has been encouraged to continue taking her blood pressure medication. Will refer to primary care for follow-up. Return precautions discussed and provided. Patient discharged in stable condition with no unaddressed concerns.   Vitals:   08/23/16 2243 08/23/16 2244 08/23/16 2301 08/24/16 0100  BP: (!) 155/93  130/84 (!) 154/110  Pulse: (!) 109  100 84    Resp: 20  17 (!) 24  Temp: 98.1 F (36.7 C)     TempSrc: Oral     SpO2: 100%  100% 100%  Weight:  108.9 kg    Height:  5\' 3"  (1.6 m)      Final Clinical Impressions(s) / ED Diagnoses   Final diagnoses:  Palpitations  Paresthesias in left hand   New Prescriptions New Prescriptions   No medications White file    I personally performed the services described in this documentation, which was scribed in my presence. The recorded information has  been reviewed and is accurate.       Antony Madura, PA-C 09/01/16 1610    April Palumbo, MD 09/07/16 [phone]

## 2016-08-23 NOTE — ED Triage Notes (Signed)
Pt brought in by EMS for c/o left side numbness and tingling with headache, dizziness, and light headedness  Pt has hx of anxiety and is out of her medications  Pt states her mother passed away from a stroke  Onset of symptoms was about 30 minutes ago  No neuro deficits noted by EMS

## 2016-08-24 LAB — CBC WITH DIFFERENTIAL/PLATELET
BASOS PCT: 0 %
Basophils Absolute: 0 10*3/uL (ref 0.0–0.1)
EOS ABS: 0.1 10*3/uL (ref 0.0–0.7)
Eosinophils Relative: 1 %
HEMATOCRIT: 38.8 % (ref 36.0–46.0)
HEMOGLOBIN: 12.2 g/dL (ref 12.0–15.0)
LYMPHS ABS: 2 10*3/uL (ref 0.7–4.0)
Lymphocytes Relative: 25 %
MCH: 24.1 pg — AB (ref 26.0–34.0)
MCHC: 31.4 g/dL (ref 30.0–36.0)
MCV: 76.7 fL — ABNORMAL LOW (ref 78.0–100.0)
MONO ABS: 0.4 10*3/uL (ref 0.1–1.0)
MONOS PCT: 5 %
NEUTROS ABS: 5.6 10*3/uL (ref 1.7–7.7)
NEUTROS PCT: 69 %
Platelets: 340 10*3/uL (ref 150–400)
RBC: 5.06 MIL/uL (ref 3.87–5.11)
RDW: 13.8 % (ref 11.5–15.5)
WBC: 8.1 10*3/uL (ref 4.0–10.5)

## 2016-08-24 LAB — RAPID URINE DRUG SCREEN, HOSP PERFORMED
AMPHETAMINES: NOT DETECTED
BARBITURATES: NOT DETECTED
BENZODIAZEPINES: NOT DETECTED
Cocaine: NOT DETECTED
Opiates: NOT DETECTED
Tetrahydrocannabinol: POSITIVE — AB

## 2016-08-24 LAB — CBG MONITORING, ED: Glucose-Capillary: 127 mg/dL — ABNORMAL HIGH (ref 65–99)

## 2016-08-24 LAB — COMPREHENSIVE METABOLIC PANEL
ALBUMIN: 4.5 g/dL (ref 3.5–5.0)
ALT: 17 U/L (ref 14–54)
AST: 21 U/L (ref 15–41)
Alkaline Phosphatase: 42 U/L (ref 38–126)
Anion gap: 6 (ref 5–15)
BUN: 9 mg/dL (ref 6–20)
CHLORIDE: 106 mmol/L (ref 101–111)
CO2: 26 mmol/L (ref 22–32)
CREATININE: 0.66 mg/dL (ref 0.44–1.00)
Calcium: 9.4 mg/dL (ref 8.9–10.3)
GFR calc Af Amer: >60 mL/min (ref >60)
GFR calc non Af Amer: >60 mL/min (ref >60)
GLUCOSE: 125 mg/dL — AB (ref 65–99)
POTASSIUM: 3.6 mmol/L (ref 3.5–5.1)
Sodium: 138 mmol/L (ref 135–145)
Total Bilirubin: 0.5 mg/dL (ref 0.3–1.2)
Total Protein: 8 g/dL (ref 6.5–8.1)

## 2016-08-24 NOTE — ED Notes (Signed)
Pt called to be unhooked from the monitor so that she could use the bathroom, she is  ambulating to the bathroom without assistance, denies weakness or feeling dizzy.

## 2016-08-24 NOTE — ED Provider Notes (Signed)
Medical screening examination/treatment/procedure(s) were performed by non-physician practitioner and as supervising physician I was immediately available for consultation/collaboration.   EKG Interpretation  Date/Time:  Saturday August 24 2016 00:35:18 EDT Ventricular Rate:  80 PR Interval:    QRS Duration: 92 QT Interval:  366 QTC Calculation: 423 R Axis:   63 Text Interpretation:  Sinus rhythm Probable left ventricular hypertrophy Confirmed by [facility]  MD, Morene AntuAPRIL ([id number]) on 08/24/2016 12:39:48 AM Also confirmed by [facility]  MD, Ilisa Hayworth ([id number]), editor Misty StanleyScales-Price, Shannon [phone])  on 08/24/2016 9:12:56 AM        Rhylen Shaheen, MD 08/24/16 2325

## 2016-10-30 ENCOUNTER — Encounter: Payer: Self-pay | Admitting: Obstetrics & Gynecology

## 2016-10-30 ENCOUNTER — Ambulatory Visit (INDEPENDENT_AMBULATORY_CARE_PROVIDER_SITE_OTHER): Payer: Medicaid Other | Admitting: Clinical

## 2016-10-30 ENCOUNTER — Ambulatory Visit (INDEPENDENT_AMBULATORY_CARE_PROVIDER_SITE_OTHER): Payer: Medicaid Other | Admitting: Obstetrics & Gynecology

## 2016-10-30 VITALS — BP 141/76 | HR 94 | Wt 268.1 lb

## 2016-10-30 DIAGNOSIS — R635 Abnormal weight gain: Secondary | ICD-10-CM

## 2016-10-30 DIAGNOSIS — R102 Pelvic and perineal pain: Secondary | ICD-10-CM | POA: Diagnosis not present

## 2016-10-30 DIAGNOSIS — N83201 Unspecified ovarian cyst, right side: Secondary | ICD-10-CM | POA: Diagnosis not present

## 2016-10-30 DIAGNOSIS — Z30433 Encounter for removal and reinsertion of intrauterine contraceptive device: Secondary | ICD-10-CM

## 2016-10-30 DIAGNOSIS — F411 Generalized anxiety disorder: Secondary | ICD-10-CM | POA: Diagnosis not present

## 2016-10-30 LAB — POCT PREGNANCY, URINE: Preg Test, Ur: NEGATIVE

## 2016-10-30 MED ORDER — LEVONORGESTREL 18.6 MCG/DAY IU IUD
INTRAUTERINE_SYSTEM | Freq: Once | INTRAUTERINE | Status: AC
  Administered 2016-10-30: 1 via INTRAUTERINE

## 2016-10-30 NOTE — BH Specialist Note (Addendum)
Integrated Behavioral Health Initial Visit  MRN: [id number] Name: Whitney White   Session Start time: 3:25 Session End time: 3:55 Total time: 30 minutes  Type of Service: Integrated Behavioral Health- Individual/Family Interpretor:No. Interpretor Name and Language: n/a   Warm Hand Off Completed.       SUBJECTIVE: Whitney White is a 41 y.o. female accompanied by patient. Patient was referred by Dr Marice Potterove for anxiety. Patient reports the following symptoms/concerns: Pt states her primary concern is an increasing irrational fear of dying, stemming from her own mother's death when she was a child, and triggered by recent life stress; would like to find ways to cope with anxiety and panic that she could implement today. Duration of problem: Undetermined number of years; Severity of problem: moderate  OBJECTIVE: Mood: Anxious and Affect: Appropriate Risk of harm to self or others: No plan to harm self or others   LIFE CONTEXT: Family and Social: Lives with four children, 2 with autism School/Work: - Self-Care: - Life Changes:   GOALS ADDRESSED: Patient will reduce symptoms of: anxiety, depression and stress and increase knowledge and/or ability of: self-management skills and also: Increase healthy adjustment to current life circumstances   INTERVENTIONS: Mindfulness or Management consultantelaxation Training, Psychoeducation and/or Health Education and Link to [employer] [employer]  Standardized Assessments completed: GAD-7 and PHQ 9  ASSESSMENT: Patient currently experiencing Generalized anxiety disorder. Patient may benefit from psychoeducation and brief therapeutic interventions regarding coping with symptoms of anxiety.  PLAN: 1. Follow up with behavioral health clinician on: As needed.([facility] 2. Behavioral recommendations:  -Practice CALM relaxation breathing exercise every morning, after children go to school -Consider apps as additional self-coping  strategy -[facility]  -Go to upcoming [facility] appointment with new PCP 3. Referral(s): [facility]  Valetta CloseJamie C [place], [place]   Depression screen [facility] 2/9 10/30/2016 01/06/2014  Decreased Interest 1 1  Down, Depressed, Hopeless 1 2  PHQ - 2 Score 2 3  Altered sleeping 0 1  Tired, decreased energy 2 2  Change in appetite 3 1  Feeling bad or failure about yourself  2 1  Trouble concentrating 0 3  Moving slowly or fidgety/restless 0 3  Suicidal thoughts 0 0  PHQ-9 Score 9 14   GAD 7 : Generalized Anxiety Score 10/30/2016  Nervous, Anxious, on Edge 3  Control/stop worrying 3  Worry too much - different things 3  Trouble relaxing 2  Restless 0  Easily annoyed or irritable 2  Afraid - awful might happen 3  Total GAD 7 Score 16

## 2016-10-30 NOTE — Progress Notes (Signed)
   Subjective:    Patient ID: Whitney White, female    DOB: 1975/11/04, 41 y.o.   MRN: [id number]  HPI 41 yo S AA P4 (16, 14, 7411, and 39 yo kids) here today for follow up after a [facility] ER visit 3/18 for pelvic pain. She reports that she still gets "occasional" bilateral pelvic pain. She takes Tylenol prn which helps "sometimes".    Review of Systems Abstinent for the last 2 years No periods since 3/18. Prior to this, she has had basically regular short periods with the IUD in.  Currently not employed FH- + breast cancer- maunt, no gyn or colon cancer [facility] Pap normal 2015 She is under a lot of stress    Objective:   Physical Exam [id number] Breathing, conversing, and ambulating normally UPT negative, consent signed, Time out procedure done. Intact Mirena easily removed. Cervix prepped with betadine and grasped with a single tooth tenaculum. Liletta was easily placed and the strings were cut to 3-4 cm. Uterus sounded to 9 cm. She tolerated the procedure well.      Assessment & Plan:  Expired IUD- She would like it removed and replaced Weight gain- check TSH Stress- see Jaimie Right ovarian cyst- follow up u/s RTC 4 weeks

## 2016-10-30 NOTE — Addendum Note (Signed)
Addended by: Sherre LainASH, Naim Murtha A on: 10/30/2016 03:26 PM   Modules accepted: Orders

## 2016-10-31 LAB — TSH: TSH: 1.11 u[IU]/mL (ref 0.450–4.500)

## 2016-11-02 ENCOUNTER — Encounter (HOSPITAL_COMMUNITY): Payer: Self-pay | Admitting: *Deleted

## 2016-11-02 ENCOUNTER — Ambulatory Visit (HOSPITAL_COMMUNITY)
Admission: EM | Admit: 2016-11-02 | Discharge: 2016-11-02 | Disposition: A | Discharge status: Home / Self Care | Payer: Medicaid Other | Attending: Internal Medicine | Admitting: Internal Medicine

## 2016-11-02 DIAGNOSIS — Z760 Encounter for issue of repeat prescription: Secondary | ICD-10-CM | POA: Diagnosis not present

## 2016-11-02 DIAGNOSIS — I10 Essential (primary) hypertension: Secondary | ICD-10-CM | POA: Diagnosis not present

## 2016-11-02 HISTORY — DX: Type 2 diabetes mellitus without complications: E11.9

## 2016-11-02 LAB — GLUCOSE, CAPILLARY: Glucose-Capillary: 84 mg/dL (ref 65–99)

## 2016-11-02 MED ORDER — AMLODIPINE BESYLATE 5 MG PO TABS: 5.0000 mg | ORAL_TABLET | Freq: Every day | ORAL | 0 refills | Status: DC

## 2016-11-02 NOTE — ED Triage Notes (Signed)
Reports running out of HTN med 5 days ago, but hasn't had full dose in approx 1 wk due to "rationing them out".  Has appt with new PCP 6/15.  C/O HA and double vision.

## 2016-11-02 NOTE — ED Provider Notes (Signed)
CSN: [id number]     Arrival date & time 11/02/16  1254 History   None    Chief Complaint  Patient presents with  . Medication Refill   (Consider location/radiation/quality/duration/timing/severity/associated sxs/prior Treatment) 41 y.o. female presents for prescription refill for HTN medication. Patient states that she is between providers and has an appointment with her new provider on June 15. Patient states that she has been symptomatic with headaches and occasional blurred vision. Patient is also requesting that she have her blood sugar checked while at this facility.       Past Medical History:  Diagnosis Date  . Depression   . Diabetes mellitus without complication (HCC)    "pre-diabetic"  . Eczema   . GERD (gastroesophageal reflux disease)   . Gestational hypertension   . Hypertension    Past Surgical History:  Procedure Laterality Date  . NO PAST SURGERIES     Family History  Problem Relation Age of Onset  . Hypertension Mother   . Stroke Mother   . Diabetes Maternal Grandmother   . Hypertension Maternal Grandmother   . Cancer Maternal Aunt        breast cancer   . Anesthesia problems Neg Hx    Social History  Substance Use Topics  . Smoking status: Former Games developermoker  . Smokeless tobacco: Never Used  . Alcohol use No   OB History    Gravida Para Term Preterm AB Living   4 4 4  0 0 4   SAB TAB Ectopic Multiple Live Births   0 0 0 0 1     Review of Systems  Allergies  Patient has no known allergies.  Home Medications   Prior to Admission medications   Medication Sig Start Date End Date Taking? Authorizing Provider  clonazePAM (KLONOPIN) 0.5 MG tablet Take 1 tablet (0.5 mg total) by mouth 3 times/day as needed-between meals & bedtime for anxiety. 07/29/14  Yes Ambrose FinlandKeck, Valerie A, NP  levonorgestrel (MIRENA) 20 MCG/24HR IUD 1 each by Intrauterine route once.   Yes [provider]  amLODipine (NORVASC) 5 MG tablet Take 1 tablet (5 mg total) by mouth  daily. Must have office visit for refills 11/02/16   Alene Miresmohundro, Gursimran Litaker C, NP  ibuprofen (ADVIL,MOTRIN) 600 MG tablet Take 1 tablet (600 mg total) by mouth every 6 (six) hours as needed. Patient not taking: Reported on 08/23/2016 08/19/16   Joy, Ines BloomerShawn C, PA-C  lidocaine (LIDODERM) 5 % Place 1 patch onto the skin daily. Remove & Discard patch within 12 hours or as directed by MD Patient not taking: Reported on 08/23/2016 08/19/16   Joy, Hillard DankerShawn C, PA-C  methocarbamol (ROBAXIN) 500 MG tablet Take 1 tablet (500 mg total) by mouth 2 (two) times daily. Patient not taking: Reported on 08/23/2016 08/19/16   Harolyn RutherfordJoy, Shawn C, PA-C  metroNIDAZOLE (FLAGYL) 500 MG tablet Take 1 tablet (500 mg total) by mouth 2 (two) times daily. Patient not taking: Reported on 10/30/2016 08/19/16   Joy, Ines BloomerShawn C, PA-C  naproxen (NAPROSYN) 375 MG tablet Take 1 tablet (375 mg total) by mouth 2 (two) times daily. Patient not taking: Reported on 08/19/2016 06/27/16   Demetrios LollLeaphart, Kenneth T, PA-C  sodium chloride (OCEAN) 0.65 % SOLN nasal spray Place 1 spray into both nostrils as needed for congestion.    [provider]   Meds Ordered and Administered this Visit  Medications - No data to display  BP 128/69   Pulse 86   Temp 98.6 F (37 C) (Oral)  Resp 16   SpO2 97%  No data found.   Physical Exam  Urgent Care Course     Procedures (including critical care time)  Labs Review Labs Reviewed  GLUCOSE, CAPILLARY    Imaging Review No results found.      MDM   1. Medication refill       Alene Mires, NP 11/02/16 1424

## 2016-11-06 ENCOUNTER — Ambulatory Visit (HOSPITAL_COMMUNITY)
Admission: RE | Admit: 2016-11-06 | Discharge: 2016-11-06 | Disposition: A | Discharge status: Home / Self Care | Payer: Medicaid Other | Source: Ambulatory Visit | Attending: Obstetrics & Gynecology | Admitting: Obstetrics & Gynecology

## 2016-11-06 DIAGNOSIS — N83201 Unspecified ovarian cyst, right side: Secondary | ICD-10-CM | POA: Diagnosis not present

## 2016-11-15 ENCOUNTER — Ambulatory Visit: Payer: Medicaid Other | Attending: Internal Medicine | Admitting: Internal Medicine

## 2016-11-15 ENCOUNTER — Encounter: Payer: Self-pay | Admitting: Internal Medicine

## 2016-11-15 VITALS — BP 131/91 | HR 89 | Temp 98.0°F | Resp 17 | Ht 62.75 in | Wt 265.0 lb

## 2016-11-15 DIAGNOSIS — R0683 Snoring: Secondary | ICD-10-CM

## 2016-11-15 DIAGNOSIS — R0600 Dyspnea, unspecified: Secondary | ICD-10-CM

## 2016-11-15 DIAGNOSIS — F329 Major depressive disorder, single episode, unspecified: Secondary | ICD-10-CM | POA: Insufficient documentation

## 2016-11-15 DIAGNOSIS — R0609 Other forms of dyspnea: Secondary | ICD-10-CM | POA: Diagnosis not present

## 2016-11-15 DIAGNOSIS — R0602 Shortness of breath: Secondary | ICD-10-CM | POA: Diagnosis not present

## 2016-11-15 DIAGNOSIS — Z87891 Personal history of nicotine dependence: Secondary | ICD-10-CM | POA: Diagnosis not present

## 2016-11-15 DIAGNOSIS — F419 Anxiety disorder, unspecified: Secondary | ICD-10-CM | POA: Diagnosis not present

## 2016-11-15 DIAGNOSIS — M7989 Other specified soft tissue disorders: Secondary | ICD-10-CM | POA: Diagnosis not present

## 2016-11-15 DIAGNOSIS — K219 Gastro-esophageal reflux disease without esophagitis: Secondary | ICD-10-CM | POA: Insufficient documentation

## 2016-11-15 DIAGNOSIS — I10 Essential (primary) hypertension: Secondary | ICD-10-CM | POA: Diagnosis present

## 2016-11-15 DIAGNOSIS — F411 Generalized anxiety disorder: Secondary | ICD-10-CM | POA: Diagnosis not present

## 2016-11-15 MED ORDER — LISINOPRIL-HYDROCHLOROTHIAZIDE 10-12.5 MG PO TABS: 1.0000 | ORAL_TABLET | Freq: Every day | ORAL | 3 refills | Status: DC

## 2016-11-15 MED ORDER — HYDROXYZINE HCL 25 MG PO TABS: 25.0000 mg | ORAL_TABLET | Freq: Every day | ORAL | 1 refills | Status: DC | PRN

## 2016-11-15 MED ORDER — ESCITALOPRAM OXALATE 10 MG PO TABS: 10.0000 mg | ORAL_TABLET | Freq: Every day | ORAL | 4 refills | Status: DC

## 2016-11-15 NOTE — Progress Notes (Signed)
Patient ID: Whitney White, female    DOB: 25-Jan-1976  MRN: [id number]  CC: Gastroesophageal Reflux; Hypertension; Anxiety; and Foot Pain (Onset 2 months with walking/ also swelling)   Subjective: Whitney White is a 41 y.o. female who presents to become re-est.  Last seen 2016. Her concerns today include:  Patient with history of hypertension, anxiety/depressionr, GERD and obesity. She was being seen at [facility].  She was not pleased with care she was receiving so decided to re-est wiith [place].   1.HTN -seen in UC 6/2 for RF Norvasc.  Out for 1 wk at that time.  Compliant since then. -no device to check BP at home -limits salt -C/o swelling in feet x 2 wks. Worse at end of day. No swelling in mornings +SOB when going up steps x 6 mths.  Lives on 2nd floor apartment building x 15 yrs and had no problems climbing the steps until about 6 months ago. "I can't go up a flight of steps without stopping to catch my breath." -no SOB at rest or at nights. Sleeps on 2 pillows -no CP, wheezing or cough -endorses loud snoring and sometimes stops breathing in her sleep as noted by her children  2. Obesity -"My wgh has become an issue." -don't eat fried foods and I try to limit the carbs that I eat.  Difficult because of limited budget -gained 25 lbs since 08/2016 -had started walking around 1 mile trail in May.  Initially can go around the trail twice but now cannot make it around once because of shortness of breath and pain in her feet   3.  Having increase panic attacks. Sometimes 10x a day.  -was on Clonazepam in past. Did ok taking it once a day -was seeing a psychiatist at [facility] but has not been seen in a while  Patient Active Problem List   Diagnosis Date Noted  . Anxiety state, unspecified 12/22/2013  . Obesity, unspecified 12/22/2013  . Chest pain 12/17/2013  . Depression   . Hypertension   . Eczema   . Gestational hypertension   . GERD (gastroesophageal reflux  disease)      Current Outpatient Prescriptions on File Prior to Visit  Medication Sig Dispense Refill  . levonorgestrel (MIRENA) 20 MCG/24HR IUD 1 each by Intrauterine route once.     No current facility-administered medications on file prior to visit.     No Known Allergies  Social History   Social History  . Marital status: Divorced    Spouse name: N/A  . Number of children: N/A  . Years of education: N/A   Occupational History  . Not on file.   Social History Main Topics  . Smoking status: Former Games developer  . Smokeless tobacco: Never Used  . Alcohol use No  . Drug use: No  . Sexual activity: Not on file     Comment: merania removed   Other Topics Concern  . Not on file   Social History Narrative  . No narrative on file    Family History  Problem Relation Age of Onset  . Hypertension Mother   . Stroke Mother   . Diabetes Maternal Grandmother   . Hypertension Maternal Grandmother   . Cancer Maternal Aunt        breast cancer   . Anesthesia problems Neg Hx     Past Surgical History:  Procedure Laterality Date  . NO PAST SURGERIES      ROS: Review of  Systems -As stated above  PHYSICAL EXAM: BP (!) 131/91 (BP Location: Right Arm, Patient Position: Sitting, Cuff Size: Large)   Pulse 89   Temp 98 F (36.7 C) (Oral)   Resp 17   Ht 5' 2.75" (1.594 m)   Wt 265 lb (120.2 kg)   SpO2 97%   BMI 47.32 kg/m   Wt Readings from Last 3 Encounters:  11/15/16 265 lb (120.2 kg)  10/30/16 268 lb 1.6 oz (121.6 kg)  08/23/16 240 lb (108.9 kg)   Physical Exam  General appearance - alert, well appearing, morbidly obese African-American female and in no distress Mental status - alert, oriented to person, place, and time, normal mood, behavior, speech, dress, motor activity, and thought processes Mouth - mucous membranes moist, pharynx normal without lesions Neck - no thyroid enlargement or thyroid nodules. No cervical lymphadenopathy Chest - clear to auscultation,  no wheezes, rales or rhonchi, symmetric air entry Heart - normal rate, regular rhythm, normal S1, S2, no murmurs, rubs, clicks or gallops Extremities - trace to 1+ pedal edema    Chemistry      Component Value Date/Time   NA 138 08/24/2016 0042   K 3.6 08/24/2016 0042   CL 106 08/24/2016 0042   CO2 26 08/24/2016 0042   BUN 9 08/24/2016 0042   CREATININE 0.66 08/24/2016 0042      Component Value Date/Time   CALCIUM 9.4 08/24/2016 0042   ALKPHOS 42 08/24/2016 0042   AST 21 08/24/2016 0042   ALT 17 08/24/2016 0042   BILITOT 0.5 08/24/2016 0042     Lab Results  Component Value Date   WBC 8.1 08/24/2016    Depression screen PHQ 2/9 11/15/2016  Decreased Interest 1  Down, Depressed, Hopeless 2  PHQ - 2 Score 3  Altered sleeping 0  Tired, decreased energy 2  Change in appetite 3  Feeling bad or failure about yourself  2  Trouble concentrating 0  Moving slowly or fidgety/restless 0  Suicidal thoughts 0  PHQ-9 Score 10   GAD 7 : Generalized Anxiety Score 11/15/2016 10/30/2016  Nervous, Anxious, on Edge 3 3  Control/stop worrying 3 3  Worry too much - different things 3 3  Trouble relaxing 3 2  Restless 0 0  Easily annoyed or irritable 3 2  Afraid - awful might happen 3 3  Total GAD 7 Score 18 16   ASSESSMENT AND PLAN: 1. Essential hypertension -Norvasc may be contributing to the lower extremity edema so we will have her stop that and  put her on Prinzide instead DASH diet discussed - lisinopril-hydrochlorothiazide (PRINZIDE,ZESTORETIC) 10-12.5 MG tablet; Take 1 tablet by mouth daily.  Dispense: 90 tablet; Refill: 3  2. Morbid obesity due to excess calories (HCC) -I think her feet may be contributing to increased shortness of breath over the past few months. -It's imperative that she lose this weight. Dietary counseling discussed. I encouraged her to try to walk half a mile several times a week. - Hemoglobin A1c - Lipid Panel  3. Anxiety disorder, unspecified  type -Patient agreeable to trying Lexapro daily and hydroxyzine when necessary. I warned that the latter can cause drowsiness. We will refer her to psychiatry. - escitalopram (LEXAPRO) 10 MG tablet; Take 1 tablet (10 mg total) by mouth daily.  Dispense: 30 tablet; Refill: 4 - hydrOXYzine (ATARAX/VISTARIL) 25 MG tablet; Take 1 tablet (25 mg total) by mouth daily as needed for anxiety.  Dispense: 30 tablet; Refill: 1  4. Habitual snoring - Nocturnal  polysomnography (NPSG); Future  5. DOE (dyspnea on exertion) - Pro b natriuretic peptide - ECHOCARDIOGRAM COMPLETE; Future  Patient was given the opportunity to ask questions.  Patient verbalized understanding of the plan and was able to repeat key elements of the plan.   Orders Placed This Encounter  Procedures  . Hemoglobin A1c  . Pro b natriuretic peptide  . Lipid Panel  . ECHOCARDIOGRAM COMPLETE  . Nocturnal polysomnography (NPSG)     Requested Prescriptions   Signed Prescriptions Disp Refills  . lisinopril-hydrochlorothiazide (PRINZIDE,ZESTORETIC) 10-12.5 MG tablet 90 tablet 3    Sig: Take 1 tablet by mouth daily.  Marland Kitchen escitalopram (LEXAPRO) 10 MG tablet 30 tablet 4    Sig: Take 1 tablet (10 mg total) by mouth daily.  . hydrOXYzine (ATARAX/VISTARIL) 25 MG tablet 30 tablet 1    Sig: Take 1 tablet (25 mg total) by mouth daily as needed for anxiety.    Return in about 3 weeks (around 12/06/2016).  Jonah Rathgeber, MD, FACP

## 2016-11-15 NOTE — Patient Instructions (Addendum)
Stop Norvasc.  Start Lisinopril/HCTZ. Continue to limit salt in foods.  Start Lexapro and Hydroxyzine as discussed for anxiety.   You have been referred for sleep study and Echo.   Follow a Healthy Eating Plan - You can do it! Limit sugary drinks.  Avoid sodas, sweet tea, sport or energy drinks, or fruit drinks.  Drink water, lo-fat milk, or diet drinks. Limit snack foods.   Cut back on candy, cake, cookies, chips, ice cream.  These are a special treat, only in small amounts. Eat plenty of vegetables.  Especially dark green, red, and orange vegetables. Aim for at least 3 servings a day. More is better! Include fruit in your daily diet.  Whole fruit is much healthier than fruit juice! Limit "white" bread, "white" pasta, "white" rice.   Choose "100% whole grain" products, brown or wild rice. Avoid fatty meats. Try "Meatless Monday" and choose eggs or beans one day a week.  When eating meat, choose lean meats like chicken, [place], and fish.  Grill, broil, or bake meats instead of frying, and eat poultry without the skin. Eat less salt.  Avoid frozen pizzas, frozen dinners and salty foods.  Use seasonings other than salt in cooking.  This can help blood pressure and keep you from swelling Beer, wine and liquor have calories.  If you can safely drink alcohol, limit to 1 drink per day for women, 2 drinks for men  Escitalopram tablets What is this medicine? ESCITALOPRAM (es sye TAL oh pram) is used to treat depression and certain types of anxiety. This medicine may be used for other purposes; ask your health care provider or pharmacist if you have questions. COMMON BRAND NAME(S): Lexapro What should I tell my health care provider before I take this medicine? They need to know if you have any of these conditions: -bipolar disorder or a family history of bipolar disorder -diabetes -glaucoma -heart disease -kidney or liver disease -receiving electroconvulsive therapy -seizures  (convulsions) -suicidal thoughts, plans, or attempt by you or a family member -an unusual or allergic reaction to escitalopram, the related drug citalopram, other medicines, foods, dyes, or preservatives -pregnant or trying to become pregnant -breast-feeding How should I use this medicine? Take this medicine by mouth with a glass of water. Follow the directions on the prescription label. You can take it with or without food. If it upsets your stomach, take it with food. Take your medicine at regular intervals. Do not take it more often than directed. Do not stop taking this medicine suddenly except upon the advice of your doctor. Stopping this medicine too quickly may cause serious side effects or your condition may worsen. A special MedGuide will be given to you by the pharmacist with each prescription and refill. Be sure to read this information carefully each time. Talk to your pediatrician regarding the use of this medicine in children. Special care may be needed. Overdosage: If you think you have taken too much of this medicine contact a poison control center or emergency room at once. NOTE: This medicine is only for you. Do not share this medicine with others. What if I miss a dose? If you miss a dose, take it as soon as you can. If it is almost time for your next dose, take only that dose. Do not take double or extra doses. What may interact with this medicine? Do not take this medicine with any of the following medications: -certain medicines for fungal infections like fluconazole, itraconazole, ketoconazole, posaconazole, voriconazole -cisapride -  citalopram -dofetilide -dronedarone -linezolid -MAOIs like Carbex, Eldepryl, Marplan, Nardil, and Parnate -methylene Bleicher (injected into a vein) -pimozide -thioridazine -ziprasidone This medicine may also interact with the following medications: -alcohol -amphetamines -aspirin and aspirin-like medicines -carbamazepine -certain  medicines for depression, anxiety, or psychotic disturbances -certain medicines for migraine headache like almotriptan, eletriptan, frovatriptan, naratriptan, rizatriptan, sumatriptan, zolmitriptan -certain medicines for sleep -certain medicines that treat or prevent blood clots like warfarin, enoxaparin, dalteparin -cimetidine -diuretics -fentanyl -furazolidone -isoniazid -lithium -metoprolol -NSAIDs, medicines for pain and inflammation, like ibuprofen or naproxen -other medicines that prolong the QT interval (cause an abnormal heart rhythm) -procarbazine -rasagiline -supplements like St. John's wort, kava kava, valerian -tramadol -tryptophan This list may not describe all possible interactions. Give your health care provider a list of all the medicines, herbs, non-prescription drugs, or dietary supplements you use. Also tell them if you smoke, drink alcohol, or use illegal drugs. Some items may interact with your medicine. What should I watch for while using this medicine? Tell your doctor if your symptoms do not get better or if they get worse. Visit your doctor or health care professional for regular checks on your progress. Because it may take several weeks to see the full effects of this medicine, it is important to continue your treatment as prescribed by your doctor. Patients and their families should watch out for new or worsening thoughts of suicide or depression. Also watch out for sudden changes in feelings such as feeling anxious, agitated, panicky, irritable, hostile, aggressive, impulsive, severely restless, overly excited and hyperactive, or not being able to sleep. If this happens, especially at the beginning of treatment or after a change in dose, call your health care professional. Bonita Quin may get drowsy or dizzy. Do not drive, use machinery, or do anything that needs mental alertness until you know how this medicine affects you. Do not stand or sit up quickly, especially if you  are an older patient. This reduces the risk of dizzy or fainting spells. Alcohol may interfere with the effect of this medicine. Avoid alcoholic drinks. Your mouth may get dry. Chewing sugarless gum or sucking hard candy, and drinking plenty of water may help. Contact your doctor if the problem does not go away or is severe. What side effects may I notice from receiving this medicine? Side effects that you should report to your doctor or health care professional as soon as possible: -allergic reactions like skin rash, itching or hives, swelling of the face, lips, or tongue -anxious -black, tarry stools -changes in vision -confusion -elevated mood, decreased need for sleep, racing thoughts, impulsive behavior -eye pain -fast, irregular heartbeat -feeling faint or lightheaded, falls -feeling agitated, angry, or irritable -hallucination, loss of contact with reality -loss of balance or coordination -loss of memory -painful or prolonged erections -restlessness, pacing, inability to keep still -seizures -stiff muscles -suicidal thoughts or other mood changes -trouble sleeping -unusual bleeding or bruising -unusually weak or tired -vomiting Side effects that usually do not require medical attention (report to your doctor or health care professional if they continue or are bothersome): -changes in appetite -change in sex drive or performance -headache -increased sweating -indigestion, nausea -tremors This list may not describe all possible side effects. Call your doctor for medical advice about side effects. You may report side effects to FDA at 1-800-FDA-1088. Where should I keep my medicine? Keep out of reach of children. Store at room temperature between 15 and 30 degrees C (59 and 86 degrees F). Throw away any unused  medicine after the expiration date. NOTE: This sheet is a summary. It may not cover all possible information. If you have questions about this medicine, talk to your  doctor, pharmacist, or health care provider.  2018 Elsevier/Gold Standard (2015-10-23 13:20:23)   [facility] for an echocardiagram on Friday, June 29th at 10:00 am. Go in main entrance and ask for radiology.

## 2016-11-16 ENCOUNTER — Other Ambulatory Visit: Payer: Self-pay | Admitting: Internal Medicine

## 2016-11-16 LAB — PRO B NATRIURETIC PEPTIDE: NT-Pro BNP: 16 pg/mL (ref 0–130)

## 2016-11-16 LAB — LIPID PANEL
CHOLESTEROL TOTAL: 176 mg/dL (ref 100–199)
Chol/HDL Ratio: 3.8 ratio (ref 0.0–4.4)
HDL: 46 mg/dL (ref >39)
LDL CALC: 117 mg/dL — AB (ref 0–99)
Triglycerides: 66 mg/dL (ref 0–149)
VLDL Cholesterol Cal: 13 mg/dL (ref 5–40)

## 2016-11-16 LAB — HEMOGLOBIN A1C
ESTIMATED AVERAGE GLUCOSE: 128 mg/dL
HEMOGLOBIN A1C: 6.1 % — AB (ref 4.8–5.6)

## 2016-11-16 MED ORDER — METFORMIN HCL 500 MG PO TABS: 500.0000 mg | ORAL_TABLET | Freq: Every day | ORAL | 3 refills | Status: DC

## 2016-11-20 ENCOUNTER — Other Ambulatory Visit: Payer: Self-pay

## 2016-11-20 ENCOUNTER — Telehealth: Payer: Self-pay

## 2016-11-20 MED ORDER — METFORMIN HCL 500 MG PO TABS: 500.0000 mg | ORAL_TABLET | Freq: Every day | ORAL | 3 refills | Status: DC

## 2016-11-20 NOTE — Telephone Encounter (Signed)
Contacted pt to go over lab results pt is aware of results. Pt [facility]. Pt doesn't have any questions or concerns

## 2016-11-25 ENCOUNTER — Telehealth: Payer: Self-pay | Admitting: Internal Medicine

## 2016-11-25 NOTE — Telephone Encounter (Signed)
PI called to speak with the nurse, since she is taking metFORMIN (GLUCOPHAGE) 500 MG tablet   She feel like her hart is going fast and she is very concern, please follow up

## 2016-11-25 NOTE — Telephone Encounter (Signed)
Per Lawana PaiJay'a she inform PT to stop taking her med and to see her tomorrow at the appt

## 2016-11-26 ENCOUNTER — Ambulatory Visit: Payer: Medicaid Other | Attending: Internal Medicine | Admitting: Internal Medicine

## 2016-11-26 ENCOUNTER — Encounter: Payer: Self-pay | Admitting: Internal Medicine

## 2016-11-26 VITALS — BP 125/85 | HR 88 | Temp 98.3°F | Resp 16 | Wt 257.8 lb

## 2016-11-26 DIAGNOSIS — I10 Essential (primary) hypertension: Secondary | ICD-10-CM

## 2016-11-26 DIAGNOSIS — R7303 Prediabetes: Secondary | ICD-10-CM | POA: Diagnosis not present

## 2016-11-26 DIAGNOSIS — F411 Generalized anxiety disorder: Secondary | ICD-10-CM | POA: Insufficient documentation

## 2016-11-26 DIAGNOSIS — L309 Dermatitis, unspecified: Secondary | ICD-10-CM | POA: Insufficient documentation

## 2016-11-26 DIAGNOSIS — E785 Hyperlipidemia, unspecified: Secondary | ICD-10-CM | POA: Diagnosis not present

## 2016-11-26 DIAGNOSIS — R0609 Other forms of dyspnea: Secondary | ICD-10-CM | POA: Diagnosis not present

## 2016-11-26 DIAGNOSIS — Z789 Other specified health status: Secondary | ICD-10-CM | POA: Diagnosis not present

## 2016-11-26 DIAGNOSIS — F329 Major depressive disorder, single episode, unspecified: Secondary | ICD-10-CM | POA: Diagnosis not present

## 2016-11-26 DIAGNOSIS — K219 Gastro-esophageal reflux disease without esophagitis: Secondary | ICD-10-CM | POA: Diagnosis not present

## 2016-11-26 NOTE — Patient Instructions (Signed)
Stop the metformin to see if the palpitations resolve. Keep your appointment for the echo later this week. Keep up the good works with healthy eating habits..Marland Kitchen

## 2016-11-26 NOTE — Progress Notes (Signed)
Patient ID: KAMRYN MESSINEO, female    DOB: Sep 08, 1975  MRN: [id number]  CC:  Med S.E  Subjective:  Whitney White is a 42 y.o. female who presents for UC visit. Her concerns today include:  Pt with history of hypertension, morbid obesity, anxiety disorder, DOE that is currently being worked up and newly diagnosed pre-diabetes.  1.  Pre-DM: -A1c of 6.1 on recent blood tests. Patient was started on metformin -c/o palpitations when she takes Metformin.  "Like my heart would be pounding." Symptoms would start about 1.5 hrs after taking it. Also notice some tremors of hands at nights.  TSH normal less than 1 mth ago.  Echo scheduled for later this wk. -she has not taken the med today -since last visit, she has cut out junk and high sugar foods. Drinking only water. Conscious about carbs and portion sizes -loss 8 lbs since last visit.   2. HTN: tolerating Lis/HCTZ -LE edema resolved. -limiting salt as much as she can  3.  I went over blood test from last visit with her.  BNP normal.  LDL mildly elevated.  -sleep study scheduled for August.  Patient Active Problem List   Diagnosis Date Noted  . Prediabetes 11/26/2016  . Hyperlipidemia 11/26/2016  . Anxiety state, unspecified 12/22/2013  . Obesity, unspecified 12/22/2013  . Chest pain 12/17/2013  . Depression   . Hypertension   . Eczema   . Gestational hypertension   . GERD (gastroesophageal reflux disease)      Current Outpatient Prescriptions on File Prior to Visit  Medication Sig Dispense Refill  . escitalopram (LEXAPRO) 10 MG tablet Take 1 tablet (10 mg total) by mouth daily. 30 tablet 4  . hydrOXYzine (ATARAX/VISTARIL) 25 MG tablet Take 1 tablet (25 mg total) by mouth daily as needed for anxiety. 30 tablet 1  . levonorgestrel (MIRENA) 20 MCG/24HR IUD 1 each by Intrauterine route once.    Marland Kitchen lisinopril-hydrochlorothiazide (PRINZIDE,ZESTORETIC) 10-12.5 MG tablet Take 1 tablet by mouth daily. 90 tablet 3   No current  facility-administered medications on file prior to visit.     Allergies  Allergen Reactions  . Metformin And Related     Caused palpitations/heart pounding    ROS: Review of Systems As stated above  PHYSICAL EXAM: BP 125/85   Pulse 88   Temp 98.3 F (36.8 C) (Oral)   Resp 16   Wt 257 lb 12.8 oz (116.9 kg)   SpO2 96%   BMI 46.03 kg/m   Wt Readings from Last 3 Encounters:  11/26/16 257 lb 12.8 oz (116.9 kg)  11/15/16 265 lb (120.2 kg)  10/30/16 268 lb 1.6 oz (121.6 kg)    Physical Exam General appearance - alert, well appearing, obese FM and in no distress Mental status - alert, oriented to person, place, and time, normal mood, behavior, speech, dress, motor activity, and thought processes Mouth - mucous membranes moist, pharynx normal without lesions Neck - supple, no significant adenopathy Chest - clear to auscultation, no wheezes, rales or rhonchi, symmetric air entry Heart - normal rate, regular rhythm, normal S1, S2, no murmurs, rubs, clicks or gallops Extremities - peripheral pulses normal, no pedal edema, no clubbing or cyanosis  Results for orders placed or performed in visit on 11/15/16  Hemoglobin A1c  Result Value Ref Range   Hgb A1c MFr Bld 6.1 (H) 4.8 - 5.6 %   Est. average glucose Bld gHb Est-mCnc 128 mg/dL  Pro b natriuretic peptide  Result Value Ref Range  NT-Pro BNP 16 0 - 130 pg/mL  Lipid Panel  Result Value Ref Range   Cholesterol, Total 176 100 - 199 mg/dL   Triglycerides 66 0 - 149 mg/dL   HDL 46 >16>39 mg/dL   VLDL Cholesterol Cal 13 5 - 40 mg/dL   LDL Calculated 109117 (H) 0 - 99 mg/dL   Chol/HDL Ratio 3.8 0.0 - 4.4 ratio   ASSESSMENT AND PLAN: 1. Medication intolerance -Patient to stop metformin. If the palpitations resolve, we will know that it was the medication. If not she will need further workup  2. Essential hypertension At goal. Continue current medication and DASH diet.  3. Prediabetes -Patient is doing well with changes in her  eating habits. She will start exercising. Discontinue the metformin for now due to #1 above.  4. Hyperlipidemia, unspecified hyperlipidemia type -Discussed importance of healthy eating and regular exercise.  Patient was given the opportunity to ask questions.  Patient verbalized understanding of the plan and was able to repeat key elements of the plan.   No orders of the defined types were placed in this encounter.    Requested Prescriptions    No prescriptions requested or ordered in this encounter    Future Appointments Date Time Provider Department Center  11/29/2016 10:00 AM [facility]  12/06/2016 9:00 AM Marcine MatarJohnson, Sadie Pickar B, MD [facility] None  01/20/2017 8:00 PM [facility] 3 [facility]    Jonah Blueeborah Manville Rico, MD, Jerrel IvoryFACP

## 2016-11-29 ENCOUNTER — Encounter: Payer: Self-pay | Admitting: Internal Medicine

## 2016-11-29 ENCOUNTER — Ambulatory Visit (HOSPITAL_COMMUNITY)
Admission: RE | Admit: 2016-11-29 | Discharge: 2016-11-29 | Disposition: A | Discharge status: Home / Self Care | Payer: Medicaid Other | Source: Ambulatory Visit | Attending: Internal Medicine | Admitting: Internal Medicine

## 2016-11-29 DIAGNOSIS — I5189 Other ill-defined heart diseases: Secondary | ICD-10-CM | POA: Diagnosis not present

## 2016-11-29 DIAGNOSIS — R0609 Other forms of dyspnea: Secondary | ICD-10-CM | POA: Diagnosis present

## 2016-11-29 DIAGNOSIS — R0600 Dyspnea, unspecified: Secondary | ICD-10-CM

## 2016-11-29 NOTE — Progress Notes (Signed)
  Echocardiogram 2D Echocardiogram has been performed.  Whitney White L Androw 11/29/2016, 10:54 AM

## 2016-12-06 ENCOUNTER — Ambulatory Visit: Payer: Medicaid Other | Attending: Internal Medicine | Admitting: Internal Medicine

## 2016-12-06 ENCOUNTER — Encounter: Payer: Self-pay | Admitting: Internal Medicine

## 2016-12-06 VITALS — BP 123/86 | HR 68 | Temp 98.0°F | Resp 16 | Wt 254.6 lb

## 2016-12-06 DIAGNOSIS — Z975 Presence of (intrauterine) contraceptive device: Secondary | ICD-10-CM | POA: Diagnosis not present

## 2016-12-06 DIAGNOSIS — Z888 Allergy status to other drugs, medicaments and biological substances status: Secondary | ICD-10-CM | POA: Insufficient documentation

## 2016-12-06 DIAGNOSIS — I519 Heart disease, unspecified: Secondary | ICD-10-CM | POA: Diagnosis not present

## 2016-12-06 DIAGNOSIS — L309 Dermatitis, unspecified: Secondary | ICD-10-CM | POA: Insufficient documentation

## 2016-12-06 DIAGNOSIS — I119 Hypertensive heart disease without heart failure: Secondary | ICD-10-CM | POA: Insufficient documentation

## 2016-12-06 DIAGNOSIS — K219 Gastro-esophageal reflux disease without esophagitis: Secondary | ICD-10-CM | POA: Insufficient documentation

## 2016-12-06 DIAGNOSIS — Z6841 Body Mass Index (BMI) 40.0 and over, adult: Secondary | ICD-10-CM | POA: Insufficient documentation

## 2016-12-06 DIAGNOSIS — R7303 Prediabetes: Secondary | ICD-10-CM | POA: Diagnosis not present

## 2016-12-06 DIAGNOSIS — E785 Hyperlipidemia, unspecified: Secondary | ICD-10-CM | POA: Diagnosis not present

## 2016-12-06 DIAGNOSIS — F419 Anxiety disorder, unspecified: Secondary | ICD-10-CM

## 2016-12-06 MED ORDER — ESCITALOPRAM OXALATE 20 MG PO TABS: 20.0000 mg | ORAL_TABLET | Freq: Every day | ORAL | 3 refills | Status: DC

## 2016-12-06 NOTE — Progress Notes (Signed)
Patient ID: Whitney White, female    DOB: 09/26/1975  MRN: [id number]  CC:  F/u DOE, obesity  Subjective:  Whitney White is a 41 y.o. female who presents for f/u on DOE and obesity. Her concerns today include:  Patient with history of hypertension, anxiety/depressionr, GERD, DOE, pre-DM, mild HL and obesity.  1. Obesity: -walking on a friend's treadmill 3 x a wk for 1 hr -run low on foods this time of mth but still trying to stay on diet and limit carbs.  Eat bread only once a wk -tries to avoid eating past 6 p.m but gets [place] later at night. Eats nuts at those times -getting up steps to her apartment easier and faster.  -loss 11 lbs in past 1 mth  2. Sleep study sch. For 01/20/2017  3. I went over results of Echo which showed mild diastolic dysfunction with LVH with EF 50-55%.  Pt wanted to know if she needs to be worried about it.  4.  Taking Lexapro. -had a few anxiety attacks this wk. -Hydroxyzine makes her sleepy.  Had to take 2 x since starting it.  Woke up last wk with an anxiety attack. Took the Hydroxyzine and was able to return to sleep. -[facility] 1-2 weeks Patient Active Problem List   Diagnosis Date Noted  . Prediabetes 11/26/2016  . Hyperlipidemia 11/26/2016  . Anxiety state, unspecified 12/22/2013  . Obesity, unspecified 12/22/2013  . Chest pain 12/17/2013  . Depression   . Hypertension   . Eczema   . Gestational hypertension   . GERD (gastroesophageal reflux disease)      Current Outpatient Prescriptions on File Prior to Visit  Medication Sig Dispense Refill  . hydrOXYzine (ATARAX/VISTARIL) 25 MG tablet Take 1 tablet (25 mg total) by mouth daily as needed for anxiety. 30 tablet 1  . levonorgestrel (MIRENA) 20 MCG/24HR IUD 1 each by Intrauterine route once.    Marland Kitchen. lisinopril-hydrochlorothiazide (PRINZIDE,ZESTORETIC) 10-12.5 MG tablet Take 1 tablet by mouth daily. 90 tablet 3   No current  facility-administered medications on file prior to visit.     Allergies  Allergen Reactions  . Metformin And Related     Caused palpitations/heart pounding     ROS: Review of Systems as stated above PHYSICAL EXAM: BP 123/86   Pulse 68   Temp 98 F (36.7 C) (Oral)   Resp 16   Wt 254 lb 9.6 oz (115.5 kg)   SpO2 96%   BMI 45.46 kg/m   Wt Readings from Last 3 Encounters:  12/06/16 254 lb 9.6 oz (115.5 kg)  11/26/16 257 lb 12.8 oz (116.9 kg)  11/15/16 265 lb (120.2 kg)   Physical Exam General appearance - alert, well appearing, obese female and in no distress Mental status - alert, oriented to person, place, and time, normal mood, behavior, speech, dress, motor activity, and thought processes Neck - supple, no significant adenopathy Chest - clear to auscultation, no wheezes, rales or rhonchi, symmetric air entry Heart - normal rate, regular rhythm, normal S1, S2, no murmurs, rubs, clicks or gallops Extremities - peripheral pulses normal, no pedal edema, no clubbing or cyanosis  ASSESSMENT AND PLAN: 1. Mild diastolic dysfunction -discussed dx with pt and stress importance of BP control. She is on Lis/HCTZ and tolerating  Fluid status by exam normal.  2. Morbid obesity due to excess calories [facility](HCC) -commended her on wgh loss so far.  Encouraged her to continue her exercise  routine and healthy eating habits. -advise to eat fresh fruits or nuts as snack at nights when she feels hungry.  3. Anxiety disorder, unspecified type -improved but still not as controlled as she would like.  Increase Lexapro to 20 mg daily - escitalopram (LEXAPRO) 20 MG tablet; Take 1 tablet (20 mg total) by mouth daily.  Dispense: 30 tablet; Refill: 3  Patient was given the opportunity to ask questions.  Patient verbalized understanding of the plan and was able to repeat key elements of the plan.   No orders of the defined types were placed in this encounter.    Requested Prescriptions   Signed  Prescriptions Disp Refills  . escitalopram (LEXAPRO) 20 MG tablet 30 tablet 3    Sig: Take 1 tablet (20 mg total) by mouth daily.    Future Appointments Date Time Provider Department Center  01/20/2017 8:00 PM [facility] 3 [facility]  03/10/2017 11:15 AM Marcine Matar, MD [facility] None    Jonah Avera, MD, Jerrel Ivory

## 2016-12-06 NOTE — Patient Instructions (Signed)
Increase Lexapro to 20 mg daily as discussed.   Keep up the good works with healthy eating plan and regular exercise.

## 2017-01-20 ENCOUNTER — Ambulatory Visit (HOSPITAL_BASED_OUTPATIENT_CLINIC_OR_DEPARTMENT_OTHER): Payer: Medicaid Other | Attending: Internal Medicine | Admitting: Internal Medicine

## 2017-01-20 VITALS — Ht 62.0 in | Wt 255.0 lb

## 2017-01-20 DIAGNOSIS — G4733 Obstructive sleep apnea (adult) (pediatric): Secondary | ICD-10-CM | POA: Diagnosis not present

## 2017-01-20 DIAGNOSIS — R0683 Snoring: Secondary | ICD-10-CM | POA: Diagnosis present

## 2017-01-25 DIAGNOSIS — G4733 Obstructive sleep apnea (adult) (pediatric): Secondary | ICD-10-CM | POA: Diagnosis not present

## 2017-01-25 NOTE — Procedures (Signed)
  Patient Name: Whitney White, Whitney White Date: 01/20/2017 Gender: Female D.O.B: Jan 31, 1976 Age (years): 41 Referring Provider: Jonah Badia MD Height (inches): 62 Interpreting Physician: Jetty Duhamel MD, ABSM Weight (lbs): 255 RPSGT: Celene Kras BMI: 47 MRN: [id number] Neck Size: 16.00 CLINICAL INFORMATION Sleep Study Type: NPSG  Indication for sleep study: Daytime Fatigue, Morbid Obesity, Obesity, Snoring  Epworth Sleepiness Score: 8  SLEEP STUDY TECHNIQUE As per the AASM Manual for the Scoring of Sleep and Associated Events v2.3 (April 2016) with a hypopnea requiring 4% desaturations.  The channels recorded and monitored were frontal, central and occipital EEG, electrooculogram (EOG), submentalis EMG (chin), nasal and oral airflow, thoracic and abdominal wall motion, anterior tibialis EMG, snore microphone, electrocardiogram, and pulse oximetry.  MEDICATIONS Medications self-administered by patient taken the night of the study : none reported  SLEEP ARCHITECTURE The study was initiated at 10:18:09 PM and ended at 4:33:44 AM.  Sleep onset time was 0.0 minutes and the sleep efficiency was 68.6%. The total sleep time was 257.8 minutes.  Stage REM latency was 325.3 minutes.  The patient spent 5.35% of the night in stage N1 sleep, 88.44% in stage N2 sleep, 0.58% in stage N3 and 5.62% in REM.  Alpha intrusion was absent.  Supine sleep was 70.74%.  RESPIRATORY PARAMETERS The overall apnea/hypopnea index (AHI) was 3.5 per hour. There were 0 total apneas, including 0 obstructive, 0 central and 0 mixed apneas. There were 15 hypopneas and 1 RERAs.  The AHI during Stage REM sleep was 0.0 per hour.  AHI while supine was 4.9 per hour.  The mean oxygen saturation was 94.62%. The minimum SpO2 during sleep was 89.00%.  Loud snoring was noted during this study.  CARDIAC DATA The 2 lead EKG demonstrated sinus rhythm. The mean heart rate was 78.12 beats per minute. Other EKG  findings include: None.  LEG MOVEMENT DATA The total PLMS were 96 with a resulting PLMS index of 22.34. Associated arousal with leg movement index was 7.4 .  IMPRESSIONS - No significant obstructive sleep apnea occurred during this study (AHI = 3.5/h). - No significant central sleep apnea occurred during this study (CAI = 0.0/h). - The patient had minimal or no oxygen desaturation during the study (Min O2 = 89.00%) - The patient snored with Loud snoring volume. - No cardiac abnormalities were noted during this study. - Mild periodic limb movements of sleep occurred during the study. Associated arousals were significant.  DIAGNOSIS - Periodic Limb Movement Syndrome (327.51 [G47.61 ICD-10]) - Primary Snoring (786.09 [R06.83 ICD-10])  RECOMMENDATIONS - Consider therapeutic trial for limb movement sleep disorder, such as Requip or Mirapex. - Management for snoring might include decongestants, chin strap. Other options based on clinical judgment. - Sleep hygiene should be reviewed to assess factors that may improve sleep quality. - Weight management and regular exercise should be initiated or continued if appropriate.  [Electronically signed] 01/25/2017 02:32 PM  Jetty Duhamel MD, ABSM Diplomate, American Board of Sleep Medicine   NPI: [id number]  Waymon Budge Diplomate, American Board of Sleep Medicine  ELECTRONICALLY SIGNED ON:  01/25/2017, 2:29 PM [facility] PH: [phone]   FX: [phone] ACCREDITED BY THE AMERICAN ACADEMY OF SLEEP MEDICINE

## 2017-01-26 ENCOUNTER — Other Ambulatory Visit: Payer: Self-pay | Admitting: Internal Medicine

## 2017-01-26 DIAGNOSIS — G2581 Restless legs syndrome: Secondary | ICD-10-CM

## 2017-03-10 ENCOUNTER — Ambulatory Visit: Payer: Medicaid Other | Attending: Internal Medicine | Admitting: Internal Medicine

## 2017-03-10 ENCOUNTER — Encounter: Payer: Self-pay | Admitting: Internal Medicine

## 2017-03-10 VITALS — BP 111/81 | HR 76 | Temp 98.3°F | Resp 16 | Wt 255.8 lb

## 2017-03-10 DIAGNOSIS — R05 Cough: Secondary | ICD-10-CM

## 2017-03-10 DIAGNOSIS — R7303 Prediabetes: Secondary | ICD-10-CM | POA: Insufficient documentation

## 2017-03-10 DIAGNOSIS — N951 Menopausal and female climacteric states: Secondary | ICD-10-CM | POA: Insufficient documentation

## 2017-03-10 DIAGNOSIS — R0981 Nasal congestion: Secondary | ICD-10-CM | POA: Insufficient documentation

## 2017-03-10 DIAGNOSIS — Z87891 Personal history of nicotine dependence: Secondary | ICD-10-CM | POA: Insufficient documentation

## 2017-03-10 DIAGNOSIS — E785 Hyperlipidemia, unspecified: Secondary | ICD-10-CM | POA: Insufficient documentation

## 2017-03-10 DIAGNOSIS — G4761 Periodic limb movement disorder: Secondary | ICD-10-CM | POA: Insufficient documentation

## 2017-03-10 DIAGNOSIS — F419 Anxiety disorder, unspecified: Secondary | ICD-10-CM | POA: Insufficient documentation

## 2017-03-10 DIAGNOSIS — K219 Gastro-esophageal reflux disease without esophagitis: Secondary | ICD-10-CM | POA: Diagnosis not present

## 2017-03-10 DIAGNOSIS — I10 Essential (primary) hypertension: Secondary | ICD-10-CM

## 2017-03-10 DIAGNOSIS — F329 Major depressive disorder, single episode, unspecified: Secondary | ICD-10-CM | POA: Diagnosis not present

## 2017-03-10 DIAGNOSIS — R0609 Other forms of dyspnea: Secondary | ICD-10-CM | POA: Diagnosis not present

## 2017-03-10 DIAGNOSIS — Z23 Encounter for immunization: Secondary | ICD-10-CM | POA: Diagnosis not present

## 2017-03-10 DIAGNOSIS — T464X5A Adverse effect of angiotensin-converting-enzyme inhibitors, initial encounter: Secondary | ICD-10-CM | POA: Diagnosis not present

## 2017-03-10 MED ORDER — HYDROCHLOROTHIAZIDE 12.5 MG PO TABS: 12.5000 mg | ORAL_TABLET | Freq: Every day | ORAL | 2 refills | Status: DC

## 2017-03-10 MED ORDER — AMLODIPINE BESYLATE 5 MG PO TABS: 5.0000 mg | ORAL_TABLET | Freq: Every day | ORAL | 3 refills | Status: DC

## 2017-03-10 NOTE — Progress Notes (Signed)
Patient ID: Whitney White, female    DOB: 11-23-1975  MRN: [id number]  CC: Nasal Congestion; Hot Flashes; and Hypertension   Subjective: Whitney White is a 41 y.o. female who presents for chronic ds management. Her concerns today include:  Patient with history of hypertension,anxiety/depressionr, GERD, DOE, pre-DM, mild HL and obesity.  1. Obesity: -loss job. Doing some emotional eating due to stress of trying to find another job -"I did not completely go back to my old habits." Still eating lots of fruits and vegetables, wheat bread.  -still trying to move more. Walking around the part, parking further away from entrances  2. Sleep study did not reveal OSA. +mild periodic limb movement. No urge to move leg when relaxing or falling asleep -does feel refresh in a.m when she wakes up  3. HTN: compliant  With lisinopril/HCTZ  4. Anxiety/depression: she did not inc to 20 mg as discussed on last visit.  She continue the 10 mg. Doing good and found new job just recently  5.  She has notice feeling of  phlegm in throat and feeling of needing to cough and clear throat x 2 mths. -No drainage Cough occurs at any time. No associated shortness of breath or wheezing -no rhinorrhea or itchy throat.  -Using Mucinex 2 months without relief  Patient Active Problem List   Diagnosis Date Noted  . RLS (restless legs syndrome) 01/26/2017  . Mild diastolic dysfunction 12/06/2016  . Prediabetes 11/26/2016  . Hyperlipidemia 11/26/2016  . Anxiety state, unspecified 12/22/2013  . Obesity, unspecified 12/22/2013  . Chest pain 12/17/2013  . Depression   . Hypertension   . Eczema   . Gestational hypertension   . GERD (gastroesophageal reflux disease)      Current Outpatient Prescriptions on File Prior to Visit  Medication Sig Dispense Refill  . escitalopram (LEXAPRO) 20 MG tablet Take 1 tablet (20 mg total) by mouth daily. 30 tablet 3  . hydrOXYzine (ATARAX/VISTARIL) 25 MG tablet Take 1 tablet  (25 mg total) by mouth daily as needed for anxiety. (Patient not taking: Reported on 03/10/2017) 30 tablet 1  . levonorgestrel (MIRENA) 20 MCG/24HR IUD 1 each by Intrauterine route once.     No current facility-administered medications on file prior to visit.     Allergies  Allergen Reactions  . Metformin And Related     Caused palpitations/heart pounding    Social History   Social History  . Marital status: Divorced    Spouse name: N/A  . Number of children: N/A  . Years of education: N/A   Occupational History  . Not on file.   Social History Main Topics  . Smoking status: Former Games developer  . Smokeless tobacco: Never Used  . Alcohol use No  . Drug use: No  . Sexual activity: Not on file     Comment: merania removed   Other Topics Concern  . Not on file   Social History Narrative  . No narrative on file    Family History  Problem Relation Age of Onset  . Hypertension Mother   . Stroke Mother   . Diabetes Maternal Grandmother   . Hypertension Maternal Grandmother   . Cancer Maternal Aunt        breast cancer   . Anesthesia problems Neg Hx     Past Surgical History:  Procedure Laterality Date  . NO PAST SURGERIES      ROS: Review of Systems Negative except as stated above PHYSICAL EXAM: BP  111/81   Pulse 76   Temp 98.3 F (36.8 C) (Oral)   Resp 16   Wt 255 lb 12.8 oz (116 kg)   SpO2 98%   BMI 46.79 kg/m   Wt Readings from Last 3 Encounters:  03/10/17 255 lb 12.8 oz (116 kg)  01/20/17 255 lb (115.7 kg)  12/06/16 254 lb 9.6 oz (115.5 kg)   Physical Exam General appearance - alert, well appearing, obese female and in no distress Mental status - alert, oriented to person, place, and time, normal mood, behavior, speech, dress, motor activity, and thought processes Nose - normal and patent, no erythema, discharge or polyps Mouth - mucous membranes moist, pharynx normal without lesions Neck - supple, no significant adenopathy Chest - clear to  auscultation, no wheezes, rales or rhonchi, symmetric air entry Heart - normal rate, regular rhythm, normal S1, S2, no murmurs, rubs, clicks or gallops Extremities - peripheral pulses normal, no pedal edema, no clubbing or cyanosis   Depression screen [facility] 2/9 03/10/2017 12/06/2016 11/26/2016 11/15/2016 10/30/2016  Decreased Interest 0 0 0 1 1  Down, Depressed, Hopeless 0 PHQ - 2 Score 0 Altered sleeping - 0 0 0 0  Tired, decreased energy - Change in appetite - 0 0 3 3  Feeling bad or failure about yourself  - Trouble concentrating - 0 0 0 0  Moving slowly or fidgety/restless - 0 0 0 0  Suicidal thoughts - 0 0 0 0  PHQ-9 Score - ASSESSMENT AND PLAN: 1. Essential hypertension -At goal. -Change lisinopril/HCTZ to Norvasc/HCTZ due to cough likely caused by Ace-I Nurse only BP check in 2 weeks - amLODipine (NORVASC) 5 MG tablet; Take 1 tablet (5 mg total) by mouth daily.  Dispense: 90 tablet; Refill: 3 - hydrochlorothiazide (HYDRODIURIL) 12.5 MG tablet; Take 1 tablet (12.5 mg total) by mouth daily.  Dispense: 90 tablet; Refill: 2  2. Morbid obesity due to excess calories (HCC) -she has not had any major wgh gain during her emotional eating. She is now back on track and still exercising. Keep up good works  3. Cough due to ACE inhibitor See #1  Patient to let me know if symptoms does not resolve with measurements taken  4. Anxiety disorder, unspecified type Controlled on Lexapro 10 mg instead of 20 mg  5. Periodic limb movement -Not a major issue for her so will not try her with Requip. -Check CBC for anemia  6. Need for influenza vaccination - Flu Vaccine QUAD 6+ mos PF IM (Fluarix Quad PF)   Patient was given the opportunity to ask questions.  Patient verbalized understanding of the plan and was able to repeat key elements of the plan.   Orders Placed This Encounter  Procedures  . Flu Vaccine QUAD 6+ mos PF IM (Fluarix Quad PF)      Requested Prescriptions   Signed Prescriptions Disp Refills  . amLODipine (NORVASC) 5 MG tablet 90 tablet 3    Sig: Take 1 tablet (5 mg total) by mouth daily.  . hydrochlorothiazide (HYDRODIURIL) 12.5 MG tablet 90 tablet 2    Sig: Take 1 tablet (12.5 mg total) by mouth daily.    Return in about 3 months (around 06/10/2017).  Jonah Apsey, MD, FACP

## 2017-03-10 NOTE — Patient Instructions (Signed)
Nurse only BP check in 2 weeks Stop Lisinopril/HCTZ. Start Norvasc and HCTZ instead.  Please rreturn to lab to have blood count done

## 2017-04-19 ENCOUNTER — Encounter: Payer: Self-pay | Admitting: Internal Medicine

## 2017-04-20 ENCOUNTER — Other Ambulatory Visit: Payer: Self-pay | Admitting: Internal Medicine

## 2017-04-20 DIAGNOSIS — F419 Anxiety disorder, unspecified: Secondary | ICD-10-CM

## 2017-04-20 MED ORDER — ESCITALOPRAM OXALATE 10 MG PO TABS: 10.0000 mg | ORAL_TABLET | Freq: Every day | ORAL | 6 refills | Status: DC

## 2017-07-30 ENCOUNTER — Other Ambulatory Visit: Payer: Self-pay

## 2017-07-30 ENCOUNTER — Ambulatory Visit (HOSPITAL_COMMUNITY)
Admission: EM | Admit: 2017-07-30 | Discharge: 2017-07-30 | Disposition: A | Discharge status: Home / Self Care | Payer: 59 | Attending: Family Medicine | Admitting: Family Medicine

## 2017-07-30 ENCOUNTER — Encounter (HOSPITAL_COMMUNITY): Payer: Self-pay | Admitting: Emergency Medicine

## 2017-07-30 DIAGNOSIS — R69 Illness, unspecified: Secondary | ICD-10-CM

## 2017-07-30 DIAGNOSIS — J111 Influenza due to unidentified influenza virus with other respiratory manifestations: Secondary | ICD-10-CM

## 2017-07-30 MED ORDER — HYDROCODONE-HOMATROPINE 5-1.5 MG/5ML PO SYRP: 5.0000 mL | ORAL_SOLUTION | Freq: Four times a day (QID) | ORAL | 0 refills | Status: DC | PRN

## 2017-07-30 NOTE — Discharge Instructions (Signed)

## 2017-07-30 NOTE — ED Triage Notes (Addendum)
Onset Sunday of scratchy throat, chills, cough, thick phlegm, headache and body aches.  Patient has noticed sob with this illness.

## 2017-07-30 NOTE — ED Provider Notes (Signed)
[facility]   [id number] 07/30/17 Arrival Time: 1028  ASSESSMENT & PLAN:  1. Influenza-like illness     Meds ordered this encounter  Medications  . HYDROcodone-homatropine (HYCODAN) 5-1.5 MG/5ML syrup    Sig: Take 5 mLs by mouth every 6 (six) hours as needed for cough.    Dispense:  90 mL    Refill:  0   Work note given. Cough medication sedation precautions. Discussed typical duration of symptoms. OTC symptom care as needed. Ensure adequate fluid intake and rest. May f/u with PCP or here as needed.  Reviewed expectations re: course of current medical issues. Questions answered. Outlined signs and symptoms indicating need for more acute intervention. Patient verbalized understanding. After Visit Summary given.   SUBJECTIVE: History from: patient.  Whitney White is a 42 y.o. female who presents with complaint of nasal congestion, post-nasal drainage, and a persistent dry cough. Onset abrupt, approximately 4 days ago. Overall fatigued with body aches. SOB: questions with coughing fits. Wheezing: none. Fever: no. Overall normal PO intake without n/v. Sick contacts: no. OTC treatment: cough medicines/Tylenol with mild help.  Received flu shot this year: yes.  Social History   Tobacco Use  Smoking Status Former Smoker  Smokeless Tobacco Never Used    ROS: As per HPI.   OBJECTIVE:  Vitals:   07/30/17 1152  BP: 110/72  Pulse: 93  Resp: (!) 26  Temp: 98.7 F (37.1 C)  TempSrc: Oral  SpO2: 96%     General appearance: alert; appears fatigued HEENT: nasal congestion; clear runny nose; throat irritation secondary to post-nasal drainage Neck: supple without LAD Lungs: unlabored respirations, symmetrical air entry; cough: moderate; no respiratory distress Skin: warm and dry Psychological: alert and cooperative; normal mood and affect  Imaging: No results found.  Allergies  Allergen Reactions  . Lisinopril Cough  . Metformin And Related    Caused palpitations/heart pounding    Past Medical History:  Diagnosis Date  . Depression   . Diabetes mellitus without complication (HCC)    "pre-diabetic"  . Eczema   . GERD (gastroesophageal reflux disease)   . Gestational hypertension   . Hypertension    Family History  Problem Relation Age of Onset  . Hypertension Mother   . Stroke Mother   . Diabetes Maternal Grandmother   . Hypertension Maternal Grandmother   . Cancer Maternal Aunt        breast cancer   . Anesthesia problems Neg Hx    Social History   Socioeconomic History  . Marital status: Divorced    Spouse name: Not on file  . Number of children: Not on file  . Years of education: Not on file  . Highest education level: Not on file  Social Needs  . Financial resource strain: Not on file  . Food insecurity - worry: Not on file  . Food insecurity - inability: Not on file  . Transportation needs - medical: Not on file  . Transportation needs - non-medical: Not on file  Occupational History  . Not on file  Tobacco Use  . Smoking status: Former Games developermoker  . Smokeless tobacco: Never Used  Substance and Sexual Activity  . Alcohol use: No  . Drug use: No  . Sexual activity: Not on file    Comment: merania removed  Other Topics Concern  . Not on file  Social History Narrative  . Not on file           Mardella LaymanHagler, Jarmar Rousseau, MD 07/30/17 1219

## 2017-10-08 ENCOUNTER — Encounter: Payer: Self-pay | Admitting: Internal Medicine

## 2017-10-08 ENCOUNTER — Encounter

## 2017-10-08 ENCOUNTER — Ambulatory Visit: Payer: 59 | Attending: Internal Medicine | Admitting: Internal Medicine

## 2017-10-08 DIAGNOSIS — Z8249 Family history of ischemic heart disease and other diseases of the circulatory system: Secondary | ICD-10-CM | POA: Diagnosis not present

## 2017-10-08 DIAGNOSIS — G2581 Restless legs syndrome: Secondary | ICD-10-CM | POA: Insufficient documentation

## 2017-10-08 DIAGNOSIS — E785 Hyperlipidemia, unspecified: Secondary | ICD-10-CM | POA: Diagnosis not present

## 2017-10-08 DIAGNOSIS — Z87891 Personal history of nicotine dependence: Secondary | ICD-10-CM | POA: Insufficient documentation

## 2017-10-08 DIAGNOSIS — D649 Anemia, unspecified: Secondary | ICD-10-CM

## 2017-10-08 DIAGNOSIS — I10 Essential (primary) hypertension: Secondary | ICD-10-CM | POA: Diagnosis not present

## 2017-10-08 DIAGNOSIS — Z888 Allergy status to other drugs, medicaments and biological substances status: Secondary | ICD-10-CM | POA: Insufficient documentation

## 2017-10-08 DIAGNOSIS — Z79899 Other long term (current) drug therapy: Secondary | ICD-10-CM | POA: Insufficient documentation

## 2017-10-08 DIAGNOSIS — Z823 Family history of stroke: Secondary | ICD-10-CM | POA: Diagnosis not present

## 2017-10-08 DIAGNOSIS — R7303 Prediabetes: Secondary | ICD-10-CM | POA: Diagnosis not present

## 2017-10-08 DIAGNOSIS — G4761 Periodic limb movement disorder: Secondary | ICD-10-CM | POA: Diagnosis not present

## 2017-10-08 DIAGNOSIS — M79673 Pain in unspecified foot: Secondary | ICD-10-CM | POA: Diagnosis present

## 2017-10-08 DIAGNOSIS — R0609 Other forms of dyspnea: Secondary | ICD-10-CM | POA: Insufficient documentation

## 2017-10-08 DIAGNOSIS — Z975 Presence of (intrauterine) contraceptive device: Secondary | ICD-10-CM | POA: Insufficient documentation

## 2017-10-08 DIAGNOSIS — F411 Generalized anxiety disorder: Secondary | ICD-10-CM | POA: Diagnosis not present

## 2017-10-08 DIAGNOSIS — F329 Major depressive disorder, single episode, unspecified: Secondary | ICD-10-CM | POA: Diagnosis not present

## 2017-10-08 DIAGNOSIS — R600 Localized edema: Secondary | ICD-10-CM | POA: Insufficient documentation

## 2017-10-08 DIAGNOSIS — K219 Gastro-esophageal reflux disease without esophagitis: Secondary | ICD-10-CM | POA: Diagnosis not present

## 2017-10-08 DIAGNOSIS — Z6841 Body Mass Index (BMI) 40.0 and over, adult: Secondary | ICD-10-CM | POA: Insufficient documentation

## 2017-10-08 DIAGNOSIS — R0600 Dyspnea, unspecified: Secondary | ICD-10-CM

## 2017-10-08 MED ORDER — HYDROCHLOROTHIAZIDE 25 MG PO TABS: 25.0000 mg | ORAL_TABLET | Freq: Every day | ORAL | 5 refills | Status: DC

## 2017-10-08 NOTE — Patient Instructions (Addendum)
Increase Hydrochlorothiazide to 25 mg daily.  Decrease salt intake.    Follow a Healthy Eating Plan - You can do it! Limit sugary drinks.  Avoid sodas, sweet tea, sport or energy drinks, or fruit drinks.  Drink water, lo-fat milk, or diet drinks. Limit snack foods.   Cut back on candy, cake, cookies, chips, ice cream.  These are a special treat, only in small amounts. Eat plenty of vegetables.  Especially dark green, red, and orange vegetables. Aim for at least 3 servings a day. More is better! Include fruit in your daily diet.  Whole fruit is much healthier than fruit juice! Limit "white" bread, "white" pasta, "white" rice.   Choose "100% whole grain" products, brown or wild rice. Avoid fatty meats. Try "Meatless Monday" and choose eggs or beans one day a week.  When eating meat, choose lean meats like chicken, [place], and fish.  Grill, broil, or bake meats instead of frying, and eat poultry without the skin. Eat less salt.  Avoid frozen pizzas, frozen dinners and salty foods.  Use seasonings other than salt in cooking.  This can help blood pressure and keep you from swelling Beer, wine and liquor have calories.  If you can safely drink alcohol, limit to 1 drink per day for women, 2 drinks for men

## 2017-10-08 NOTE — Progress Notes (Signed)
Patient ID: Whitney White, female    DOB: 1976-02-18  MRN: [id number]  CC: Foot Pain   Subjective: Whitney White is a 42 y.o. female who presents for chronic ds management.  Her concerns today include:  Patient with history of hypertension,anxiety/depressionr, GERD, DOE, pre-DM, mild HLand obesity.  C/o swelling on dorsum of feet for 2-3 wks. Worse in a.m and if she is not wearing shoes C/o increase SOB over the past mth.  Can only stand in the shower for 5-6 mins, SOB in walking from bed to bathroom. -no SOB at rest.  Usually sleeps on 4-5 pillows.  This is not new -no cough over the past 1 mth. Endorses wheezing "when I'm moving around."  Gain 44 lbs since last visit in 03/2017.  Sedentary job.  And sedentary lifestyle.  She has the Mirena IUD.  No pain or swelling in the legs.  "I eat what's available to me." -reports compliance with meds.  Had echo last yr that revealed NL EF with mild diastolic dysfunction Patient Active Problem List   Diagnosis Date Noted  . Periodic limb movement 03/10/2017  . RLS (restless legs syndrome) 01/26/2017  . Mild diastolic dysfunction 12/06/2016  . Prediabetes 11/26/2016  . Hyperlipidemia 11/26/2016  . Anxiety state, unspecified 12/22/2013  . Obesity, unspecified 12/22/2013  . Chest pain 12/17/2013  . Depression   . Hypertension   . Eczema   . Gestational hypertension   . GERD (gastroesophageal reflux disease)      Current Outpatient Medications on File Prior to Visit  Medication Sig Dispense Refill  . amLODipine (NORVASC) 5 MG tablet Take 1 tablet (5 mg total) by mouth daily. 90 tablet 3  . escitalopram (LEXAPRO) 10 MG tablet Take 1 tablet (10 mg total) daily by mouth. 30 tablet 6  . HYDROcodone-homatropine (HYCODAN) 5-1.5 MG/5ML syrup Take 5 mLs by mouth every 6 (six) hours as needed for cough. (Patient not taking: Reported on 10/08/2017) 90 mL 0  . levonorgestrel (MIRENA) 20 MCG/24HR IUD 1 each by Intrauterine route once.     No  current facility-administered medications on file prior to visit.     Allergies  Allergen Reactions  . Lisinopril Cough  . Metformin And Related     Caused palpitations/heart pounding    Social History   Socioeconomic History  . Marital status: Divorced    Spouse name: Not on file  . Number of children: Not on file  . Years of education: Not on file  . Highest education level: Not on file  Occupational History  . Not on file  Social Needs  . Financial resource strain: Not on file  . Food insecurity:    Worry: Not on file    Inability: Not on file  . Transportation needs:    Medical: Not on file    Non-medical: Not on file  Tobacco Use  . Smoking status: Former Games developer  . Smokeless tobacco: Never Used  Substance and Sexual Activity  . Alcohol use: No  . Drug use: No  . Sexual activity: Not on file    Comment: merania removed  Lifestyle  . Physical activity:    Days per week: Not on file    Minutes per session: Not on file  . Stress: Not on file  Relationships  . Social connections:    Talks on phone: Not on file    Gets together: Not on file    Attends religious service: Not on file    Active  member of club or organization: Not on file    Attends meetings of clubs or organizations: Not on file    Relationship status: Not on file  . Intimate partner violence:    Fear of current or ex partner: Not on file    Emotionally abused: Not on file    Physically abused: Not on file    Forced sexual activity: Not on file  Other Topics Concern  . Not on file  Social History Narrative  . Not on file    Family History  Problem Relation Age of Onset  . Hypertension Mother   . Stroke Mother   . Diabetes Maternal Grandmother   . Hypertension Maternal Grandmother   . Cancer Maternal Aunt        breast cancer   . Anesthesia problems Neg Hx     Past Surgical History:  Procedure Laterality Date  . NO PAST SURGERIES      ROS: Review of Systems As stated  above PHYSICAL EXAM: BP 120/78   Pulse 90   Temp 98.3 F (36.8 C) (Oral)   Resp 16   Wt 298 lb (135.2 kg)   SpO2 94%   BMI 54.50 kg/m   Wt Readings from Last 3 Encounters:  10/08/17 298 lb (135.2 kg)  03/10/17 255 lb 12.8 oz (116 kg)  01/20/17 255 lb (115.7 kg)    Physical Exam General appearance - alert, well appearing, obese femaleand in no distress Mental status - normal mood, behavior, speech, dress, motor activity, and thought processes Neck - supple, no significant adenopathy Chest - clear to auscultation, no wheezes, rales or rhonchi, symmetric air entry Heart - normal rate, regular rhythm, normal S1, S2, no murmurs, rubs, clicks or gallops. No JVD Abdomen - soft, nontender, nondistended, no masses or organomegaly Extremities - -trace LE and 1+ pedal edema  ASSESSMENT AND PLAN: 1. Dyspnea on exertion -Differential diagnoses include deconditioning (given 40+ pound weight gain over the past 6 months), progression of diastolic dysfunction, asthma, and PE.  Will order some baseline labs including BNP and a d-dimer.  Refer for pulmonary function tests -Counseled extensively on the need to lose weight - CBC - Comprehensive metabolic panel - Pulmonary function test; Future - Brain natriuretic peptide - D-dimer, quantitative (not at [facility])  2. Pedal edema - TSH  3. Essential hypertension At goal.  However given the pedal edema we will increase HCTZ to 25 mg daily. - Lipid panel - hydrochlorothiazide (HYDRODIURIL) 25 MG tablet; Take 1 tablet (25 mg total) by mouth daily.  Dispense: 30 tablet; Refill: 5  4. Morbid obesity due to excess calories (HCC) Counseled on the importance of healthy eating habits and regular exercise.  She is agreeable to seeing a nutritionist.  If baseline labs drawn today are okay we will discuss starting a start low on go slow exercise program on follow-up visit in 2 weeks - Hemoglobin A1c - Amb ref to Medical Nutrition Therapy-MNT  Patient was  given the opportunity to ask questions.  Patient verbalized understanding of the plan and was able to repeat key elements of the plan.   Orders Placed This Encounter  Procedures  . CBC  . Comprehensive metabolic panel  . TSH  . Lipid panel  . Hemoglobin A1c  . Brain natriuretic peptide  . D-dimer, quantitative (not at [facility])  . Amb ref to Medical Nutrition Therapy-MNT  . Pulmonary function test     Requested Prescriptions   Signed Prescriptions Disp Refills  . hydrochlorothiazide (  HYDRODIURIL) 25 MG tablet 30 tablet 5    Sig: Take 1 tablet (25 mg total) by mouth daily.    Return in about 2 weeks (around 10/22/2017).  Jonah Routson, MD, FACP

## 2017-10-09 ENCOUNTER — Encounter: Payer: Self-pay | Admitting: Obstetrics & Gynecology

## 2017-10-09 ENCOUNTER — Telehealth: Payer: Self-pay | Admitting: Internal Medicine

## 2017-10-09 LAB — LIPID PANEL
CHOL/HDL RATIO: 4.5 ratio — AB (ref 0.0–4.4)
Cholesterol, Total: 177 mg/dL (ref 100–199)
HDL: 39 mg/dL — AB (ref >39)
LDL Calculated: 117 mg/dL — ABNORMAL HIGH (ref 0–99)
Triglycerides: 107 mg/dL (ref 0–149)
VLDL CHOLESTEROL CAL: 21 mg/dL (ref 5–40)

## 2017-10-09 LAB — COMPREHENSIVE METABOLIC PANEL
ALBUMIN: 4.3 g/dL (ref 3.5–5.5)
ALT: 15 IU/L (ref 0–32)
AST: 16 IU/L (ref 0–40)
Albumin/Globulin Ratio: 1.5 (ref 1.2–2.2)
Alkaline Phosphatase: 53 IU/L (ref 39–117)
BUN / CREAT RATIO: 14 (ref 9–23)
BUN: 12 mg/dL (ref 6–20)
CALCIUM: 9.2 mg/dL (ref 8.7–10.2)
CHLORIDE: 100 mmol/L (ref 96–106)
CO2: 25 mmol/L (ref 20–29)
CREATININE: 0.85 mg/dL (ref 0.57–1.00)
GFR calc non Af Amer: 87 mL/min/{1.73_m2} (ref >59)
GFR, EST AFRICAN AMERICAN: 100 mL/min/{1.73_m2} (ref >59)
GLUCOSE: 95 mg/dL (ref 65–99)
Globulin, Total: 2.9 g/dL (ref 1.5–4.5)
Potassium: 4.4 mmol/L (ref 3.5–5.2)
Sodium: 142 mmol/L (ref 134–144)
TOTAL PROTEIN: 7.2 g/dL (ref 6.0–8.5)

## 2017-10-09 LAB — HEMOGLOBIN A1C
ESTIMATED AVERAGE GLUCOSE: 148 mg/dL
HEMOGLOBIN A1C: 6.8 % — AB (ref 4.8–5.6)

## 2017-10-09 LAB — CBC
HEMOGLOBIN: 12.2 g/dL (ref 11.1–15.9)
Hematocrit: 39.2 % (ref 34.0–46.6)
MCH: 24.5 pg — ABNORMAL LOW (ref 26.6–33.0)
MCHC: 31.1 g/dL — ABNORMAL LOW (ref 31.5–35.7)
MCV: 79 fL (ref 79–97)
Platelets: 418 10*3/uL — ABNORMAL HIGH (ref 150–379)
RBC: 4.98 x10E6/uL (ref 3.77–5.28)
RDW: 16 % — ABNORMAL HIGH (ref 12.3–15.4)
WBC: 8.8 10*3/uL (ref 3.4–10.8)

## 2017-10-09 LAB — BRAIN NATRIURETIC PEPTIDE: BNP: 11.9 pg/mL (ref 0.0–100.0)

## 2017-10-09 LAB — TSH: TSH: 0.968 u[IU]/mL (ref 0.450–4.500)

## 2017-10-09 NOTE — Addendum Note (Signed)
Addended by: Jonah Boom B on: 10/09/2017 08:15 AM   Modules accepted: Orders

## 2017-10-09 NOTE — Telephone Encounter (Signed)
Phone call placed to patient this a.m. to go over lab results.  Patient informed that she is in the range for diabetes and cholesterol is elevated.  We will discuss management of both on her follow-up visit with me in 2 weeks.  Hemoglobin is in the low normal range.  Looks like she is heading towards iron deficiency anemia.  I will have the lab add iron studies.  The lab did not have enough blood to run the d-dimer.  Patient is willing to come back to the lab tomorrow morning to have this drawn.  If positive we will do VQ scan or CTA to evaluate for pulmonary embolism.  Patient expressed understanding.Whitney White

## 2017-10-10 ENCOUNTER — Telehealth: Payer: Self-pay | Admitting: Internal Medicine

## 2017-10-10 ENCOUNTER — Ambulatory Visit: Payer: 59 | Attending: Internal Medicine

## 2017-10-10 DIAGNOSIS — D649 Anemia, unspecified: Secondary | ICD-10-CM | POA: Insufficient documentation

## 2017-10-10 NOTE — Progress Notes (Signed)
Patient here for lab visit only 

## 2017-10-10 NOTE — Telephone Encounter (Signed)
Called to schedule patient an apointment like doctor Waldron asked. The patient didn't answer so I LVM for the patient to call back.

## 2017-10-10 NOTE — Telephone Encounter (Signed)
-----   Message from Marcine Matar, MD sent at 10/10/2017  7:38 AM EDT ----- Please give this patient follow-up appointment with me in about 2 weeks.

## 2017-10-11 LAB — IRON,TIBC AND FERRITIN PANEL
FERRITIN: 201 ng/mL — AB (ref 15–150)
Iron Saturation: 24 % (ref 15–55)
Iron: 77 ug/dL (ref 27–159)
Total Iron Binding Capacity: 326 ug/dL (ref 250–450)
UIBC: 249 ug/dL (ref 131–425)

## 2017-10-11 LAB — D-DIMER, QUANTITATIVE: D-DIMER: 0.61 mg/L FEU — ABNORMAL HIGH (ref 0.00–0.49)

## 2017-10-13 ENCOUNTER — Other Ambulatory Visit: Payer: Self-pay | Admitting: Internal Medicine

## 2017-10-13 DIAGNOSIS — R0600 Dyspnea, unspecified: Secondary | ICD-10-CM

## 2017-10-13 DIAGNOSIS — R0609 Other forms of dyspnea: Principal | ICD-10-CM

## 2017-10-15 ENCOUNTER — Telehealth: Payer: Self-pay | Admitting: Internal Medicine

## 2017-10-15 NOTE — Telephone Encounter (Addendum)
Whitney White called to request pt's Ct scan authorization be completed as soon as possible.she stated it has to be done by today at 3pm. Please follow up(Kendra was informed Pt's Nurse is not in clinic today) [phone]

## 2017-10-16 ENCOUNTER — Telehealth: Payer: Self-pay | Admitting: Internal Medicine

## 2017-10-16 ENCOUNTER — Ambulatory Visit (HOSPITAL_COMMUNITY)
Admission: RE | Admit: 2017-10-16 | Discharge: 2017-10-16 | Disposition: A | Discharge status: Home / Self Care | Payer: 59 | Source: Ambulatory Visit | Attending: Internal Medicine | Admitting: Internal Medicine

## 2017-10-16 DIAGNOSIS — R0609 Other forms of dyspnea: Secondary | ICD-10-CM | POA: Diagnosis present

## 2017-10-16 DIAGNOSIS — R0600 Dyspnea, unspecified: Secondary | ICD-10-CM

## 2017-10-16 MED ORDER — IOPAMIDOL (ISOVUE-370) INJECTION 76%
100.0000 mL | Freq: Once | INTRAVENOUS | Status: AC | PRN
  Administered 2017-10-16: 100 mL via INTRAVENOUS

## 2017-10-16 MED ORDER — IOPAMIDOL (ISOVUE-370) INJECTION 76%
INTRAVENOUS | Status: DC
Start: 2017-10-16 — End: 2017-10-17
  Filled 2017-10-16: qty 100

## 2017-10-16 NOTE — Telephone Encounter (Signed)
Whitney White started prior [place]

## 2017-10-16 NOTE — Telephone Encounter (Signed)
Whitney White called needing a prior aurt done before 3 pm because the appointment is today at three.

## 2017-10-22 ENCOUNTER — Telehealth: Payer: Self-pay

## 2017-10-22 ENCOUNTER — Ambulatory Visit: Payer: 59 | Attending: Internal Medicine | Admitting: *Deleted

## 2017-10-22 VITALS — BP 117/75 | HR 86

## 2017-10-22 DIAGNOSIS — I10 Essential (primary) hypertension: Secondary | ICD-10-CM

## 2017-10-22 NOTE — Progress Notes (Signed)
Mrs. Whitney White came in the office today for blood pressure check. She verbalizes that she takes all medications that are on the medication list. Denies swelling, SOB, chest pain, or headaches.  Blood pressure is noted to be 117/75 today.   Pt has concerns about last Hemoglobin A1C result. She is unclear of what measures to take since last office visit. Concerned if she needs to take medication at this point.  Will route chart to  Dr. Laural Benes to address needed measures pertaining to diabetes.

## 2017-10-22 NOTE — Telephone Encounter (Signed)
I called [employer] and they informed me that the nurse that did the prior auth for this pt was denied and it was denied 10/21/2017. The prior auth was started the day of the procedure. I asked what cpt code they had and they informed me 16109. I informed them that it was the wrong cpt code. I had provided them the correct dx code and they informed me that I had to do a new prior auth for the correct cpt code. I did the new prior auth and it was approved but unfortunately they stated that they can not back date it. They only way it can be paid is if the claim is appealed and the bill has not been paid by the patient.   New prior Auth with correct code  Contacted [employer] and spoke to yolanda G  intake representative    CPT- [phone] CT angio chest w or wo contrast ICD- R06.09 dyspnea on exertion          R06.02 sob          I26.99 PE          R79.1 positive D-dimer    Case # [id number]   Gave all clinical information and additonal information was needed    Asked to speak with the clinical reviewer and spoke with amy T gave additonal clinical information    Authorization # [id number] Effective 10-22-17 Expires 12-06-2017

## 2017-10-23 ENCOUNTER — Ambulatory Visit (HOSPITAL_COMMUNITY)
Admission: RE | Admit: 2017-10-23 | Discharge: 2017-10-23 | Disposition: A | Discharge status: Home / Self Care | Payer: 59 | Source: Ambulatory Visit | Attending: Internal Medicine | Admitting: Internal Medicine

## 2017-10-23 DIAGNOSIS — R0609 Other forms of dyspnea: Secondary | ICD-10-CM | POA: Diagnosis present

## 2017-10-23 DIAGNOSIS — R0600 Dyspnea, unspecified: Secondary | ICD-10-CM

## 2017-10-23 LAB — PULMONARY FUNCTION TEST
DL/VA % pred: 133 %
DL/VA: 6.04 ml/min/mmHg/L
DLCO cor % pred: 72 %
DLCO cor: 15.7 ml/min/mmHg
DLCO unc % pred: 69 %
DLCO unc: 15.09 ml/min/mmHg
FEF 25-75 Post: 2.09 L/sec
FEF 25-75 Pre: 2.32 L/sec
FEF2575-%Change-Post: -10 %
FEF2575-%Pred-Post: 77 %
FEF2575-%Pred-Pre: 85 %
FEV1-%Change-Post: 1 %
FEV1-%Pred-Post: 66 %
FEV1-%Pred-Pre: 65 %
FEV1-Post: 1.57 L
FEV1-Pre: 1.56 L
FEV1FVC-%Change-Post: 0 %
FEV1FVC-%Pred-Pre: 105 %
FEV6-%Change-Post: 2 %
FEV6-%Pred-Post: 63 %
FEV6-%Pred-Pre: 61 %
FEV6-Post: 1.77 L
FEV6-Pre: 1.73 L
FEV6FVC-%Pred-Post: 102 %
FEV6FVC-%Pred-Pre: 102 %
FVC-%Change-Post: 1 %
FVC-%Pred-Post: 62 %
FVC-%Pred-Pre: 61 %
FVC-Post: 1.78 L
FVC-Pre: 1.75 L
Post FEV1/FVC ratio: 88 %
Post FEV6/FVC ratio: 100 %
Pre FEV1/FVC ratio: 89 %
Pre FEV6/FVC Ratio: 100 %
RV % pred: 103 %
RV: 1.53 L
TLC % pred: 77 %
TLC: 3.67 L

## 2017-10-23 MED ORDER — ALBUTEROL SULFATE (2.5 MG/3ML) 0.083% IN NEBU
2.5000 mg | INHALATION_SOLUTION | Freq: Once | RESPIRATORY_TRACT | Status: AC
  Administered 2017-10-23: 2.5 mg via RESPIRATORY_TRACT

## 2017-11-14 ENCOUNTER — Ambulatory Visit: Payer: 59 | Attending: Internal Medicine | Admitting: Internal Medicine

## 2017-11-14 ENCOUNTER — Encounter: Payer: Self-pay | Admitting: Internal Medicine

## 2017-11-14 ENCOUNTER — Other Ambulatory Visit: Payer: Self-pay

## 2017-11-14 VITALS — BP 120/82 | HR 90 | Temp 98.5°F | Resp 20 | Wt 296.2 lb

## 2017-11-14 DIAGNOSIS — Z8249 Family history of ischemic heart disease and other diseases of the circulatory system: Secondary | ICD-10-CM | POA: Insufficient documentation

## 2017-11-14 DIAGNOSIS — Z79899 Other long term (current) drug therapy: Secondary | ICD-10-CM | POA: Insufficient documentation

## 2017-11-14 DIAGNOSIS — Z6841 Body Mass Index (BMI) 40.0 and over, adult: Secondary | ICD-10-CM | POA: Diagnosis not present

## 2017-11-14 DIAGNOSIS — Z87891 Personal history of nicotine dependence: Secondary | ICD-10-CM | POA: Insufficient documentation

## 2017-11-14 DIAGNOSIS — E785 Hyperlipidemia, unspecified: Secondary | ICD-10-CM | POA: Diagnosis not present

## 2017-11-14 DIAGNOSIS — E119 Type 2 diabetes mellitus without complications: Secondary | ICD-10-CM | POA: Insufficient documentation

## 2017-11-14 DIAGNOSIS — K219 Gastro-esophageal reflux disease without esophagitis: Secondary | ICD-10-CM | POA: Insufficient documentation

## 2017-11-14 DIAGNOSIS — F329 Major depressive disorder, single episode, unspecified: Secondary | ICD-10-CM | POA: Diagnosis not present

## 2017-11-14 DIAGNOSIS — I10 Essential (primary) hypertension: Secondary | ICD-10-CM | POA: Diagnosis not present

## 2017-11-14 DIAGNOSIS — Z888 Allergy status to other drugs, medicaments and biological substances status: Secondary | ICD-10-CM | POA: Diagnosis not present

## 2017-11-14 DIAGNOSIS — R0609 Other forms of dyspnea: Secondary | ICD-10-CM | POA: Insufficient documentation

## 2017-11-14 DIAGNOSIS — F411 Generalized anxiety disorder: Secondary | ICD-10-CM | POA: Insufficient documentation

## 2017-11-14 MED ORDER — TRUE METRIX METER W/DEVICE KIT: PACK | 0 refills | Status: DC

## 2017-11-14 MED ORDER — TRUEPLUS LANCETS 28G MISC: 5 refills | Status: DC

## 2017-11-14 MED ORDER — GLUCOSE BLOOD VI STRP: ORAL_STRIP | 12 refills | Status: DC

## 2017-11-14 NOTE — Patient Instructions (Signed)
Follow a Healthy Eating Plan - You can do it! Limit sugary drinks.  Avoid sodas, sweet tea, sport or energy drinks, or fruit drinks.  Drink water, lo-fat milk, or diet drinks. Limit snack foods.   Cut back on candy, cake, cookies, chips, ice cream.  These are a special treat, only in small amounts. Eat plenty of vegetables.  Especially dark green, red, and orange vegetables. Aim for at least 3 servings a day. More is better! Include fruit in your daily diet.  Whole fruit is much healthier than fruit juice! Limit "white" bread, "white" pasta, "white" rice.   Choose "100% whole grain" products, brown or wild rice. Avoid fatty meats. Try "Meatless Monday" and choose eggs or beans one day a week.  When eating meat, choose lean meats like chicken, [place], and fish.  Grill, broil, or bake meats instead of frying, and eat poultry without the skin. Eat less salt.  Avoid frozen pizzas, frozen dinners and salty foods.  Use seasonings other than salt in cooking.  This can help blood pressure and keep you from swelling Beer, wine and liquor have calories.  If you can safely drink alcohol, limit to 1 drink per day for women, 2 drinks for men   Check blood sugars some times before meals and sometimes 2 hrs after.  Goal before meals in 90-130.  After meals is less than 180.

## 2017-11-14 NOTE — Progress Notes (Signed)
Noticed improvement with SOB and activity.

## 2017-11-14 NOTE — Progress Notes (Signed)
Patient ID: Whitney White, female    DOB: 1975/08/20  MRN: [id number]  CC: Follow-up   Subjective: Whitney White is a 42 y.o. female who presents for 6 weeks follow-up. Her concerns today include:  Patient with history of hypertension,anxiety/depressionr, GERD, DOE, pre-DM, mild HLand obesity.   SOB better and LE edema decreased with increase dose of HCTZ -CTA of the chest was negative for PE. PFTs reveals a restrictive pattern which I concluded is likely due to morbid obesity  Obesity:  Has appt with the nutritionist in a few wks Last A1C was 6.8 which is in the range for DM.  She did not tolerate metformin in the past when we try to with it for prediabetes. -she has tried to make some dietary changes on her own since last visit. -trying to move more. Can not afford gym membership right now  Patient Active Problem List   Diagnosis Date Noted  . Periodic limb movement 03/10/2017  . RLS (restless legs syndrome) 01/26/2017  . Mild diastolic dysfunction 48/88/9169  . Prediabetes 11/26/2016  . Hyperlipidemia 11/26/2016  . Anxiety state, unspecified 12/22/2013  . Obesity, unspecified 12/22/2013  . Chest pain 12/17/2013  . Depression   . Hypertension   . Eczema   . Gestational hypertension   . GERD (gastroesophageal reflux disease)      Current Outpatient Medications on File Prior to Visit  Medication Sig Dispense Refill  . amLODipine (NORVASC) 5 MG tablet Take 1 tablet (5 mg total) by mouth daily. 90 tablet 3  . escitalopram (LEXAPRO) 10 MG tablet Take 1 tablet (10 mg total) daily by mouth. 30 tablet 6  . hydrochlorothiazide (HYDRODIURIL) 25 MG tablet Take 1 tablet (25 mg total) by mouth daily. 30 tablet 5  . levonorgestrel (MIRENA) 20 MCG/24HR IUD 1 each by Intrauterine route once.     No current facility-administered medications on file prior to visit.     Allergies  Allergen Reactions  . Lisinopril Cough  . Metformin And Related     Caused palpitations/heart  pounding    Social History   Socioeconomic History  . Marital status: Divorced    Spouse name: Not on file  . Number of children: Not on file  . Years of education: Not on file  . Highest education level: Not on file  Occupational History  . Not on file  Social Needs  . Financial resource strain: Not on file  . Food insecurity:    Worry: Not on file    Inability: Not on file  . Transportation needs:    Medical: Not on file    Non-medical: Not on file  Tobacco Use  . Smoking status: Former Research scientist (life sciences)  . Smokeless tobacco: Never Used  Substance and Sexual Activity  . Alcohol use: No  . Drug use: No  . Sexual activity: Not on file    Comment: merania removed  Lifestyle  . Physical activity:    Days per week: Not on file    Minutes per session: Not on file  . Stress: Not on file  Relationships  . Social connections:    Talks on phone: Not on file    Gets together: Not on file    Attends religious service: Not on file    Active member of club or organization: Not on file    Attends meetings of clubs or organizations: Not on file    Relationship status: Not on file  . Intimate partner violence:    Fear  of current or ex partner: Not on file    Emotionally abused: Not on file    Physically abused: Not on file    Forced sexual activity: Not on file  Other Topics Concern  . Not on file  Social History Narrative  . Not on file    Family History  Problem Relation Age of Onset  . Hypertension Mother   . Stroke Mother   . Diabetes Maternal Grandmother   . Hypertension Maternal Grandmother   . Cancer Maternal Aunt        breast cancer   . Anesthesia problems Neg Hx     Past Surgical History:  Procedure Laterality Date  . NO PAST SURGERIES      ROS: Review of Systems Negative except as stated above PHYSICAL EXAM: BP 120/82   Pulse 90   Temp 98.5 F (36.9 C) (Oral)   Resp 20   Wt 296 lb 3.2 oz (134.4 kg)   LMP 10/01/2016 (Approximate) Comment: on Mirena   SpO2 99%   BMI 54.18 kg/m   Wt Readings from Last 3 Encounters:  11/14/17 296 lb 3.2 oz (134.4 kg)  10/08/17 298 lb (135.2 kg)  03/10/17 255 lb 12.8 oz (116 kg)    Physical Exam BP 120/82 General appearance - alert, well appearing, morbidly obese female and in no distress Mental status - normal mood, behavior, speech, dress, motor activity, and thought processes Chest - clear to auscultation, no wheezes, rales or rhonchi, symmetric air entry Heart - normal rate, regular rhythm, normal S1, S2, no murmurs, rubs, clicks or gallops Extremities - peripheral pulses normal, no pedal edema, no clubbing or cyanosis     Chemistry      Component Value Date/Time   NA 142 10/08/2017 0934   K 4.4 10/08/2017 0934   CL 100 10/08/2017 0934   CO2 25 10/08/2017 0934   BUN 12 10/08/2017 0934   CREATININE 0.85 10/08/2017 0934      Component Value Date/Time   CALCIUM 9.2 10/08/2017 0934   ALKPHOS 53 10/08/2017 0934   AST 16 10/08/2017 0934   ALT 15 10/08/2017 0934   BILITOT <0.2 10/08/2017 0934       Chemistry      Component Value Date/Time   NA 142 10/08/2017 0934   K 4.4 10/08/2017 0934   CL 100 10/08/2017 0934   CO2 25 10/08/2017 0934   BUN 12 10/08/2017 0934   CREATININE 0.85 10/08/2017 0934      Component Value Date/Time   CALCIUM 9.2 10/08/2017 0934   ALKPHOS 53 10/08/2017 0934   AST 16 10/08/2017 0934   ALT 15 10/08/2017 0934   BILITOT <0.2 10/08/2017 0934     Lab Results  Component Value Date   HGBA1C 6.8 (H) 10/08/2017     ASSESSMENT AND PLAN: 1. New onset type 2 diabetes mellitus ([place]) Some dietary counseling done today. Encouraged her to move more.  She does not need a gym membership to do so. Keep appointment with nutritionist. She will try to check blood sugars at least once a day and bring in readings on follow-up visit in 6 weeks.  We will decide at that time whether to start medication - Blood Glucose Monitoring Suppl (TRUE METRIX METER) w/Device KIT; Use  as directed  Dispense: 1 kit; Refill: 0 - glucose blood (TRUE METRIX BLOOD GLUCOSE TEST) test strip; Use as instructed  Dispense: 100 each; Refill: 12 - TRUEPLUS LANCETS 28G MISC; Use as directed  Dispense: 100 each;  Refill: 5  2. Essential hypertension At goal.  Continue current medicines  3. Morbid obesity due to excess calories [facility]) See #1 above   Patient was given the opportunity to ask questions.  Patient verbalized understanding of the plan and was able to repeat key elements of the plan.   No orders of the defined types were placed in this encounter.    Requested Prescriptions   Signed Prescriptions Disp Refills  . Blood Glucose Monitoring Suppl (TRUE METRIX METER) w/Device KIT 1 kit 0    Sig: Use as directed  . glucose blood (TRUE METRIX BLOOD GLUCOSE TEST) test strip 100 each 12    Sig: Use as instructed  . TRUEPLUS LANCETS 28G MISC 100 each 5    Sig: Use as directed    Return in about 6 weeks (around 12/26/2017).  Karle Plumber, MD, FACP

## 2017-11-17 ENCOUNTER — Ambulatory Visit: Payer: 59 | Admitting: Obstetrics & Gynecology

## 2017-11-23 ENCOUNTER — Encounter: Payer: Self-pay | Admitting: Internal Medicine

## 2017-11-23 DIAGNOSIS — F419 Anxiety disorder, unspecified: Secondary | ICD-10-CM

## 2017-11-24 MED ORDER — ESCITALOPRAM OXALATE 10 MG PO TABS: 10.0000 mg | ORAL_TABLET | Freq: Every day | ORAL | 6 refills | Status: DC

## 2017-12-05 ENCOUNTER — Ambulatory Visit: Payer: 59 | Admitting: Registered"

## 2017-12-11 ENCOUNTER — Encounter: Payer: Self-pay | Admitting: Registered"

## 2017-12-11 ENCOUNTER — Encounter: Payer: 59 | Attending: Internal Medicine | Admitting: Registered"

## 2017-12-11 DIAGNOSIS — Z6841 Body Mass Index (BMI) 40.0 and over, adult: Secondary | ICD-10-CM | POA: Insufficient documentation

## 2017-12-11 DIAGNOSIS — E119 Type 2 diabetes mellitus without complications: Secondary | ICD-10-CM

## 2017-12-11 DIAGNOSIS — Z713 Dietary counseling and surveillance: Secondary | ICD-10-CM | POA: Diagnosis not present

## 2017-12-11 NOTE — Progress Notes (Signed)
Diabetes Self-Management Education  Visit Type:  First/Initial  Appt. Start Time: 9:35 Appt. End Time: 10:55  12/11/2017  Ms. [facility], identified by name and date of birth, is a 42 y.o. female with a diagnosis of Diabetes: Type 2.   ASSESSMENT  Pt expectations: overall plan of how to start managing diabetes   Pt states she is a single mom of 4 sons (ages 33, 42, 84, and 22). Pt states she 2 of her sons have autism. Pt states she has been and continues to be stressed due to being having to manage so many things and does not have much support.   Pt states her PCP wants to hold off on medicine to see if she can manage diabetes with weight.  Pt states she stress eats and eats things like white rice and states she knows she should not eat that. Pt states she has had problems with keeping a steady job, now working Mon-Fri 11:30am-8pm. Pt states she has issues with balance and being able to provide for her family, some days she will not eat and provide for her sons to eat. Pt states she receives $21/month EBT benefits and her rent has increased because she has a job now. Pt states she does not have enough money each month for food. Pt states she was seeing a counselor and knows she needs counseling due to mental issues and anxiety, but does not think it can happen because of her work schedule.   Pt states she has gained about 50 lbs in the last 2 months. Pt states she has an exercise facility at work. Pt states she does not add salt to food and does not regularly plan for dinner. Pt states she used to eat a lot of chicken nuggets and fries when working and children were in school.    Next visit: carbohydrate counting, physical activity, chronic complications, strategies to promote behavioral change, support group  Height 5\' 2"  (1.575 m), weight 292 lb 1.6 oz (132.5 kg). Body mass index is 53.43 kg/m.   Diabetes Self-Management Education - 12/11/17 0942      Health Coping   How would you  rate your overall health?  Fair      Psychosocial Assessment   Patient Belief/Attitude about Diabetes  Defeat/Burnout    Self-care barriers  None    Self-management support  None    Patient Concerns  Nutrition/Meal planning    Special Needs  None    Preferred Learning Style  No preference indicated    Learning Readiness  Ready      Complications   Last HgB A1C per patient/outside source  6.8 %    How often do you check your blood sugar?  1-2 times/day    Fasting Blood glucose range (mg/dL)  [phone]    Postprandial Blood glucose range (mg/dL)  [phone]    Number of hypoglycemic episodes per month  0    Number of hyperglycemic episodes per week  2    Have you had a dilated eye exam in the past 12 months?  No    Have you had a dental exam in the past 12 months?  No    Are you checking your feet?  Yes    How many days per week are you checking your feet?  4      Dietary Intake   Breakfast  skips    Snack (morning)  none    Lunch  sometimes skips; spinach wrap  Snack (afternoon)  none    Dinner  sometimes skips; chili or hibachi    Snack (evening)  none    Beverage(s)  water, Hibiscus tea      Exercise   Exercise Type  ADL's    How many days per week to you exercise?  0    How many minutes per day do you exercise?  0    Total minutes per week of exercise  0      Patient Education   Previous Diabetes Education  No    Disease state   Definition of diabetes, type 1 and 2, and the diagnosis of diabetes;Factors that contribute to the development of diabetes    Nutrition management   Role of diet in the treatment of diabetes and the relationship between the three main macronutrients and blood glucose level    Monitoring  Purpose and frequency of SMBG.;Interpreting lab values - A1C, lipid, urine microalbumina.;Taught/discussed recording of test results and interpretation of SMBG.    Acute complications  Taught treatment of hypoglycemia - the 15 rule.;Discussed and  identified patients' treatment of hyperglycemia.      Individualized Goals (developed by patient)   Nutrition  General guidelines for healthy choices and portions discussed    Medications  Not Applicable    Monitoring   test blood glucose pre and post meals as discussed;test my blood glucose as discussed    Reducing Risk  do foot checks daily;treat hypoglycemia with 15 grams of carbs if blood glucose less than 70mg /dL      Post-Education Assessment   Patient understands the diabetes disease and treatment process.  Demonstrates understanding / competency    Patient understands incorporating nutritional management into lifestyle.  Needs Review    Patient undertands incorporating physical activity into lifestyle.  Needs Instruction    Patient understands using medications safely.  Needs Instruction    Patient understands monitoring blood glucose, interpreting and using results  Needs Instruction    Patient understands prevention, detection, and treatment of acute complications.  Needs Review    Patient understands prevention, detection, and treatment of chronic complications.  Needs Instruction    Patient understands how to develop strategies to address psychosocial issues.  Needs Instruction    Patient understands how to develop strategies to promote health/change behavior.  Needs Review      Outcomes   Program Status  Not Completed       Learning Objective:  Patient will have a greater understanding of diabetes self-management. Patient education plan is to attend individual and/or group sessions per assessed needs and concerns.   Plan:   Patient Instructions  - Aim to not skip meals. Eat breakfast, lunch, and dinner.   - Connect with food resources. See handouts.   - Eat carbohydrates, protein, and non-starchy vegetables with meals. See booklet, pg 15.   - Aim to check blood sugars 4x/day: fasting and 2 hours after meals. Track numbers.     Expected Outcomes:  Demonstrated  interest in learning. Expect positive outcomes  Education material provided: ADA Diabetes: Your Take Control Guide  If problems or questions, patient to contact team via:  Phone and Email  Future DSME appointment: - 4-6 wks

## 2017-12-11 NOTE — Patient Instructions (Signed)
-   Aim to not skip meals. Eat breakfast, lunch, and dinner.   - Connect with food resources. See handouts.   - Eat carbohydrates, protein, and non-starchy vegetables with meals. See booklet, pg 15.   - Aim to check blood sugars 4x/day: fasting and 2 hours after meals. Track numbers.

## 2017-12-15 ENCOUNTER — Other Ambulatory Visit (HOSPITAL_COMMUNITY)
Admission: RE | Admit: 2017-12-15 | Discharge: 2017-12-15 | Disposition: A | Discharge status: Home / Self Care | Payer: 59 | Source: Ambulatory Visit | Attending: Obstetrics & Gynecology | Admitting: Obstetrics & Gynecology

## 2017-12-15 ENCOUNTER — Encounter: Payer: Self-pay | Admitting: Obstetrics & Gynecology

## 2017-12-15 ENCOUNTER — Ambulatory Visit (INDEPENDENT_AMBULATORY_CARE_PROVIDER_SITE_OTHER): Payer: 59 | Admitting: Obstetrics & Gynecology

## 2017-12-15 VITALS — BP 108/60 | HR 77 | Ht 62.0 in | Wt 291.9 lb

## 2017-12-15 DIAGNOSIS — Z01419 Encounter for gynecological examination (general) (routine) without abnormal findings: Secondary | ICD-10-CM

## 2017-12-15 NOTE — Progress Notes (Signed)
Subjective:    Whitney BoucheShanni V Madej is a 42 y.o. divorced P4 (17, 8415, 10312, and 906 yo kids)  female who presents for an annual exam. The patient has no complaints today. The patient is not currently sexually active. GYN screening history: last pap: was normal. The patient wears seatbelts: yes. The patient participates in regular exercise: no. Has the patient ever been transfused or tattooed?: no. The patient reports that there is not domestic violence in her life.   Menstrual History: OB History    Gravida  4   Para  4   Term  4   Preterm  0   AB  0   Living  4     SAB  0   TAB  0   Ectopic  0   Multiple  0   Live Births  1           Menarche age: 89 No LMP recorded. (Menstrual status: IUD).    The following portions of the patient's history were reviewed and updated as appropriate: allergies, current medications, past family history, past medical history, past social history, past surgical history and problem list.  Review of Systems Pertinent items are noted in HPI.   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   Objective:    BP 108/60   Pulse 77   Ht 5\' 2"  (1.575 m)   Wt 291 lb 14.4 oz (132.4 kg)   BMI 53.39 kg/m   General Appearance:    Alert, cooperative, no distress, appears stated age  Head:    Normocephalic, without obvious abnormality, atraumatic  Eyes:    PERRL, conjunctiva/corneas clear, EOM's intact, fundi    benign, both eyes  Ears:    Normal TM's and external ear canals, both ears  Nose:   Nares normal, septum midline, mucosa normal, no drainage    or sinus tenderness  Throat:   Lips, mucosa, and tongue normal; teeth and gums normal  Neck:   Supple, symmetrical, trachea midline, no adenopathy;    thyroid:  no enlargement/tenderness/nodules; no carotid   bruit or JVD  Back:     Symmetric, no curvature,  ROM normal, no CVA tenderness  Lungs:     Clear to auscultation bilaterally, respirations unlabored  Chest Wall:    No tenderness or deformity   Heart:    Regular rate and rhythm, S1 and S2 normal, no murmur, rub   or gallop  Breast Exam:    No tenderness, masses, or nipple abnormality  Abdomen:     Soft, non-tender, bowel sounds active all four quadrants,    no masses, no organomegaly  Genitalia:    Normal female without lesion, discharge or tenderness, Liletta strings seen, no masses or tenderness with bimanual exam, acanthosis nigrican noted     Extremities:   Extremities normal, atraumatic, no cyanosis or edema  Pulses:   2+ and symmetric all extremities  Skin:   Skin color, texture, turgor normal, no rashes or lesions  Lymph nodes:   Cervical, supraclavicular, and axillary nodes normal  Neurologic:   CNII-XII intact, normal strength, sensation and reflexes    throughout  .    Assessment:    Healthy female exam.    Plan:     Thin prep Pap smear. with cotesting Mammogram to be scheduled Rec weight loss/healthy lifestyle

## 2017-12-15 NOTE — Progress Notes (Signed)
Mammogram scheduled for Monday 01/05/18 @ 7:20am @ [facility] Patient Notified

## 2017-12-17 LAB — CYTOLOGY - PAP: HPV: DETECTED — AB

## 2018-01-02 ENCOUNTER — Encounter: Payer: Self-pay | Admitting: Internal Medicine

## 2018-01-02 ENCOUNTER — Ambulatory Visit: Payer: 59 | Attending: Internal Medicine | Admitting: Internal Medicine

## 2018-01-02 VITALS — BP 120/84 | HR 76 | Temp 98.5°F | Resp 16 | Wt 293.2 lb

## 2018-01-02 DIAGNOSIS — G2581 Restless legs syndrome: Secondary | ICD-10-CM | POA: Diagnosis not present

## 2018-01-02 DIAGNOSIS — Z6841 Body Mass Index (BMI) 40.0 and over, adult: Secondary | ICD-10-CM | POA: Diagnosis not present

## 2018-01-02 DIAGNOSIS — Z79899 Other long term (current) drug therapy: Secondary | ICD-10-CM | POA: Diagnosis not present

## 2018-01-02 DIAGNOSIS — R87612 Low grade squamous intraepithelial lesion on cytologic smear of cervix (LGSIL): Secondary | ICD-10-CM | POA: Insufficient documentation

## 2018-01-02 DIAGNOSIS — E119 Type 2 diabetes mellitus without complications: Secondary | ICD-10-CM | POA: Diagnosis not present

## 2018-01-02 DIAGNOSIS — Z87891 Personal history of nicotine dependence: Secondary | ICD-10-CM | POA: Insufficient documentation

## 2018-01-02 DIAGNOSIS — E785 Hyperlipidemia, unspecified: Secondary | ICD-10-CM | POA: Diagnosis not present

## 2018-01-02 DIAGNOSIS — F329 Major depressive disorder, single episode, unspecified: Secondary | ICD-10-CM | POA: Diagnosis not present

## 2018-01-02 DIAGNOSIS — R7303 Prediabetes: Secondary | ICD-10-CM | POA: Diagnosis not present

## 2018-01-02 DIAGNOSIS — F411 Generalized anxiety disorder: Secondary | ICD-10-CM | POA: Diagnosis not present

## 2018-01-02 DIAGNOSIS — Z888 Allergy status to other drugs, medicaments and biological substances status: Secondary | ICD-10-CM | POA: Insufficient documentation

## 2018-01-02 DIAGNOSIS — Z8249 Family history of ischemic heart disease and other diseases of the circulatory system: Secondary | ICD-10-CM | POA: Diagnosis not present

## 2018-01-02 DIAGNOSIS — G4761 Periodic limb movement disorder: Secondary | ICD-10-CM | POA: Insufficient documentation

## 2018-01-02 DIAGNOSIS — Z23 Encounter for immunization: Secondary | ICD-10-CM | POA: Insufficient documentation

## 2018-01-02 DIAGNOSIS — I10 Essential (primary) hypertension: Secondary | ICD-10-CM | POA: Diagnosis not present

## 2018-01-02 DIAGNOSIS — K219 Gastro-esophageal reflux disease without esophagitis: Secondary | ICD-10-CM | POA: Diagnosis not present

## 2018-01-02 DIAGNOSIS — Z823 Family history of stroke: Secondary | ICD-10-CM | POA: Insufficient documentation

## 2018-01-02 DIAGNOSIS — Z8632 Personal history of gestational diabetes: Secondary | ICD-10-CM | POA: Diagnosis not present

## 2018-01-02 LAB — GLUCOSE, POCT (MANUAL RESULT ENTRY): POC Glucose: 122 mg/dl — AB (ref 70–99)

## 2018-01-02 MED ORDER — PNEUMOCOCCAL VAC POLYVALENT 25 MCG/0.5ML IJ INJ: 0.5000 mL | INJECTION | INTRAMUSCULAR | 0 refills | Status: AC

## 2018-01-02 NOTE — Progress Notes (Signed)
Patient ID: HOUA NIE, female    DOB: December 07, 1975  MRN: [id number]  CC: Follow-up  Subjective: Whitney White is a 42 y.o. female who presents for chronic ds management. Her concerns today include:  Patient with history of hypertension,anxiety/depressionr, GERD, DOE, DM, mild HLand obesity.  Had PAP by Dr. Hulan Fray which revealed LGSIL.  Scheduled for colpo.  DM: -saw nutritionist and found it helpful.  Next appt 01/15/2018.  Some changes she has made in eating habits are: trying to avoid skipping meals, eating more veggies, cut out sweets and controlling portions. -moving more and not getting short of breath as before.  Walking a few times a wk.  Plans to start playing soccer with her son -checks BS a few times a day.  A.m before BF 100-105, after lunch in the 130s.  HTN:  Compliant with meds and salt restriction  Patient Active Problem List   Diagnosis Date Noted  . Periodic limb movement 03/10/2017  . RLS (restless legs syndrome) 01/26/2017  . Mild diastolic dysfunction 42/59/5638  . Prediabetes 11/26/2016  . Hyperlipidemia 11/26/2016  . Anxiety state, unspecified 12/22/2013  . Obesity, unspecified 12/22/2013  . Chest pain 12/17/2013  . Depression   . Hypertension   . Eczema   . Gestational hypertension   . GERD (gastroesophageal reflux disease)      Current Outpatient Medications on File Prior to Visit  Medication Sig Dispense Refill  . amLODipine (NORVASC) 5 MG tablet Take 1 tablet (5 mg total) by mouth daily. 90 tablet 3  . Blood Glucose Monitoring Suppl (TRUE METRIX METER) w/Device KIT Use as directed 1 kit 0  . escitalopram (LEXAPRO) 10 MG tablet Take 1 tablet (10 mg total) by mouth daily. 30 tablet 6  . glucose blood (TRUE METRIX BLOOD GLUCOSE TEST) test strip Use as instructed 100 each 12  . hydrochlorothiazide (HYDRODIURIL) 25 MG tablet Take 1 tablet (25 mg total) by mouth daily. 30 tablet 5  . levonorgestrel (MIRENA) 20 MCG/24HR IUD 1 each by Intrauterine route  once.    . TRUEPLUS LANCETS 28G MISC Use as directed 100 each 5   No current facility-administered medications on file prior to visit.     Allergies  Allergen Reactions  . Lisinopril Cough  . Metformin And Related     Caused palpitations/heart pounding    Social History   Socioeconomic History  . Marital status: Divorced    Spouse name: Not on file  . Number of children: Not on file  . Years of education: Not on file  . Highest education level: Not on file  Occupational History  . Not on file  Social Needs  . Financial resource strain: Not on file  . Food insecurity:    Worry: Not on file    Inability: Not on file  . Transportation needs:    Medical: Not on file    Non-medical: Not on file  Tobacco Use  . Smoking status: Former Research scientist (life sciences)  . Smokeless tobacco: Never Used  Substance and Sexual Activity  . Alcohol use: No  . Drug use: No  . Sexual activity: Not on file    Comment: merania removed  Lifestyle  . Physical activity:    Days per week: Not on file    Minutes per session: Not on file  . Stress: Not on file  Relationships  . Social connections:    Talks on phone: Not on file    Gets together: Not on file    Attends  religious service: Not on file    Active member of club or organization: Not on file    Attends meetings of clubs or organizations: Not on file    Relationship status: Not on file  . Intimate partner violence:    Fear of current or ex partner: Not on file    Emotionally abused: Not on file    Physically abused: Not on file    Forced sexual activity: Not on file  Other Topics Concern  . Not on file  Social History Narrative  . Not on file    Family History  Problem Relation Age of Onset  . Hypertension Mother   . Stroke Mother   . Diabetes Maternal Grandmother   . Hypertension Maternal Grandmother   . Cancer Maternal Aunt        breast cancer   . Anesthesia problems Neg Hx     Past Surgical History:  Procedure Laterality Date  .  NO PAST SURGERIES      ROS: Review of Systems Neg except as above PHYSICAL EXAM: BP 120/84   Pulse 76   Temp 98.5 F (36.9 C) (Oral)   Resp 16   Wt 293 lb 3.2 oz (133 kg)   SpO2 98%   BMI 53.63 kg/m   Wt Readings from Last 3 Encounters:  01/02/18 293 lb 3.2 oz (133 kg)  12/15/17 291 lb 14.4 oz (132.4 kg)  12/11/17 292 lb 1.6 oz (132.5 kg)   Physical Exam  General appearance - alert, well appearing, and in no distress Mental status - normal mood, behavior, speech, dress, motor activity, and thought processes Neck - supple, no significant adenopathy Chest - clear to auscultation, no wheezes, rales or rhonchi, symmetric air entry Heart - normal rate, regular rhythm, normal S1, S2, no murmurs, rubs, clicks or gallops Extremities - peripheral pulses normal, no pedal edema, no clubbing or cyanosis Diabetic Foot Exam - Simple   Simple Foot Form Visual Inspection See comments:  Yes Sensation Testing Intact to touch and monofilament testing bilaterally:  Yes Pulse Check Posterior Tibialis and Dorsalis pulse intact bilaterally:  Yes Comments Toenails are overgrown.  Very dry skin on the heels.      Results for orders placed or performed in visit on 01/02/18  POCT glucose (manual entry)  Result Value Ref Range   POC Glucose 122 (A) 70 - 99 mg/dl   Lab Results  Component Value Date   HGBA1C 6.8 (H) 10/08/2017     ASSESSMENT AND PLAN: 1. Controlled type 2 diabetes mellitus without complication, without long-term current use of insulin ([place]) Discussed the importance of healthy eating habits, regular aerobic exercise (at least 150 minutes a week as tolerated) and medication compliance to achieve or maintain control of diabetes. Commended her on her efforts so far and changing eating habits and trying to move more. Reported blood sugars are good.  We will hold off on starting any medication - POCT glucose (manual entry)  2. Morbid obesity due to excess calories ([place]) See  # 1  3. Essential hypertension She is close to goal of 130/80.  Continue HCTZ and amlodipine.  4. Low grade squamous intraepithelial lesion on cytologic smear of cervix (LGSIL) Keep appointment with GYN.  5. Need for vaccination against Streptococcus pneumoniae -will be ordered through pharmacy    Patient was given the opportunity to ask questions.  Patient verbalized understanding of the plan and was able to repeat key elements of the plan.   Orders Placed This  Encounter  Procedures  . Pneumococcal polysaccharide vaccine 23-valent greater than or equal to 2yo subcutaneous/IM  . POCT glucose (manual entry)     Requested Prescriptions   Signed Prescriptions Disp Refills  . pneumococcal 23 valent vaccine (PNEUMOVAX 23) 25 MCG/0.5ML injection 2.5 mL 0    Sig: Inject 0.5 mLs into the muscle tomorrow at 10 am for 1 dose.    Return in about 4 months (around 05/04/2018).  Karle Plumber, MD, FACP

## 2018-01-02 NOTE — Patient Instructions (Addendum)
Please schedule an eye exam with an optometrist or ophthalmologist.  As a diabetic you need to have an eye exam at least once a year.   Pneumococcal Polysaccharide Vaccine: What You Need to Know 1. Why get vaccinated? Vaccination can protect older adults (and some children and younger adults) from pneumococcal disease. Pneumococcal disease is caused by bacteria that can spread from person to person through close contact. It can cause ear infections, and it can also lead to more serious infections of the:  Lungs (pneumonia),  Blood (bacteremia), and  Covering of the brain and spinal cord (meningitis). Meningitis can cause deafness and brain damage, and it can be fatal.  Anyone can get pneumococcal disease, but children under 72 years of age, people with certain medical conditions, adults over 73 years of age, and cigarette smokers are at the highest risk. About 18,000 older adults die each year from pneumococcal disease in the [place]. Treatment of pneumococcal infections with penicillin and other drugs used to be more effective. But some strains of the disease have become resistant to these drugs. This makes prevention of the disease, through vaccination, even more important. 2. Pneumococcal polysaccharide vaccine (PPSV23) Pneumococcal polysaccharide vaccine (PPSV23) protects against 23 types of pneumococcal bacteria. It will not prevent all pneumococcal disease. PPSV23 is recommended for:  All adults 61 years of age and older,  Anyone 2 through 40 years of age with certain long-term health problems,  Anyone 2 through 40 years of age with a weakened immune system,  Adults 32 through 40 years of age who smoke cigarettes or have asthma.  Most people need only one dose of PPSV. A second dose is recommended for certain high-risk groups. People 59 and older should get a dose even if they have gotten one or more doses of the vaccine before they turned 65. Your healthcare provider can  give you more information about these recommendations. Most healthy adults develop protection within 2 to 3 weeks of getting the shot. 3. Some people should not get this vaccine  Anyone who has had a life-threatening allergic reaction to PPSV should not get another dose.  Anyone who has a severe allergy to any component of PPSV should not receive it. Tell your provider if you have any severe allergies.  Anyone who is moderately or severely ill when the shot is scheduled may be asked to wait until they recover before getting the vaccine. Someone with a mild illness can usually be vaccinated.  Children less than 20 years of age should not receive this vaccine.  There is no evidence that PPSV is harmful to either a pregnant woman or to her fetus. However, as a precaution, women who need the vaccine should be vaccinated before becoming pregnant, if possible. 4. Risks of a vaccine reaction With any medicine, including vaccines, there is a chance of side effects. These are usually mild and go away on their own, but serious reactions are also possible. About half of people who get PPSV have mild side effects, such as redness or pain where the shot is given, which go away within about two days. Less than 1 out of 100 people develop a fever, muscle aches, or more severe local reactions. Problems that could happen after any vaccine:  People sometimes faint after a medical procedure, including vaccination. Sitting or lying down for about 15 minutes can help prevent fainting, and injuries caused by a fall. Tell your doctor if you feel dizzy, or have vision changes or ringing in  the ears.  Some people get severe pain in the shoulder and have difficulty moving the arm where a shot was given. This happens very rarely.  Any medication can cause a severe allergic reaction. Such reactions from a vaccine are very rare, estimated at about 1 in a million doses, and would happen within a few minutes to a few hours  after the vaccination. As with any medicine, there is a very remote chance of a vaccine causing a serious injury or death. The safety of vaccines is always being monitored. For more information, visit: http://floyd.org/www.cdc.gov/vaccinesafety/ 5. What if there is a serious reaction? What should I look for? Look for anything that concerns you, such as signs of a severe allergic reaction, very high fever, or unusual behavior. Signs of a severe allergic reaction can include hives, swelling of the face and throat, difficulty breathing, a fast heartbeat, dizziness, and weakness. These would usually start a few minutes to a few hours after the vaccination. What should I do? If you think it is a severe allergic reaction or other emergency that can't wait, call 9-1-1 or get to the nearest hospital. Otherwise, call your doctor. Afterward, the reaction should be reported to the Vaccine Adverse Event Reporting System (VAERS). Your doctor might file this report, or you can do it yourself through the VAERS web site at www.vaers.LAgents.nohhs.gov, or by calling 1-865-169-2307. VAERS does not give medical advice. 6. How can I learn more?  Ask your doctor. He or she can give you the vaccine package insert or suggest other sources of information.  Call your local or state health department.  Contact the Centers for Disease Control and Prevention (CDC): ? Call (862) 108-57001-684-065-2300 (1-800-CDC-INFO) or ? Visit CDC's website at PicCapture.uywww.cdc.gov/vaccines CDC Pneumococcal Polysaccharide Vaccine VIS (09/24/13) This information is not intended to replace advice given to you by your health care provider. Make sure you discuss any questions you have with your health care provider. Document Released: 03/17/2006 Document Revised: 02/08/2016 Document Reviewed: 02/08/2016 Elsevier Interactive Patient Education  2017 [employer]

## 2018-01-05 ENCOUNTER — Ambulatory Visit: Payer: 59

## 2018-01-10 IMAGING — CT CT HEAD W/O CM
3 of 4 series · 15 of 47 positions shown, 18 images · non-contrast
Comparison: None.

CLINICAL DATA: 32-year-old female with left-sided weakness.

EXAM:
CT HEAD WITHOUT CONTRAST
TECHNIQUE: Contiguous axial images were obtained from the base of the skull
through the vertex without intravenous contrast.

[Series 2: head w/o · axial · non-contrast · 0.41mm/px · z∈[-153,-33]mm · 9 of 29 slices shown, 12 images]
[im 3/29  brain]
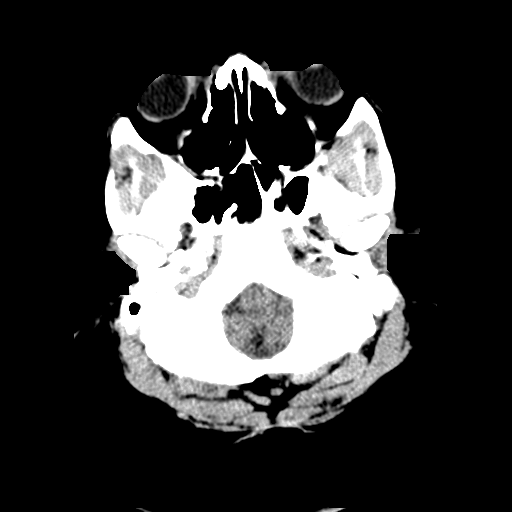
[im 3/29  bone]
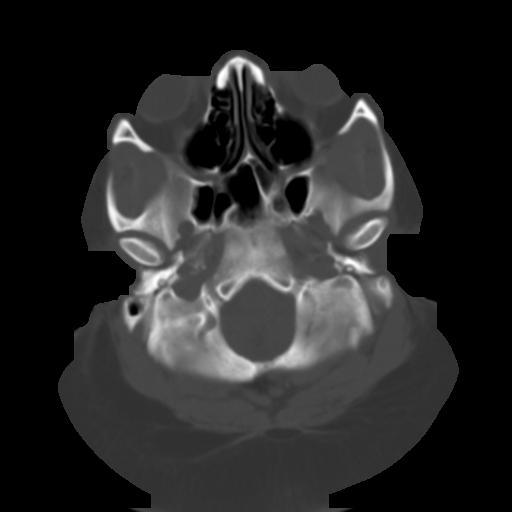
[im 7/29  brain]
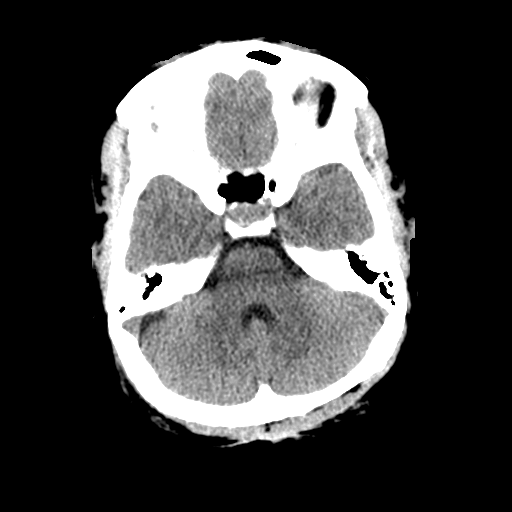
[im 9/29  brain]
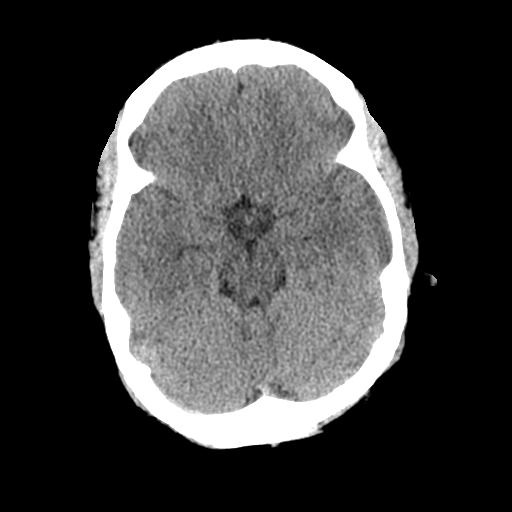
[im 13/29  brain]
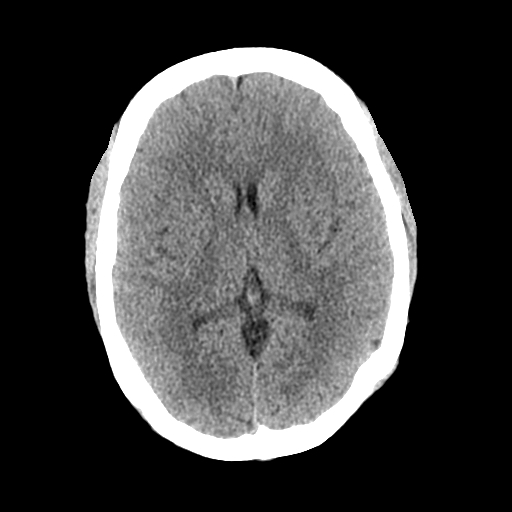
[im 15/29  brain]
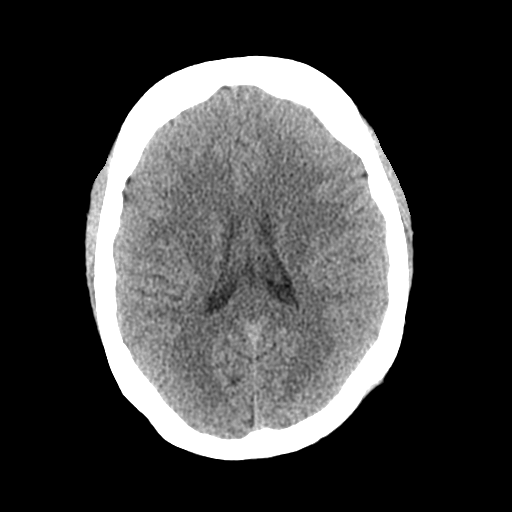
[im 15/29  bone]
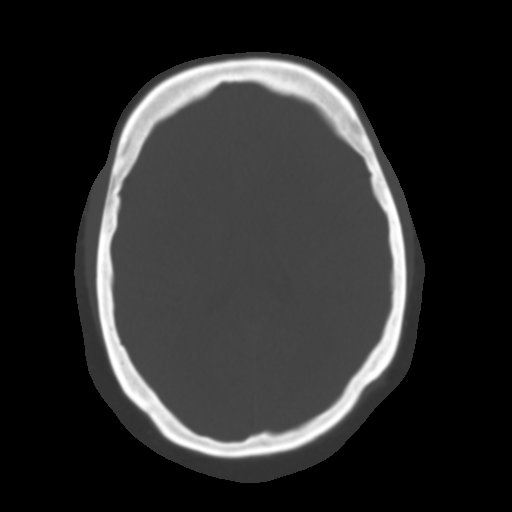
[im 17/29  brain]
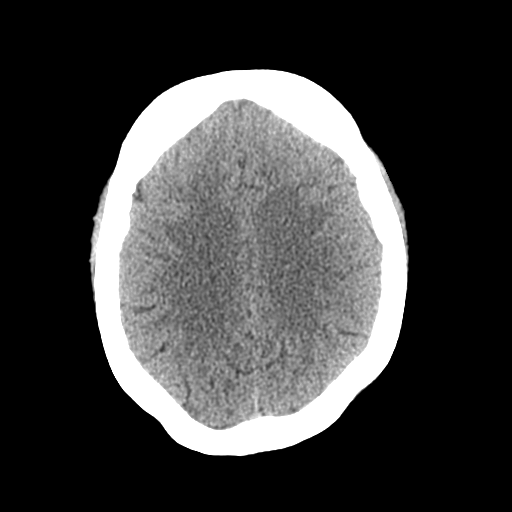
[im 21/29  brain]
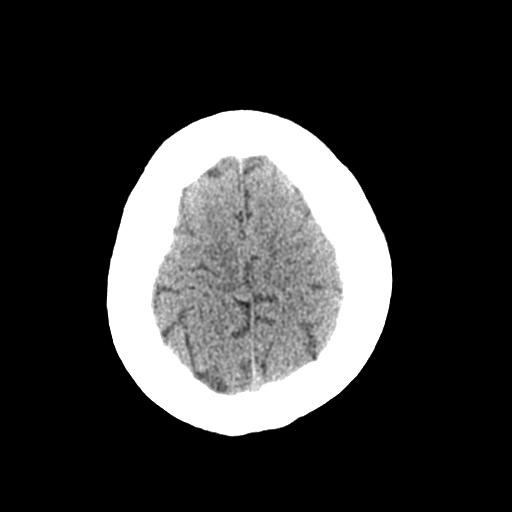
[im 23/29  brain]
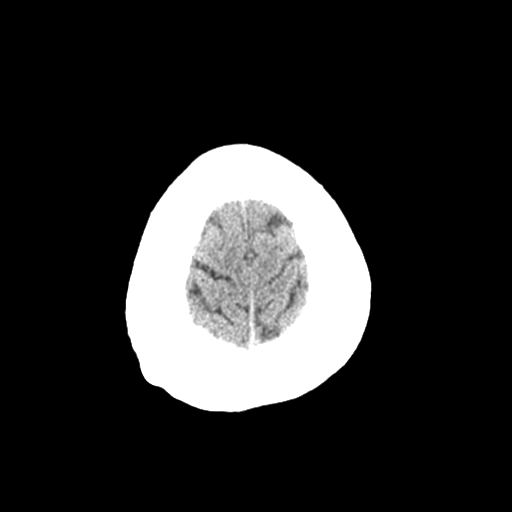
[im 27/29  brain]
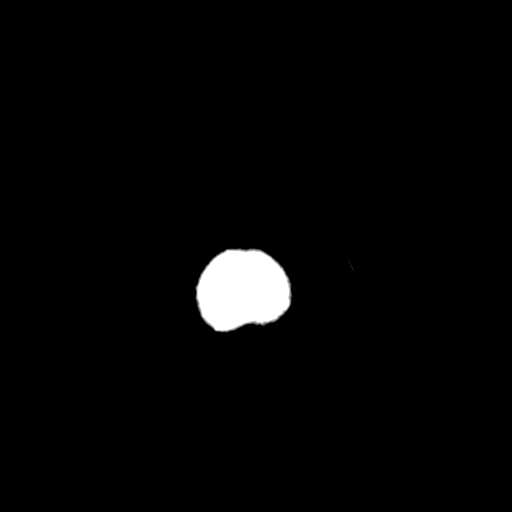
[im 27/29  bone]
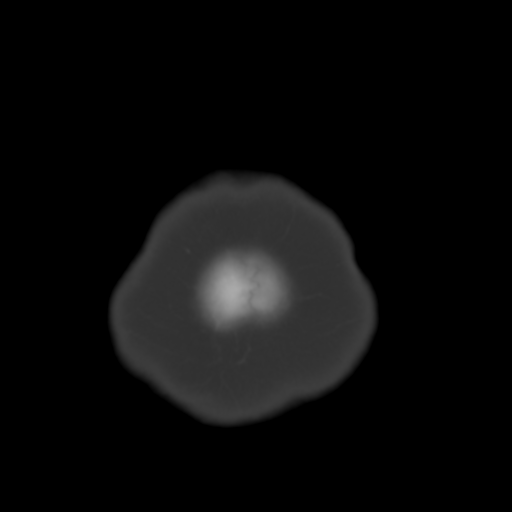

[Series 5: coronal · coronal · 0.28mm/px · 3 of 67 slices shown]
[im 23/67  brain]
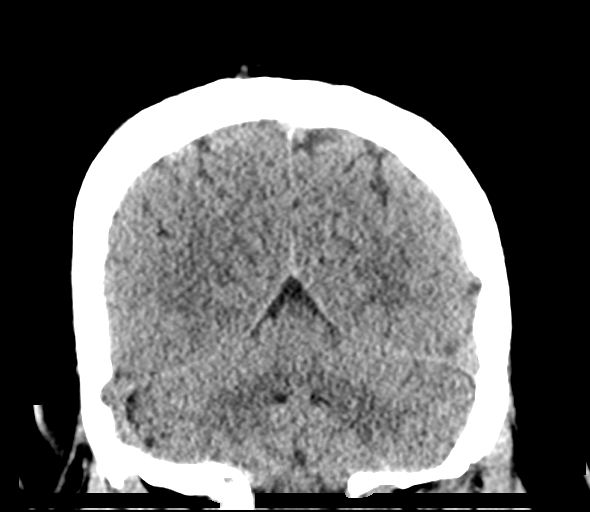
[im 30/67  brain]
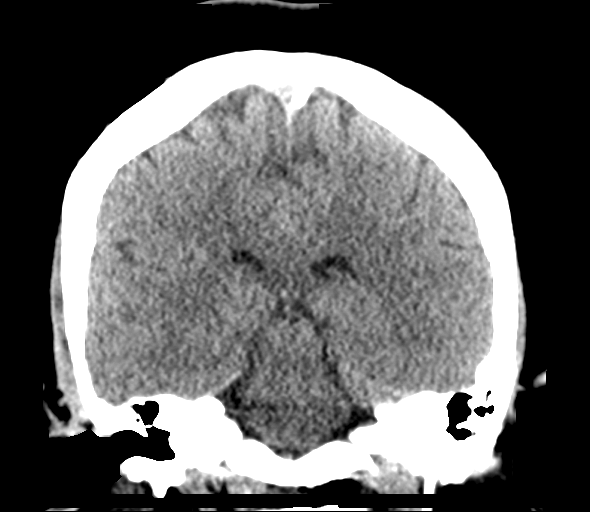
[im 37/67  brain]
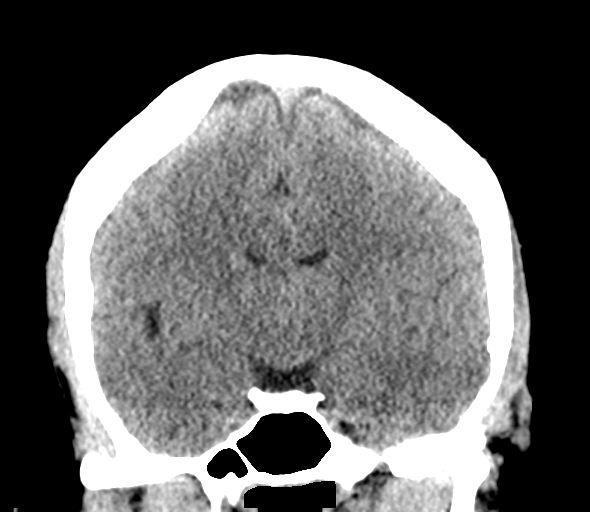

[Series 6: sagittal · sagittal · 0.27mm/px · 3 of 56 slices shown]
[im 19/56  brain]
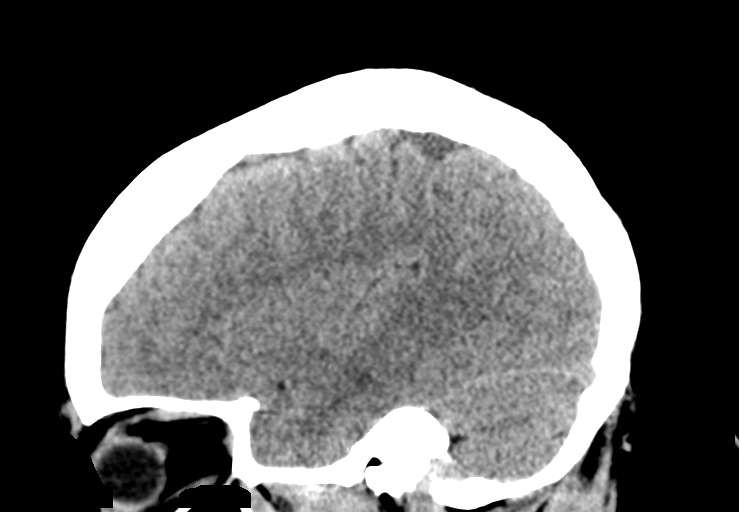
[im 28/56  brain]
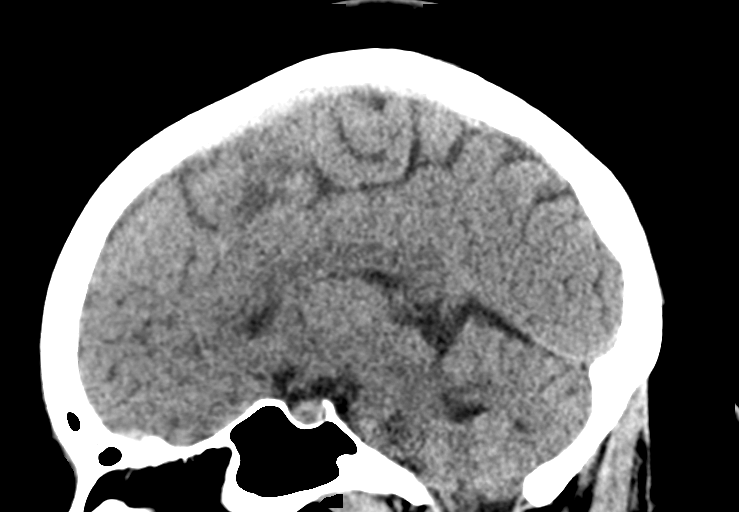
[im 37/56  brain]
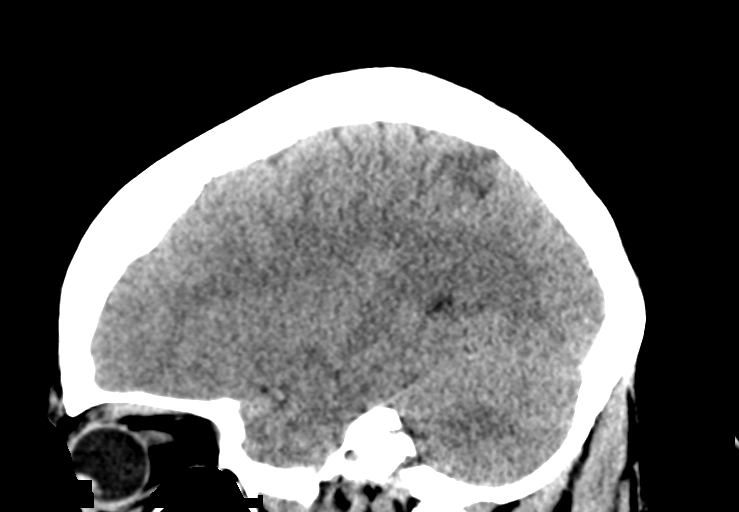

[15 of 47 positions shown; findings below may reference images not displayed]

FINDINGS: Brain: No evidence of acute infarction, hemorrhage, hydrocephalus,
extra-axial collection or mass lesion/mass effect.

Vascular: No hyperdense vessel or unexpected calcification.

Skull: Normal. Negative for fracture or focal lesion.

Sinuses/Orbits: No acute finding.

Other: None.
IMPRESSION: Normal unenhanced CT of the brain.

## 2018-01-15 ENCOUNTER — Encounter: Payer: Self-pay | Admitting: Registered"

## 2018-01-15 ENCOUNTER — Encounter: Payer: 59 | Attending: Internal Medicine | Admitting: Registered"

## 2018-01-15 DIAGNOSIS — Z6841 Body Mass Index (BMI) 40.0 and over, adult: Secondary | ICD-10-CM | POA: Insufficient documentation

## 2018-01-15 DIAGNOSIS — E119 Type 2 diabetes mellitus without complications: Secondary | ICD-10-CM

## 2018-01-15 DIAGNOSIS — Z713 Dietary counseling and surveillance: Secondary | ICD-10-CM | POA: Diagnosis not present

## 2018-01-15 NOTE — Progress Notes (Signed)
Diabetes Self-Management Education  Visit Type:  Follow-up  Appt. Start Time: 9:08 Appt. End Time: 10:00  01/15/2018  [name], identified by name and date of birth, is a 42 y.o. female with a diagnosis of Diabetes: Type 2.   ASSESSMENT  Pt states she has started eating earlier in the morning.   Pt states she has been checking blood sugars 3x/day: FBS (92), after meals (124-138) and at bedtime (125-135). Pt states she has started walking on treadmill 20 min, at least 3 days/week. Pt is doing well making behavioral changes.   Height 5\' 2"  (1.575 m), weight 294 lb 6.4 oz (133.5 kg). Body mass index is 53.85 kg/m.   Diabetes Self-Management Education - 01/15/18 0911      Health Coping   How would you rate your overall health?  Good      Psychosocial Assessment   Self-care barriers  None    Self-management support  Doctor's office    Patient Concerns  Nutrition/Meal planning;Weight Control;Healthy Lifestyle    Special Needs  None    Preferred Learning Style  No preference indicated    Learning Readiness  Change in progress      Complications   Last HgB A1C per patient/outside source  6.7 %    How often do you check your blood sugar?  3-4 times/day    Fasting Blood glucose range (mg/dL)  [phone]    Postprandial Blood glucose range (mg/dL)  604-540;[phone];70-129    Number of hypoglycemic episodes per month  0    Number of hyperglycemic episodes per week  1    Can you tell when your blood sugar is high?  Yes    What do you do if your blood sugar is high?  drinks water, walks around at job    Have you had a dilated eye exam in the past 12 months?  No    Have you had a dental exam in the past 12 months?  No    Are you checking your feet?  Yes    How many days per week are you checking your feet?  7      Dietary Intake   Breakfast  boiled eggs + fruit or oatmeal + fruit or greek yogurt     Snack (morning)  none    Lunch  spinach wrap ([place]key, swiss, olives, lettuce, tomatoes,  onions, banana peppers)    Snack (afternoon)  none    Dinner  boneless skinless chicken + [place] blend + yellow rice or veggie spaghetti noddles + lean beef + vegetables + salad or clam strips + hushpuppies    Snack (evening)  none    Beverage(s)  water, Nature's Twist      Exercise   Exercise Type  Light (walking / raking leaves)    How many days per week to you exercise?  3    How many minutes per day do you exercise?  20    Total minutes per week of exercise  60      Patient Education   Previous Diabetes Education  Yes (please comment)    Nutrition management   Carbohydrate counting    Physical activity and exercise   Role of exercise on diabetes management, blood pressure control and cardiac health.    Monitoring  Daily foot exams;Yearly dilated eye exam    Chronic complications  Retinopathy and reason for yearly dilated eye exams;Relationship between chronic complications and blood glucose control;Lipid levels, blood glucose control and heart  disease;Dental care;Nephropathy, what it is, prevention of, the use of ACE, ARB's and early detection of through urine microalbumia.;Assessed and discussed foot care and prevention of foot problems;Reviewed with patient heart disease, higher risk of, and prevention    Psychosocial adjustment  Helped patient identify a support system for diabetes management    Personal strategies to promote health  Helped patient develop diabetes management plan for (enter comment)   discussed strategies for eating out     Individualized Goals (developed by patient)   Nutrition  Follow meal plan discussed;General guidelines for healthy choices and portions discussed;Adjust meds/carbs with exercise as discussed    Physical Activity  Exercise 3-5 times per week    Medications  take my medication as prescribed    Monitoring   test blood glucose pre and post meals as discussed    Reducing Risk  examine blood glucose patterns;do foot checks daily    Health Coping   Not Applicable      Post-Education Assessment   Patient understands the diabetes disease and treatment process.  Demonstrates understanding / competency    Patient understands incorporating nutritional management into lifestyle.  Demonstrates understanding / competency    Patient undertands incorporating physical activity into lifestyle.  Demonstrates understanding / competency    Patient understands using medications safely.  Demonstrates understanding / competency    Patient understands monitoring blood glucose, interpreting and using results  Demonstrates understanding / competency    Patient understands prevention, detection, and treatment of acute complications.  Demonstrates understanding / competency    Patient understands prevention, detection, and treatment of chronic complications.  Demonstrates understanding / competency    Patient understands how to develop strategies to address psychosocial issues.  Demonstrates understanding / competency    Patient understands how to develop strategies to promote health/change behavior.  Demonstrates understanding / competency      Outcomes   Program Status  Completed      Subsequent Visit   Since your last visit have you continued or begun to take your medications as prescribed?  Yes    Since your last visit have you had your blood pressure checked?  Yes    Is your most recent blood pressure lower, unchanged, or higher since your last visit?  Unchanged    Since your last visit have you experienced any weight changes?  No change    Since your last visit, are you checking your blood glucose at least once a day?  Yes       Learning Objective:  Patient will have a greater understanding of diabetes self-management. Patient education plan is to attend individual and/or group sessions per assessed needs and concerns.   Plan:   There are no Patient Instructions on file for this visit.   Expected Outcomes:  Demonstrated interest in learning.  Expect positive outcomes  Education material provided: ADA Diabetes: Your Take Control Guide, Meal plan card, Support group flyer and Carbohydrate counting sheet  If problems or questions, patient to contact team via:  Phone and Email  Future DSME appointment: - Yearly

## 2018-01-27 ENCOUNTER — Telehealth: Payer: Self-pay | Admitting: *Deleted

## 2018-01-27 DIAGNOSIS — I10 Essential (primary) hypertension: Secondary | ICD-10-CM

## 2018-01-27 NOTE — Telephone Encounter (Signed)
Medical Assistant left message on patient's home and cell voicemail. Voicemail states to give a call back [facility] [place] [facility] [phone]. Patient just needs a lab visit for urine.

## 2018-01-29 ENCOUNTER — Ambulatory Visit (INDEPENDENT_AMBULATORY_CARE_PROVIDER_SITE_OTHER): Payer: 59 | Admitting: Obstetrics & Gynecology

## 2018-01-29 ENCOUNTER — Encounter: Payer: Self-pay | Admitting: Obstetrics & Gynecology

## 2018-01-29 ENCOUNTER — Other Ambulatory Visit (HOSPITAL_COMMUNITY)
Admission: RE | Admit: 2018-01-29 | Discharge: 2018-01-29 | Disposition: A | Discharge status: Home / Self Care | Payer: 59 | Source: Ambulatory Visit | Attending: Obstetrics & Gynecology | Admitting: Obstetrics & Gynecology

## 2018-01-29 VITALS — BP 127/79 | HR 80 | Ht 62.0 in | Wt 291.0 lb

## 2018-01-29 DIAGNOSIS — R87612 Low grade squamous intraepithelial lesion on cytologic smear of cervix (LGSIL): Secondary | ICD-10-CM | POA: Diagnosis not present

## 2018-01-29 DIAGNOSIS — N871 Moderate cervical dysplasia: Secondary | ICD-10-CM | POA: Insufficient documentation

## 2018-01-29 DIAGNOSIS — Z3202 Encounter for pregnancy test, result negative: Secondary | ICD-10-CM

## 2018-01-29 DIAGNOSIS — Z23 Encounter for immunization: Secondary | ICD-10-CM | POA: Diagnosis not present

## 2018-01-29 LAB — POCT PREGNANCY, URINE: PREG TEST UR: NEGATIVE

## 2018-01-29 NOTE — Progress Notes (Signed)
   Subjective:    Patient ID: Whitney BoucheShanni V White, female    DOB: Oct 26, 1975, 42 y.o.   MRN: [id number]  HPI 42 yo married P4 here for a colpo. Her pap from 12/15/17 showed LGSIL.    Review of Systems     Objective:   Physical Exam Breathing, conversing, and ambulating normally Morbidly obese, well hydrated Black female, no apparent distress UPT negative, consent signed, time out done Cervix prepped with acetic acid. Transformation zone seen in its entirety. Colpo adequate. Punctation and dense acetowhite change seen conistently on the anterior lip of the cervix extending about 1 cm from the cervical os. There were a few spots of punctation on the posterior lip. I biopsied at the 11 and 1 o'clock positions. Silver nitrate achieved hemostasis. ECC obtained. She tolerated the procedure well.     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

## 2018-01-29 NOTE — Addendum Note (Signed)
Addended by: Kathee DeltonHILLMAN, CARRIE L on: 01/29/2018 04:48 PM   Modules accepted: Orders

## 2018-01-29 NOTE — Addendum Note (Signed)
Addended by: Kathee DeltonHILLMAN, CARRIE L on: 01/29/2018 11:48 AM   Modules accepted: Orders

## 2018-02-11 ENCOUNTER — Encounter: Payer: Self-pay | Admitting: Obstetrics & Gynecology

## 2018-02-11 ENCOUNTER — Ambulatory Visit (INDEPENDENT_AMBULATORY_CARE_PROVIDER_SITE_OTHER): Payer: 59 | Admitting: Obstetrics & Gynecology

## 2018-02-11 VITALS — Wt 291.9 lb

## 2018-02-11 DIAGNOSIS — D069 Carcinoma in situ of cervix, unspecified: Secondary | ICD-10-CM | POA: Diagnosis not present

## 2018-02-11 NOTE — Progress Notes (Signed)
   Subjective:    Patient ID: Whitney White, female    DOB: 09-25-1975, 42 y.o.   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 She uses an IUD for contraception.   Review of Systems     Objective:   Physical Exam Breathing, conversing, and ambulating normally Well nourished, well hydrated Black female, no apparent distress Abd- benign  1. Cervix, biopsy, 11 and 1 o'clock - HIGH GRADE SQUAMOUS INTRAEPITHELIAL LESION (CIN 2-3; MODERATE TO SEVERE DYSPLASIA) 2. Endocervix, curettage - BENIGN ENDOCERVICAL GLANDULAR EPITHELIUM - BENIGN LOWER UTERINE SEGMENT - NO SQUAMOUS EPITHELIUM IDENTIFIED     Assessment & Plan:  CIN 2&3- plan for LEEP She will watch the video today.

## 2018-03-08 ENCOUNTER — Encounter: Payer: Self-pay | Admitting: Internal Medicine

## 2018-03-08 DIAGNOSIS — I10 Essential (primary) hypertension: Secondary | ICD-10-CM

## 2018-03-09 MED ORDER — AMLODIPINE BESYLATE 5 MG PO TABS: 5.0000 mg | ORAL_TABLET | Freq: Every day | ORAL | 3 refills | Status: DC

## 2018-03-09 MED ORDER — ATORVASTATIN CALCIUM 10 MG PO TABS: 10.0000 mg | ORAL_TABLET | Freq: Every day | ORAL | 3 refills | Status: DC

## 2018-03-18 ENCOUNTER — Other Ambulatory Visit (HOSPITAL_COMMUNITY)
Admission: RE | Admit: 2018-03-18 | Discharge: 2018-03-18 | Disposition: A | Discharge status: Home / Self Care | Payer: 59 | Source: Ambulatory Visit | Attending: Obstetrics & Gynecology | Admitting: Obstetrics & Gynecology

## 2018-03-18 ENCOUNTER — Encounter: Payer: Self-pay | Admitting: Obstetrics & Gynecology

## 2018-03-18 ENCOUNTER — Ambulatory Visit (INDEPENDENT_AMBULATORY_CARE_PROVIDER_SITE_OTHER): Payer: 59 | Admitting: Obstetrics & Gynecology

## 2018-03-18 VITALS — BP 129/89 | HR 88 | Wt 289.0 lb

## 2018-03-18 DIAGNOSIS — D069 Carcinoma in situ of cervix, unspecified: Secondary | ICD-10-CM

## 2018-03-18 LAB — POCT PREGNANCY, URINE: Preg Test, Ur: NEGATIVE

## 2018-03-18 NOTE — Progress Notes (Signed)
   Subjective:    Patient ID: Whitney White, female    DOB: Jun 08, 1975, 42 y.o.   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   Review of Systems     Objective:   Physical Exam Breathing, conversing, and ambulating normally Well nourished, well hydrated Black female, no apparent distress  Colpo Biopsy:   Risks, benefits, alternatives, and limitations of procedure explained to patient, including pain, bleeding, infection, failure to remove abnormal tissue and failure to cure dysplasia, need for repeat procedures, damage to pelvic organs, cervical incompetence.  Role of HPV,cervical dysplasia and need for close followup was empasized. Informed written consent was obtained. All questions were answered. Time out performed. Urine pregnancy test was negative.  Procedure: The patient was placed in lithotomy position and the bivalved coated speculum was placed in the patient's vagina. A grounding pad placed on the patient. Acetic acid was applied to the cervix and areas of decreased uptake were noted around the transformation zone. The posterior lip had a very large area of dysplasia.   Local anesthesia was administered via an intracervical block using 15cc of 2% Lidocaine with epinephrine. The suction was turned on and the large semicircular handle on 50 Watts of cutting current was used to excise the entire transformation zone and any areas of visible dysplasia. I obtained an ECC.  Excellent hemostasis was achieved using roller ball coagulation set at 50 Watts coagulation current. The speculum was removed from the vagina. Specimens were sent to pathology.  The patient tolerated the procedure well. Post-operative instructions given to patient, including instruction to seek medical attention for persistent bright red bleeding, fever, abdominal/pelvic pain, dysuria, nausea or vomiting. She was also told about the possibility of having copious  yellow to black tinged discharge for weeks. She was counseled to avoid anything in the vagina (sex/douching/tampons) for 4 weeks. She has a 4 week post-operative check to assess wound healing, review results and discuss further management.       Assessment & Plan:  CIN 2 and 3- s/p LEEP Come back 4 weeks for post op check and Gardasil #2

## 2018-03-18 NOTE — Addendum Note (Signed)
Addended by: Allie Bossier on: 03/18/2018 11:35 AM   Modules accepted: Orders

## 2018-03-26 ENCOUNTER — Encounter: Payer: Self-pay | Admitting: Internal Medicine

## 2018-03-27 ENCOUNTER — Telehealth: Payer: Self-pay | Admitting: Family Medicine

## 2018-03-27 NOTE — Telephone Encounter (Signed)
Called pt about a message that was left on the answering service about rescheduling her vaccination appt. No answer and left a VM for pt to give the office a call back.

## 2018-03-30 ENCOUNTER — Ambulatory Visit: Payer: 59

## 2018-03-31 ENCOUNTER — Encounter: Payer: Self-pay | Admitting: Internal Medicine

## 2018-04-07 ENCOUNTER — Encounter: Payer: Self-pay | Admitting: Internal Medicine

## 2018-04-07 DIAGNOSIS — I10 Essential (primary) hypertension: Secondary | ICD-10-CM

## 2018-04-07 MED ORDER — HYDROCHLOROTHIAZIDE 25 MG PO TABS: 25.0000 mg | ORAL_TABLET | Freq: Every day | ORAL | 5 refills | Status: DC

## 2018-04-29 ENCOUNTER — Ambulatory Visit: Payer: 59 | Admitting: Internal Medicine

## 2018-05-11 ENCOUNTER — Ambulatory Visit: Payer: 59 | Admitting: Internal Medicine

## 2018-06-29 ENCOUNTER — Other Ambulatory Visit: Payer: Self-pay | Admitting: Internal Medicine

## 2018-06-29 DIAGNOSIS — F419 Anxiety disorder, unspecified: Secondary | ICD-10-CM

## 2018-07-12 ENCOUNTER — Other Ambulatory Visit: Payer: Self-pay | Admitting: Internal Medicine

## 2018-07-24 ENCOUNTER — Encounter: Payer: Self-pay | Admitting: Internal Medicine

## 2018-07-31 ENCOUNTER — Ambulatory Visit: Payer: 59

## 2018-08-03 ENCOUNTER — Other Ambulatory Visit (HOSPITAL_COMMUNITY)
Admission: RE | Admit: 2018-08-03 | Discharge: 2018-08-03 | Disposition: A | Discharge status: Home / Self Care | Payer: Medicaid Other | Source: Ambulatory Visit | Attending: Obstetrics & Gynecology | Admitting: Obstetrics & Gynecology

## 2018-08-03 ENCOUNTER — Ambulatory Visit (INDEPENDENT_AMBULATORY_CARE_PROVIDER_SITE_OTHER): Payer: Medicaid Other | Admitting: Obstetrics & Gynecology

## 2018-08-03 ENCOUNTER — Encounter: Payer: Self-pay | Admitting: Obstetrics & Gynecology

## 2018-08-03 VITALS — BP 129/82 | HR 81 | Ht 62.0 in | Wt 278.7 lb

## 2018-08-03 DIAGNOSIS — E119 Type 2 diabetes mellitus without complications: Secondary | ICD-10-CM

## 2018-08-03 DIAGNOSIS — N3941 Urge incontinence: Secondary | ICD-10-CM

## 2018-08-03 DIAGNOSIS — D069 Carcinoma in situ of cervix, unspecified: Secondary | ICD-10-CM | POA: Diagnosis not present

## 2018-08-03 DIAGNOSIS — Z0000 Encounter for general adult medical examination without abnormal findings: Secondary | ICD-10-CM

## 2018-08-03 DIAGNOSIS — Z23 Encounter for immunization: Secondary | ICD-10-CM

## 2018-08-03 LAB — POCT URINALYSIS DIP (DEVICE)
Bilirubin Urine: NEGATIVE
GLUCOSE, UA: NEGATIVE mg/dL
Hgb urine dipstick: NEGATIVE
Ketones, ur: NEGATIVE mg/dL
Leukocytes,Ua: NEGATIVE
Nitrite: NEGATIVE
PROTEIN: NEGATIVE mg/dL
SPECIFIC GRAVITY, URINE: 1.02 (ref 1.005–1.030)
Urobilinogen, UA: 0.2 mg/dL (ref 0.0–1.0)
pH: 7 (ref 5.0–8.0)

## 2018-08-03 NOTE — Progress Notes (Signed)
   Subjective:    Patient ID: [name], female    DOB: 23-Jul-1975, 43 y.o.   MRN: [id number]  HPI  43 yo married P4 here for a repeat pap. She had a LEEP done 10/19 and the margins were + for CIn 2&3. This is her first visit back since then.   She also has concerns about urge incontinence (urinary).  Review of Systems     Objective:   Physical Exam  Breathing, conversing, and ambulating normally Well nourished, well hydrated Black female, no apparent distress Abd- benign, obese Acanthosis nigricans noted on inner thighs Cervix- well healed, no visible lesions, IUD string noted (about 5 mm long)     Assessment & Plan:  + margins on LEEP- if her pap shows any abnormal cells, then I will repeat the LEEP She will get Gardasil and flu vaccines today I will get a HBA1C today and she will be seeing her primary care MD tomorrow Come back for Gardasil #2 in 2 months

## 2018-08-04 ENCOUNTER — Encounter: Payer: Self-pay | Admitting: Internal Medicine

## 2018-08-04 ENCOUNTER — Ambulatory Visit: Payer: Medicaid Other | Attending: Internal Medicine | Admitting: Internal Medicine

## 2018-08-04 VITALS — BP 117/85 | HR 85 | Temp 98.5°F | Resp 16 | Ht 62.0 in | Wt 279.4 lb

## 2018-08-04 DIAGNOSIS — Z79899 Other long term (current) drug therapy: Secondary | ICD-10-CM | POA: Diagnosis not present

## 2018-08-04 DIAGNOSIS — Z803 Family history of malignant neoplasm of breast: Secondary | ICD-10-CM | POA: Diagnosis not present

## 2018-08-04 DIAGNOSIS — E785 Hyperlipidemia, unspecified: Secondary | ICD-10-CM

## 2018-08-04 DIAGNOSIS — F419 Anxiety disorder, unspecified: Secondary | ICD-10-CM | POA: Diagnosis not present

## 2018-08-04 DIAGNOSIS — Z6841 Body Mass Index (BMI) 40.0 and over, adult: Secondary | ICD-10-CM | POA: Insufficient documentation

## 2018-08-04 DIAGNOSIS — I10 Essential (primary) hypertension: Secondary | ICD-10-CM | POA: Insufficient documentation

## 2018-08-04 DIAGNOSIS — Z793 Long term (current) use of hormonal contraceptives: Secondary | ICD-10-CM | POA: Insufficient documentation

## 2018-08-04 DIAGNOSIS — E119 Type 2 diabetes mellitus without complications: Secondary | ICD-10-CM | POA: Insufficient documentation

## 2018-08-04 DIAGNOSIS — Z833 Family history of diabetes mellitus: Secondary | ICD-10-CM | POA: Insufficient documentation

## 2018-08-04 DIAGNOSIS — Z87891 Personal history of nicotine dependence: Secondary | ICD-10-CM | POA: Insufficient documentation

## 2018-08-04 DIAGNOSIS — Z882 Allergy status to sulfonamides status: Secondary | ICD-10-CM | POA: Insufficient documentation

## 2018-08-04 DIAGNOSIS — Z888 Allergy status to other drugs, medicaments and biological substances status: Secondary | ICD-10-CM | POA: Diagnosis not present

## 2018-08-04 DIAGNOSIS — Z8249 Family history of ischemic heart disease and other diseases of the circulatory system: Secondary | ICD-10-CM | POA: Diagnosis not present

## 2018-08-04 LAB — HEMOGLOBIN A1C
ESTIMATED AVERAGE GLUCOSE: 134 mg/dL
Hgb A1c MFr Bld: 6.3 % — ABNORMAL HIGH (ref 4.8–5.6)

## 2018-08-04 LAB — GLUCOSE, POCT (MANUAL RESULT ENTRY): POC Glucose: 115 mg/dl — AB (ref 70–99)

## 2018-08-04 MED ORDER — ESCITALOPRAM OXALATE 10 MG PO TABS: 10.0000 mg | ORAL_TABLET | Freq: Every day | ORAL | 6 refills | Status: DC

## 2018-08-04 NOTE — Progress Notes (Signed)
Patient ID: Whitney White, female    DOB: 1975-09-04  MRN: [id number]  CC: Diabetes; Hypertension; and FMLA   Subjective: Whitney White is a 43 y.o. female with a past medical history of hypertension, anxiety/depression, GERD, DOE, DM II, mild hyperlipidemia and obesity. Her concerns today include:  FMLA papers and food security.  Hypertension  She is complaint with medications amlodipine and hydrochlorothiazide.  She tries to limit salt in the foods. She denies chest pain, shortness of breath. She complains of some pedal edema. She is a non-smoker  Mild Hyperlipidemia Complaint with atorvastatin. Denies adverse reaction from atorvastatin.  DM II: She has lost 10 pounds since October of last year.  She would like to eat healthier but has limited finances.  She has 4 children.  She is wanting to know the phone number and address for food banks in the area.   -She tries to stay active by walking up and down her flight of stairs 3 times every day. She does not consistently check her glucose. Range between 90-125 fasting when she does check. Eye exam: she is overdue, she was referred at last visit but did not go. No numbness or tingling in the feet.  Anxiety/Depression: She is working with a Social worker and says [place] works well for her anxiety and depression.  She needs refill as she has been out of it for 4 days.     Has FMLA form to be completed.  She was out of work for 1 week about a month ago due to the flu.  She is not planning any time off but needs the FMLA to cover her when she does have an acute illness and needs to be out of work.  Patient Active Problem List   Diagnosis Date Noted  . Periodic limb movement 03/10/2017  . RLS (restless legs syndrome) 01/26/2017  . Mild diastolic dysfunction 99/83/3825  . Prediabetes 11/26/2016  . Hyperlipidemia 11/26/2016  . Anxiety state, unspecified 12/22/2013  . Obesity, unspecified 12/22/2013  . Chest pain 12/17/2013  . Depression    . Hypertension   . Eczema   . Gestational hypertension   . GERD (gastroesophageal reflux disease)      Current Outpatient Medications on File Prior to Visit  Medication Sig Dispense Refill  . amLODipine (NORVASC) 5 MG tablet Take 1 tablet (5 mg total) by mouth daily. 90 tablet 3  . atorvastatin (LIPITOR) 10 MG tablet Take 1 tablet (10 mg total) by mouth daily. MUST MAKE APPT FOR FURTHER REFILLS 30 tablet 0  . Blood Glucose Monitoring Suppl (TRUE METRIX METER) w/Device KIT Use as directed 1 kit 0  . glucose blood (TRUE METRIX BLOOD GLUCOSE TEST) test strip Use as instructed 100 each 12  . hydrochlorothiazide (HYDRODIURIL) 25 MG tablet Take 1 tablet (25 mg total) by mouth daily. 30 tablet 5  . levonorgestrel (MIRENA) 20 MCG/24HR IUD 1 each by Intrauterine route once.    . TRUEPLUS LANCETS 28G MISC Use as directed 100 each 5   No current facility-administered medications on file prior to visit.     Allergies  Allergen Reactions  . Lisinopril Cough  . Metformin And Related     Caused palpitations/heart pounding    Social History   Socioeconomic History  . Marital status: Divorced    Spouse name: Not on file  . Number of children: Not on file  . Years of education: Not on file  . Highest education level: Not on file  Occupational History  .  Not on file  Social Needs  . Financial resource strain: Not on file  . Food insecurity:    Worry: Often true    Inability: Often true  . Transportation needs:    Medical: No    Non-medical: No  Tobacco Use  . Smoking status: Former Research scientist (life sciences)  . Smokeless tobacco: Never Used  Substance and Sexual Activity  . Alcohol use: No  . Drug use: No  . Sexual activity: Not on file    Comment: merania removed  Lifestyle  . Physical activity:    Days per week: Not on file    Minutes per session: Not on file  . Stress: Not on file  Relationships  . Social connections:    Talks on phone: Not on file    Gets together: Not on file    Attends  religious service: Not on file    Active member of club or organization: Not on file    Attends meetings of clubs or organizations: Not on file    Relationship status: Not on file  . Intimate partner violence:    Fear of current or ex partner: Not on file    Emotionally abused: Not on file    Physically abused: Not on file    Forced sexual activity: Not on file  Other Topics Concern  . Not on file  Social History Narrative  . Not on file    Family History  Problem Relation Age of Onset  . Hypertension Mother   . Stroke Mother   . Diabetes Maternal Grandmother   . Hypertension Maternal Grandmother   . Cancer Maternal Aunt        breast cancer   . Anesthesia problems Neg Hx     Past Surgical History:  Procedure Laterality Date  . NO PAST SURGERIES      ROS: Review of Systems  Constitutional: Negative for activity change, appetite change, chills, fatigue and fever.  Eyes: Negative for visual disturbance.  Respiratory: Negative for cough, shortness of breath and wheezing.   Cardiovascular: Positive for leg swelling. Negative for chest pain and palpitations.  Gastrointestinal: Negative for abdominal pain and nausea.  Endocrine: Negative for polydipsia, polyphagia and polyuria.  Musculoskeletal: Negative for myalgias.  Skin: Negative for color change, rash and wound.  Neurological: Negative for facial asymmetry, speech difficulty, weakness and numbness.  Psychiatric/Behavioral: Negative for agitation, self-injury and sleep disturbance. The patient is nervous/anxious.        Anxiety regarding food security   Negative except as stated above  PHYSICAL EXAM: BP 117/85   Pulse 85   Temp 98.5 F (36.9 C) (Oral)   Resp 16   Ht '5\' 2"'  (1.575 m)   Wt 279 lb 6.4 oz (126.7 kg)   SpO2 96%   BMI 51.10 kg/m   Wt Readings from Last 3 Encounters:  08/04/18 279 lb 6.4 oz (126.7 kg)  08/03/18 278 lb 11.2 oz (126.4 kg)  03/18/18 289 lb (131.1 kg)  General appearance - alert, well  appearing, and in no distress Mental status - normal mood, behavior, speech, dress, motor activity, and thought processes Mouth - mucous membranes moist, pharynx normal without lesions Neck - supple, no significant adenopathy Chest - clear to auscultation, no wheezes, rales or rhonchi, symmetric air entry Heart - normal rate, regular rhythm, normal S1, S2, no murmurs, rubs, clicks or gallops Extremities - peripheral pulses normal, no pedal edema, no clubbing or cyanosis Diabetic Foot Exam - Simple   Simple Foot Form  Visual Inspection No deformities, no ulcerations, no other skin breakdown bilaterally:  Yes Sensation Testing Intact to touch and monofilament testing bilaterally:  Yes Pulse Check Posterior Tibialis and Dorsalis pulse intact bilaterally:  Yes Comments Skin on both feet dry.     CMP Latest Ref Rng & Units 10/08/2017 08/24/2016 07/22/2014  Glucose 65 - 99 mg/dL 95 125(H) 111(H)  BUN 6 - 20 mg/dL '12 9 14  ' Creatinine 0.57 - 1.00 mg/dL 0.85 0.66 0.82  Sodium 134 - 144 mmol/L 142 138 139  Potassium 3.5 - 5.2 mmol/L 4.4 3.6 3.6  Chloride 96 - 106 mmol/L 100 106 107  CO2 20 - 29 mmol/L '25 26 26  ' Calcium 8.7 - 10.2 mg/dL 9.2 9.4 9.2  Total Protein 6.0 - 8.5 g/dL 7.2 8.0 -  Total Bilirubin 0.0 - 1.2 mg/dL <0.2 0.5 -  Alkaline Phos 39 - 117 IU/L 53 42 -  AST 0 - 40 IU/L 16 21 -  ALT 0 - 32 IU/L 15 17 -   Lipid Panel     Component Value Date/Time   CHOL 177 10/08/2017 0934   TRIG 107 10/08/2017 0934   HDL 39 (L) 10/08/2017 0934   CHOLHDL 4.5 (H) 10/08/2017 0934   CHOLHDL 4.5 01/06/2014 1241   VLDL 17 01/06/2014 1241   LDLCALC 117 (H) 10/08/2017 0934    CBC    Component Value Date/Time   WBC 8.8 10/08/2017 0934   WBC 8.1 08/24/2016 0042   RBC 4.98 10/08/2017 0934   RBC 5.06 08/24/2016 0042   HGB 12.2 10/08/2017 0934   HCT 39.2 10/08/2017 0934   PLT 418 (H) 10/08/2017 0934   MCV 79 10/08/2017 0934   MCH 24.5 (L) 10/08/2017 0934   MCH 24.1 (L) 08/24/2016 0042    MCHC 31.1 (L) 10/08/2017 0934   MCHC 31.4 08/24/2016 0042   RDW 16.0 (H) 10/08/2017 0934   LYMPHSABS 2.0 08/24/2016 0042   MONOABS 0.4 08/24/2016 0042   EOSABS 0.1 08/24/2016 0042   BASOSABS 0.0 08/24/2016 0042   Results for orders placed or performed in visit on 08/04/18  POCT glucose (manual entry)  Result Value Ref Range   POC Glucose 115 (A) 70 - 99 mg/dl   Lab Results  Component Value Date   HGBA1C 6.3 (H) 08/03/2018     ASSESSMENT AND PLAN:  1. Controlled type 2 diabetes mellitus without complication, without long-term current use of insulin ([place]) Controlled Patient given information on food pantries in the area.  Encouraged her to try to continue to remain active. Patient plans to call and schedule an eye appointment  2. Essential hypertension Controlled/ At Goal - Continue current medication regimen. - DASH and continuing to decrease weight discussed with patient.  3. Anxiety disorder, unspecified type - Anxiety exacerbated by stress of food insecurity and not having Lexapro since 08/01/2018. She is using outside counseling services. - Food Panties list provided to patient.  - Continue escitalopram 10 mg.   - escitalopram (LEXAPRO) 10 MG tablet; Take 1 tablet (10 mg total) by mouth daily.  Dispense: 30 tablet; Refill: 2  4. Morbid obesity due to excess calories ([place]) -See #1 above  5. Hyperlipidemia, unspecified hyperlipidemia type - Continue statin therapy.  I will complete her FMLA form and have my CMA call her when ready to pick up.  Patient was given the opportunity to ask questions.  Patient verbalized understanding of the plan and was able to repeat key elements of the plan.   Orders Placed  This Encounter  Procedures  . Microalbumin / creatinine urine ratio  . POCT glucose (manual entry)     Requested Prescriptions   Signed Prescriptions Disp Refills  . escitalopram (LEXAPRO) 10 MG tablet 30 tablet 6    Sig: Take 1 tablet (10 mg total) by mouth  daily.    No follow-ups on file.  Karle Plumber, MD, FACP

## 2018-08-05 LAB — MICROALBUMIN / CREATININE URINE RATIO
CREATININE, UR: 75.8 mg/dL
Microalb/Creat Ratio: 6 mg/g creat (ref 0–29)
Microalbumin, Urine: 4.3 ug/mL

## 2018-08-06 LAB — CYTOLOGY - PAP
Adequacy: ABSENT
Diagnosis: NEGATIVE
HPV (WINDOPATH): NOT DETECTED

## 2018-08-11 ENCOUNTER — Other Ambulatory Visit: Payer: Self-pay | Admitting: Internal Medicine

## 2018-08-11 ENCOUNTER — Encounter: Payer: Self-pay | Admitting: Internal Medicine

## 2018-08-11 ENCOUNTER — Telehealth: Payer: Self-pay | Admitting: Internal Medicine

## 2018-08-11 ENCOUNTER — Telehealth: Payer: Self-pay | Admitting: Family Medicine

## 2018-08-11 NOTE — Telephone Encounter (Signed)
Received new FMLA will give to pcp and once I receive it back will contact pt and fax it

## 2018-08-11 NOTE — Telephone Encounter (Signed)
Called patient to get more information about the FMLA paperwork that he had received in our office to get clarification on why we were completing them because patient is not pregnant nor did she had / have a surgery scheduled. Patient stated that they are for her LEEP procedure that she had and the future one that she will be possibly needing. Explained to patient that unfortunately we would not be able to complete the paperwork for a LEEP procedure and I understood that she may need another one but this does not require fmla/disability. Patient verbalized understanding and ask if I could shred that papers.

## 2018-08-11 NOTE — Telephone Encounter (Signed)
Pt came in to drop off new FMLA paper work please follow up once completed

## 2018-08-12 NOTE — Telephone Encounter (Signed)
I know that you an dr Laural Benes are working on this for the patient.

## 2018-08-18 ENCOUNTER — Telehealth: Payer: Self-pay

## 2018-08-18 NOTE — Telephone Encounter (Signed)
Sent message through My Chart

## 2018-09-12 NOTE — Telephone Encounter (Signed)
done

## 2018-10-01 ENCOUNTER — Other Ambulatory Visit: Payer: Self-pay | Admitting: Internal Medicine

## 2018-10-01 DIAGNOSIS — I10 Essential (primary) hypertension: Secondary | ICD-10-CM

## 2018-10-05 ENCOUNTER — Other Ambulatory Visit: Payer: Self-pay

## 2018-10-05 ENCOUNTER — Ambulatory Visit (INDEPENDENT_AMBULATORY_CARE_PROVIDER_SITE_OTHER): Payer: Medicaid Other | Admitting: *Deleted

## 2018-10-05 VITALS — BP 150/80 | HR 90 | Ht 62.0 in | Wt 282.2 lb

## 2018-10-05 DIAGNOSIS — Z0000 Encounter for general adult medical examination without abnormal findings: Secondary | ICD-10-CM

## 2018-10-05 DIAGNOSIS — Z23 Encounter for immunization: Secondary | ICD-10-CM

## 2018-10-05 NOTE — Progress Notes (Signed)
Whitney White here for Gardasil  Injection.  Injection administered without complication. Patient will return in 4 months for next injection.  Osvaldo Human, RN 10/05/2018  2:01 PM

## 2018-10-07 NOTE — Progress Notes (Signed)
I have reviewed this chart and agree with the RN/CMA assessment and management.    Nicoli Nardozzi C Ewa Hipp, MD, FACOG Attending Physician, [facility]  

## 2018-11-05 ENCOUNTER — Ambulatory Visit: Payer: Medicaid Other | Attending: Internal Medicine | Admitting: Internal Medicine

## 2018-11-05 ENCOUNTER — Other Ambulatory Visit: Payer: Self-pay

## 2018-11-05 ENCOUNTER — Encounter: Payer: Self-pay | Admitting: Internal Medicine

## 2018-11-05 DIAGNOSIS — I10 Essential (primary) hypertension: Secondary | ICD-10-CM

## 2018-11-05 DIAGNOSIS — E119 Type 2 diabetes mellitus without complications: Secondary | ICD-10-CM | POA: Diagnosis not present

## 2018-11-05 DIAGNOSIS — F419 Anxiety disorder, unspecified: Secondary | ICD-10-CM

## 2018-11-05 DIAGNOSIS — Z9181 History of falling: Secondary | ICD-10-CM

## 2018-11-05 DIAGNOSIS — E782 Mixed hyperlipidemia: Secondary | ICD-10-CM

## 2018-11-05 MED ORDER — ATORVASTATIN CALCIUM 10 MG PO TABS: 10.0000 mg | ORAL_TABLET | Freq: Every day | ORAL | 3 refills | Status: DC

## 2018-11-05 MED ORDER — AMLODIPINE BESYLATE 5 MG PO TABS: 5.0000 mg | ORAL_TABLET | Freq: Every day | ORAL | 3 refills | Status: DC

## 2018-11-05 MED ORDER — HYDROCHLOROTHIAZIDE 25 MG PO TABS: 25.0000 mg | ORAL_TABLET | Freq: Every day | ORAL | 3 refills | Status: DC

## 2018-11-05 NOTE — Progress Notes (Signed)
Pt states her blood sugar this morning was 109  

## 2018-11-05 NOTE — Progress Notes (Signed)
Virtual Visit via Telephone Note Due to current restrictions/limitations of in-office visits due to the COVID-19 pandemic, this scheduled clinical appointment was converted to a telehealth visit  I connected with Whitney White on 11/05/18 at 1:38 p.m EDT by telephone and verified that I am speaking with the correct person using two identifiers. I am in my office.  The patient is at home.  Only the patient and myself participated in this encounter.  I discussed the limitations, risks, security and privacy concerns of performing an evaluation and management service by telephone and the availability of in person appointments. I also discussed with the patient that there may be a patient responsible charge related to this service. The patient expressed understanding and agreed to proceed.   History of Present Illness: Pt with hx of HTN, anxiety/depression, GERD, DOE, DM II, HL and obesity.  Patient was last seen 08/2018.  Purpose of today's visit is chronic disease management follow-up.   GYN:  Reports pap nl.  F/u 02/2019  DIABETES TYPE 2/Obesity Last A1C:   Results for orders placed or performed in visit on 08/04/18  Microalbumin / creatinine urine ratio  Result Value Ref Range   Creatinine, Urine 75.8 Not Estab. mg/dL   Microalbumin, Urine 4.3 Not Estab. ug/mL   Microalb/Creat Ratio 6 0 - 29 mg/g creat  POCT glucose (manual entry)  Result Value Ref Range   POC Glucose 115 (A) 70 - 99 mg/dl    Med Adherence:  Not on any med Medication side effects:  []  Yes    []  No Home Monitoring?  [x]  Yes, TID     Home glucose results range:118-121 Diet Adherence: [x]  Yes - doing better since working from home.    Exercise: [x]  Yes - walking twice a day in her neighborhood     Hypoglycemic episodes?: []  Yes    [x]  No Numbness of the feet? []  Yes    [x]  No Retinopathy hx? []  Yes    [x]  No Last eye exam:  Over due- did not get to schedule before COVID Comments:  Have a scale but has not used it. Last  wgh was 282 lbs about 1.5 wks ago on gyn visit; up 3 lbs since last visit  HYPERTENSION Currently taking: see medication list - HCTZ, Norvasc Med Adherence: [x]  Yes    []  No Medication side effects: []  Yes    [x]  No Adherence with salt restriction: [x]  Yes    []  No Home Monitoring?: []  Yes    [x]  No - no device Monitoring Frequency: []  Yes    []  No Home BP results range: []  Yes    []  No SOB? []  Yes    [x]  No Chest Pain?: []  Yes    [x]  No Leg swelling?: []  Yes    [x]  No Headaches?: []  Yes    [x]  No Dizziness? []  Yes    [x]  No Comments:   Dep/Anx:  Feels overwhelm at times working from home and has 4 kids at home 2 of which  Have autism spectrum.  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 They do get counseling regularly and she finds this helpful.  Feels she is doing okay on Lexapro.  Has fallen 2-3 times in last 3 mths.  Last episode was last wk when she tripped on a blanket that was on the floor. One episode she slid when stepping out of shower.  No LOC or dizziness.  Does not feel balance is  off.    Observations/Objective: No direct observation done as this was a telephone encounter. Results for orders placed or performed in visit on 08/04/18  Microalbumin / creatinine urine ratio  Result Value Ref Range   Creatinine, Urine 75.8 Not Estab. mg/dL   Microalbumin, Urine 4.3 Not Estab. ug/mL   Microalb/Creat Ratio 6 0 - 29 mg/g creat  POCT glucose (manual entry)  Result Value Ref Range   POC Glucose 115 (A) 70 - 99 mg/dl     Chemistry      Component Value Date/Time   NA 142 10/08/2017 0934   K 4.4 10/08/2017 0934   CL 100 10/08/2017 0934   CO2 25 10/08/2017 0934   BUN 12 10/08/2017 0934   CREATININE 0.85 10/08/2017 0934      Component Value Date/Time   CALCIUM 9.2 10/08/2017 0934   ALKPHOS 53 10/08/2017 0934   AST 16 10/08/2017 0934   ALT 15 10/08/2017 0934   BILITOT <0.2 10/08/2017 0934      Assessment and Plan: 1. Controlled type 2  diabetes mellitus without complication, without long-term current use of insulin (HCC) Encouraged her to continue healthy eating habits and regular exercise.  Dietary counseling given. She plans to try schedule an eye appointment sometime within the next 1 to 3 months - CBC; Future - Lipid panel; Future - Comprehensive metabolic panel; Future  2. Essential hypertension Level of control unknown. Advised to continue current medications and low-salt diet - amLODipine (NORVASC) 5 MG tablet; Take 1 tablet (5 mg total) by mouth daily.  Dispense: 90 tablet; Refill: 3 - hydrochlorothiazide (HYDRODIURIL) 25 MG tablet; Take 1 tablet (25 mg total) by mouth daily.  Dispense: 90 tablet; Refill: 3  3. Morbid obesity due to excess calories (HCC) See #1 above  4. Anxiety disorder, unspecified type Continue Lexapro  5. History of recent fall At least 2/3 episodes were due to tripping over an object or slipping because the foot was wet.  She does not describe any feeling of being off balance.  Advised her to be careful with her footing.  If she continues to have episodes of fall, advised her to follow-up in person for further evaluation  6. Mixed hyperlipidemia - atorvastatin (LIPITOR) 10 MG tablet; Take 1 tablet (10 mg total) by mouth daily.  Dispense: 90 tablet; Refill: 3   Follow Up Instructions: Follow-up in 3 months. Patient to come to the lab sometime over the next week to have her routine blood test done.   I discussed the assessment and treatment plan with the patient. The patient was provided an opportunity to ask questions and all were answered. The patient agreed with the plan and demonstrated an understanding of the instructions.   The patient was advised to call back or seek an in-person evaluation if the symptoms worsen or if the condition fails to improve as anticipated.  I provided 17 minutes of non-face-to-face time during this encounter.   Jonah Duarte, MD

## 2018-11-12 ENCOUNTER — Ambulatory Visit: Payer: Medicaid Other | Attending: Family Medicine

## 2018-11-12 ENCOUNTER — Other Ambulatory Visit: Payer: Self-pay

## 2018-11-12 DIAGNOSIS — E119 Type 2 diabetes mellitus without complications: Secondary | ICD-10-CM

## 2018-11-13 ENCOUNTER — Encounter: Payer: Self-pay | Admitting: Internal Medicine

## 2018-11-13 LAB — COMPREHENSIVE METABOLIC PANEL
ALT: 15 IU/L (ref 0–32)
AST: 20 IU/L (ref 0–40)
Albumin/Globulin Ratio: 1.5 (ref 1.2–2.2)
Albumin: 4.5 g/dL (ref 3.8–4.8)
Alkaline Phosphatase: 64 IU/L (ref 39–117)
BUN/Creatinine Ratio: 10 (ref 9–23)
BUN: 9 mg/dL (ref 6–24)
Bilirubin Total: 0.4 mg/dL (ref 0.0–1.2)
CO2: 27 mmol/L (ref 20–29)
Calcium: 9.7 mg/dL (ref 8.7–10.2)
Chloride: 95 mmol/L — ABNORMAL LOW (ref 96–106)
Creatinine, Ser: 0.91 mg/dL (ref 0.57–1.00)
GFR calc Af Amer: 91 mL/min/{1.73_m2} (ref >59)
GFR calc non Af Amer: 79 mL/min/{1.73_m2} (ref >59)
Globulin, Total: 3.1 g/dL (ref 1.5–4.5)
Glucose: 104 mg/dL — ABNORMAL HIGH (ref 65–99)
Potassium: 3.6 mmol/L (ref 3.5–5.2)
Sodium: 138 mmol/L (ref 134–144)
Total Protein: 7.6 g/dL (ref 6.0–8.5)

## 2018-11-13 LAB — LIPID PANEL
Chol/HDL Ratio: 6.2 ratio — ABNORMAL HIGH (ref 0.0–4.4)
Cholesterol, Total: 148 mg/dL (ref 100–199)
HDL: 24 mg/dL — ABNORMAL LOW (ref >39)
LDL Calculated: 88 mg/dL (ref 0–99)
Triglycerides: 180 mg/dL — ABNORMAL HIGH (ref 0–149)
VLDL Cholesterol Cal: 36 mg/dL (ref 5–40)

## 2018-11-13 LAB — CBC
Hematocrit: 43.4 % (ref 34.0–46.6)
Hemoglobin: 13.5 g/dL (ref 11.1–15.9)
MCH: 24.7 pg — ABNORMAL LOW (ref 26.6–33.0)
MCHC: 31.1 g/dL — ABNORMAL LOW (ref 31.5–35.7)
MCV: 79 fL (ref 79–97)
Platelets: 393 10*3/uL (ref 150–450)
RBC: 5.47 x10E6/uL — ABNORMAL HIGH (ref 3.77–5.28)
RDW: 14.5 % (ref 11.7–15.4)
WBC: 8.4 10*3/uL (ref 3.4–10.8)

## 2019-01-05 ENCOUNTER — Ambulatory Visit (INDEPENDENT_AMBULATORY_CARE_PROVIDER_SITE_OTHER): Payer: Medicaid Other

## 2019-01-05 ENCOUNTER — Other Ambulatory Visit: Payer: Self-pay

## 2019-01-05 VITALS — BP 129/92 | HR 75 | Wt 277.7 lb

## 2019-01-05 DIAGNOSIS — Z23 Encounter for immunization: Secondary | ICD-10-CM

## 2019-01-05 NOTE — Progress Notes (Signed)
Pt here today for 3rd Gardadil injection.  Pt tolerated well. Pt advised to f/u as needed for GYN care.  Pt verbalized understanding.   Mel Almond, RN 01/05/19

## 2019-01-06 NOTE — Progress Notes (Signed)
Chart reviewed for nurse visit. Agree with plan of care.   Starr Lake, CNM 01/06/2019 1:56 PM

## 2019-02-05 ENCOUNTER — Ambulatory Visit: Payer: Medicaid Other | Admitting: Internal Medicine

## 2019-02-05 ENCOUNTER — Ambulatory Visit: Payer: Medicaid Other

## 2019-03-12 ENCOUNTER — Ambulatory Visit: Payer: Medicaid Other | Attending: Internal Medicine | Admitting: Internal Medicine

## 2019-03-12 ENCOUNTER — Encounter: Payer: Self-pay | Admitting: Internal Medicine

## 2019-03-12 ENCOUNTER — Other Ambulatory Visit: Payer: Self-pay

## 2019-03-12 VITALS — Wt 272.0 lb

## 2019-03-12 DIAGNOSIS — Z6841 Body Mass Index (BMI) 40.0 and over, adult: Secondary | ICD-10-CM | POA: Diagnosis not present

## 2019-03-12 DIAGNOSIS — E119 Type 2 diabetes mellitus without complications: Secondary | ICD-10-CM

## 2019-03-12 DIAGNOSIS — I10 Essential (primary) hypertension: Secondary | ICD-10-CM | POA: Diagnosis not present

## 2019-03-12 DIAGNOSIS — E1169 Type 2 diabetes mellitus with other specified complication: Secondary | ICD-10-CM | POA: Diagnosis not present

## 2019-03-12 DIAGNOSIS — E785 Hyperlipidemia, unspecified: Secondary | ICD-10-CM | POA: Diagnosis not present

## 2019-03-12 NOTE — Progress Notes (Signed)
Virtual Visit via Telephone Note Due to current restrictions/limitations of in-office visits due to the COVID-19 pandemic, this scheduled clinical appointment was converted to a telehealth visit  I connected with Whitney White on 03/12/19 at  4:57 p.m by telephone and verified that I am speaking with the correct person using two identifiers. I am in my office.  The patient is at home.  Only the patient and myself participated in this encounter.  I discussed the limitations, risks, security and privacy concerns of performing an evaluation and management service by telephone and the availability of in person appointments. I also discussed with the patient that there may be a patient responsible charge related to this service. The patient expressed understanding and agreed to proceed.   History of Present Illness: Pt with hx of HTN, anxiety/depression, GERD, DOE, DM II, HL and obesity   DM/Obesity:  BS this afternoon was 116. Checks 3-4 x a day.  Highest 120.   Pt is diet control.  Doing well with diet 0 no bread and less sugar Walking 1 mile daily.   Has loss 17 lbs over the past 6 wks.  Last wgh she reports at 272 lbs Over due for eye exam.  She plans to call and schedule Has appt at [facility] tomorrow for flu shot  HTN:  Reports compliance with meds. Limits salt in foods.  No CP/SOB  HL:  She has been more consistent with taking Lipitor Outpatient Encounter Medications as of 03/12/2019  Medication Sig  . amLODipine (NORVASC) 5 MG tablet Take 1 tablet (5 mg total) by mouth daily.  Marland Kitchen atorvastatin (LIPITOR) 10 MG tablet Take 1 tablet (10 mg total) by mouth daily.  . Blood Glucose Monitoring Suppl (TRUE METRIX METER) w/Device KIT Use as directed  . escitalopram (LEXAPRO) 10 MG tablet Take 1 tablet (10 mg total) by mouth daily.  Marland Kitchen glucose blood (TRUE METRIX BLOOD GLUCOSE TEST) test strip Use as instructed  . hydrochlorothiazide (HYDRODIURIL) 25 MG tablet Take 1 tablet (25 mg total) by mouth daily.   Marland Kitchen levonorgestrel (MIRENA) 20 MCG/24HR IUD 1 each by Intrauterine route once.  . TRUEPLUS LANCETS 28G MISC Use as directed   No facility-administered encounter medications on file as of 03/12/2019.       Observations/Objective: No direct observation done as this was a telephone encounter  Results for orders placed or performed in visit on 11/12/18  Comprehensive metabolic panel  Result Value Ref Range   Glucose 104 (H) 65 - 99 mg/dL   BUN 9 6 - 24 mg/dL   Creatinine, Ser 0.91 0.57 - 1.00 mg/dL   GFR calc non Af Amer 79 >59 mL/min/1.73   GFR calc Af Amer 91 >59 mL/min/1.73   BUN/Creatinine Ratio 10 9 - 23   Sodium 138 134 - 144 mmol/L   Potassium 3.6 3.5 - 5.2 mmol/L   Chloride 95 (L) 96 - 106 mmol/L   CO2 27 20 - 29 mmol/L   Calcium 9.7 8.7 - 10.2 mg/dL   Total Protein 7.6 6.0 - 8.5 g/dL   Albumin 4.5 3.8 - 4.8 g/dL   Globulin, Total 3.1 1.5 - 4.5 g/dL   Albumin/Globulin Ratio 1.5 1.2 - 2.2   Bilirubin Total 0.4 0.0 - 1.2 mg/dL   Alkaline Phosphatase 64 39 - 117 IU/L   AST 20 0 - 40 IU/L   ALT 15 0 - 32 IU/L  Lipid panel  Result Value Ref Range   Cholesterol, Total 148 100 - 199 mg/dL  Triglycerides 180 (H) 0 - 149 mg/dL   HDL 24 (L) >39 mg/dL   VLDL Cholesterol Cal 36 5 - 40 mg/dL   LDL Calculated 88 0 - 99 mg/dL   Chol/HDL Ratio 6.2 (H) 0.0 - 4.4 ratio  CBC  Result Value Ref Range   WBC 8.4 3.4 - 10.8 x10E3/uL   RBC 5.47 (H) 3.77 - 5.28 x10E6/uL   Hemoglobin 13.5 11.1 - 15.9 g/dL   Hematocrit 43.4 34.0 - 46.6 %   MCV 79 79 - 97 fL   MCH 24.7 (L) 26.6 - 33.0 pg   MCHC 31.1 (L) 31.5 - 35.7 g/dL   RDW 14.5 11.7 - 15.4 %   Platelets 393 150 - 450 x10E3/uL     Assessment and Plan: 1. Controlled type 2 diabetes mellitus without complication, without long-term current use of insulin (HCC) Blood sugars controlled without medication.  Advised patient that she can cut back to checking blood sugars just once a day instead of 4 times a day.  Encouraged her to continue  healthy eating habits.  Commended her on regular exercise and encouraged her to continue doing that. Encouraged her to schedule and have her eye appointment before the end of the year. She plans [facility]  I have requested that she have them send me notification so that I can update her health maintenance. - Hemoglobin A1c; Future  2. Essential hypertension Continue amlodipine and hydrochlorothiazide.  3. Hyperlipidemia associated with type 2 diabetes mellitus ([place]) LDL was not at goal on blood test that was done back in June.  However patient told me through my chart that she was not taking the Lipitor consistently at that time but has been doing so now  4. Class 3 severe obesity due to excess calories with serious comorbidity and body mass index (BMI) of 45.0 to 49.9 in adult [facility]) See #1 above   Follow Up Instructions: Follow-up in 3 months in person   I discussed the assessment and treatment plan with the patient. The patient was provided an opportunity to ask questions and all were answered. The patient agreed with the plan and demonstrated an understanding of the instructions.   The patient was advised to call back or seek an in-person evaluation if the symptoms worsen or if the condition fails to improve as anticipated.  I provided 12 minutes of non-face-to-face time during this encounter.   Karle Plumber, MD

## 2019-03-12 NOTE — Progress Notes (Signed)
Patient verified DOB Patient has eaten today and has taken medication today. Patient denies pain at this time. CBG today: 122 before eating

## 2019-04-02 ENCOUNTER — Encounter: Payer: Self-pay | Admitting: Internal Medicine

## 2019-04-02 DIAGNOSIS — F329 Major depressive disorder, single episode, unspecified: Secondary | ICD-10-CM

## 2019-04-02 DIAGNOSIS — F32A Depression, unspecified: Secondary | ICD-10-CM

## 2019-04-20 ENCOUNTER — Institutional Professional Consult (permissible substitution): Payer: Medicaid Other | Admitting: Licensed Clinical Social Worker

## 2019-04-27 ENCOUNTER — Ambulatory Visit (INDEPENDENT_AMBULATORY_CARE_PROVIDER_SITE_OTHER): Payer: Medicaid Other | Admitting: Licensed Clinical Social Worker

## 2019-04-27 DIAGNOSIS — F331 Major depressive disorder, recurrent, moderate: Secondary | ICD-10-CM

## 2019-04-27 DIAGNOSIS — F419 Anxiety disorder, unspecified: Secondary | ICD-10-CM

## 2019-05-07 ENCOUNTER — Telehealth: Payer: Self-pay | Admitting: Licensed Clinical Social Worker

## 2019-05-07 NOTE — BH Specialist Note (Signed)
Integrated Behavioral Health Visit via Telemedicine (Telephone)  04/27[birth year]2019 Whitney White [id number]   Session Start time: 4:30 PM  Session End time: 5:15 PM Total time: 42   Referring Provider: Dr. Wynetta Emery Type of Visit: Telephonic Patient location: Home [facility] Provider location: Office All persons participating in visit: LCSW and Patient  Confirmed patient's address: Yes  Confirmed patient's phone number: Yes  Any changes to demographics: No   Confirmed patient's insurance: Yes  Any changes to patient's insurance: No   Discussed confidentiality: Yes    The following statements were read to the patient and/or legal guardian that are established with the [facility].  "The purpose of this phone visit is to provide behavioral health care while limiting exposure to the coronavirus (COVID19).  There is a possibility of technology failure and discussed alternative modes of communication if that failure occurs."  "By engaging in this telephone visit, you consent to the provision of healthcare.  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  Reduce symptoms of: agitation, anxiety, depression and stress  2.  Increase knowledge and/or ability of: coping skills and healthy habits  3.  Demonstrate ability to: Increase healthy  adjustment to current life circumstances, Increase adequate support systems for patient/family and Decrease self-medicating behaviors  INTERVENTIONS: Interventions utilized:  Solution-Focused Strategies, Supportive Counseling, Psychoeducation and/or Health Education and Link to [employer] Standardized Assessments completed: Not Needed  ASSESSMENT: Patient currently experiencing depression and anxiety triggered by psychosocial stressors. Pt reports increase in substance use (marijuana and alcohol) to cope with stressors.   Patient may benefit from medication management and therapy. LCSW provided support and validation. Healthy coping skills were discussed and supportive community resources were provided (Out of the [facility])   Pt has not been contacted by [facility] as of yet. Pt provided verbal consent for LCSW to complete referral to [place].  PLAN: 1. Follow up with behavioral health clinician on : Contact LCSW with any additional behavioral health and/or resource needs 2. Behavioral recommendations: Utilize healthy coping skills and community resources provided 3. Referral(s): Armed forces logistics/support/administrative officer (LME/Outside Clinic) and Community Resources:  Food  Rebekah [place], [place] 05/07/2019 11:29 AM

## 2019-05-07 NOTE — Telephone Encounter (Signed)
Completed referral to [facility] faxed.

## 2019-05-11 ENCOUNTER — Encounter: Payer: Self-pay | Admitting: Internal Medicine

## 2019-05-13 ENCOUNTER — Telehealth: Payer: Self-pay | Admitting: Licensed Clinical Social Worker

## 2019-05-13 NOTE — Telephone Encounter (Signed)
LCSW received correspondence from [facility] that pt has a scheduled appt with them on 06/06/2018

## 2019-05-16 ENCOUNTER — Other Ambulatory Visit: Payer: Self-pay | Admitting: Internal Medicine

## 2019-05-16 ENCOUNTER — Encounter: Payer: Self-pay | Admitting: Internal Medicine

## 2019-05-16 DIAGNOSIS — F419 Anxiety disorder, unspecified: Secondary | ICD-10-CM

## 2019-05-16 MED ORDER — ESCITALOPRAM OXALATE 10 MG PO TABS: 10.0000 mg | ORAL_TABLET | Freq: Every day | ORAL | 6 refills | Status: DC

## 2019-06-07 DIAGNOSIS — F411 Generalized anxiety disorder: Secondary | ICD-10-CM | POA: Diagnosis not present

## 2019-06-07 DIAGNOSIS — F332 Major depressive disorder, recurrent severe without psychotic features: Secondary | ICD-10-CM | POA: Diagnosis not present

## 2019-07-01 DIAGNOSIS — F332 Major depressive disorder, recurrent severe without psychotic features: Secondary | ICD-10-CM | POA: Diagnosis not present

## 2019-07-01 DIAGNOSIS — F411 Generalized anxiety disorder: Secondary | ICD-10-CM | POA: Diagnosis not present

## 2019-08-26 DIAGNOSIS — F411 Generalized anxiety disorder: Secondary | ICD-10-CM | POA: Diagnosis not present

## 2019-08-26 DIAGNOSIS — F332 Major depressive disorder, recurrent severe without psychotic features: Secondary | ICD-10-CM | POA: Diagnosis not present

## 2019-10-19 DIAGNOSIS — F411 Generalized anxiety disorder: Secondary | ICD-10-CM | POA: Diagnosis not present

## 2019-10-19 DIAGNOSIS — F332 Major depressive disorder, recurrent severe without psychotic features: Secondary | ICD-10-CM | POA: Diagnosis not present

## 2019-11-15 ENCOUNTER — Other Ambulatory Visit: Payer: Self-pay | Admitting: Internal Medicine

## 2019-11-15 DIAGNOSIS — E782 Mixed hyperlipidemia: Secondary | ICD-10-CM

## 2019-12-09 ENCOUNTER — Other Ambulatory Visit: Payer: Self-pay

## 2019-12-09 ENCOUNTER — Encounter: Payer: Self-pay | Admitting: Internal Medicine

## 2019-12-09 DIAGNOSIS — I10 Essential (primary) hypertension: Secondary | ICD-10-CM

## 2019-12-09 MED ORDER — AMLODIPINE BESYLATE 5 MG PO TABS: 5.0000 mg | ORAL_TABLET | Freq: Every day | ORAL | 0 refills | Status: DC

## 2019-12-09 MED ORDER — HYDROCHLOROTHIAZIDE 25 MG PO TABS: 25.0000 mg | ORAL_TABLET | Freq: Every day | ORAL | 0 refills | Status: DC

## 2019-12-27 ENCOUNTER — Other Ambulatory Visit: Payer: Self-pay

## 2019-12-27 ENCOUNTER — Ambulatory Visit: Payer: Medicaid Other | Attending: Family | Admitting: Family

## 2019-12-27 ENCOUNTER — Encounter: Payer: Self-pay | Admitting: Family

## 2019-12-27 VITALS — BP 121/83 | HR 87 | Resp 16 | Ht 61.0 in | Wt 296.6 lb

## 2019-12-27 DIAGNOSIS — E1169 Type 2 diabetes mellitus with other specified complication: Secondary | ICD-10-CM | POA: Diagnosis not present

## 2019-12-27 DIAGNOSIS — I10 Essential (primary) hypertension: Secondary | ICD-10-CM

## 2019-12-27 DIAGNOSIS — Z6841 Body Mass Index (BMI) 40.0 and over, adult: Secondary | ICD-10-CM | POA: Diagnosis not present

## 2019-12-27 DIAGNOSIS — E119 Type 2 diabetes mellitus without complications: Secondary | ICD-10-CM | POA: Diagnosis not present

## 2019-12-27 DIAGNOSIS — F419 Anxiety disorder, unspecified: Secondary | ICD-10-CM

## 2019-12-27 DIAGNOSIS — E785 Hyperlipidemia, unspecified: Secondary | ICD-10-CM

## 2019-12-27 LAB — POCT GLYCOSYLATED HEMOGLOBIN (HGB A1C): HbA1c, POC (prediabetic range): 6.4 % (ref 5.7–6.4)

## 2019-12-27 LAB — GLUCOSE, POCT (MANUAL RESULT ENTRY): POC Glucose: 156 mg/dL — AB (ref 70–99)

## 2019-12-27 MED ORDER — TRUEPLUS LANCETS 28G MISC: 5 refills | Status: AC

## 2019-12-27 MED ORDER — TRUE METRIX BLOOD GLUCOSE TEST VI STRP: ORAL_STRIP | 12 refills | Status: DC

## 2019-12-27 MED ORDER — AMLODIPINE BESYLATE 5 MG PO TABS: 5.0000 mg | ORAL_TABLET | Freq: Every day | ORAL | 2 refills | Status: DC

## 2019-12-27 MED ORDER — HYDROCHLOROTHIAZIDE 25 MG PO TABS: 25.0000 mg | ORAL_TABLET | Freq: Every day | ORAL | 2 refills | Status: DC

## 2019-12-27 NOTE — Patient Instructions (Addendum)
Continue Amlodipine and Hydrochlorothiazide for high blood pressure. Continue Atorvastatin for cholesterol. DASH diet and moderate-intensity exercise as tolerated for pre-diabetes. Lab today. Follow-up with primary physician in 3 months or sooner if needed. Hypertension, Adult Hypertension is another name for high blood pressure. High blood pressure forces your heart to work harder to pump blood. This can cause problems over time. There are two numbers in a blood pressure reading. There is a top number (systolic) over a bottom number (diastolic). It is best to have a blood pressure that is below 120/80. Healthy choices can help lower your blood pressure, or you may need medicine to help lower it. What are the causes? The cause of this condition is not known. Some conditions may be related to high blood pressure. What increases the risk?  Smoking.  Having type 2 diabetes mellitus, high cholesterol, or both.  Not getting enough exercise or physical activity.  Being overweight.  Having too much fat, sugar, calories, or salt (sodium) in your diet.  Drinking too much alcohol.  Having long-term (chronic) kidney disease.  Having a family history of high blood pressure.  Age. Risk increases with age.  Race. You may be at higher risk if you are African American.  Gender. Men are at higher risk than women before age 75. After age 45, women are at higher risk than men.  Having obstructive sleep apnea.  Stress. What are the signs or symptoms?  High blood pressure may not cause symptoms. Very high blood pressure (hypertensive crisis) may cause: ? Headache. ? Feelings of worry or nervousness (anxiety). ? Shortness of breath. ? Nosebleed. ? A feeling of being sick to your stomach (nausea). ? Throwing up (vomiting). ? Changes in how you see. ? Very bad chest pain. ? Seizures. How is this treated?  This condition is treated by making healthy lifestyle changes, such as: ? Eating healthy  foods. ? Exercising more. ? Drinking less alcohol.  Your health care provider may prescribe medicine if lifestyle changes are not enough to get your blood pressure under control, and if: ? Your top number is above 130. ? Your bottom number is above 80.  Your personal target blood pressure may vary. Follow these instructions at home: Eating and drinking   If told, follow the DASH eating plan. To follow this plan: ? Fill one half of your plate at each meal with fruits and vegetables. ? Fill one fourth of your plate at each meal with whole grains. Whole grains include whole-wheat pasta, brown rice, and whole-grain bread. ? Eat or drink low-fat dairy products, such as skim milk or low-fat yogurt. ? Fill one fourth of your plate at each meal with low-fat (lean) proteins. Low-fat proteins include fish, chicken without skin, eggs, beans, and tofu. ? Avoid fatty meat, cured and processed meat, or chicken with skin. ? Avoid pre-made or processed food.  Eat less than 1,500 mg of salt each day.  Do not drink alcohol if: ? Your doctor tells you not to drink. ? You are pregnant, may be pregnant, or are planning to become pregnant.  If you drink alcohol: ? Limit how much you use to:  0-1 drink a day for women.  0-2 drinks a day for men. ? Be aware of how much alcohol is in your drink. In the [place] one drink equals one 12 oz bottle of beer (355 mL), one 5 oz glass of wine (148 mL), or one 1 oz glass of hard liquor (44 mL). Lifestyle  Work with your doctor to stay at a healthy weight or to lose weight. Ask your doctor what the best weight is for you.  Get at least 30 minutes of exercise most days of the week. This may include walking, swimming, or biking.  Get at least 30 minutes of exercise that strengthens your muscles (resistance exercise) at least 3 days a week. This may include lifting weights or doing Pilates.  Do not use any products that contain nicotine or tobacco, such as  cigarettes, e-cigarettes, and chewing tobacco. If you need help quitting, ask your doctor.  Check your blood pressure at home as told by your doctor.  Keep all follow-up visits as told by your doctor. This is important. Medicines  Take over-the-counter and prescription medicines only as told by your doctor. Follow directions carefully.  Do not skip doses of blood pressure medicine. The medicine does not work as well if you skip doses. Skipping doses also puts you at risk for problems.  Ask your doctor about side effects or reactions to medicines that you should watch for. Contact a doctor if you:  Think you are having a reaction to the medicine you are taking.  Have headaches that keep coming back (recurring).  Feel dizzy.  Have swelling in your ankles.  Have trouble with your vision. Get help right away if you:  Get a very bad headache.  Start to feel mixed up (confused).  Feel weak or numb.  Feel faint.  Have very bad pain in your: ? Chest. ? Belly (abdomen).  Throw up more than once.  Have trouble breathing. Summary  Hypertension is another name for high blood pressure.  High blood pressure forces your heart to work harder to pump blood.  For most people, a normal blood pressure is less than 120/80.  Making healthy choices can help lower blood pressure. If your blood pressure does not get lower with healthy choices, you may need to take medicine. This information is not intended to replace advice given to you by your health care provider. Make sure you discuss any questions you have with your health care provider. Document Revised: 01/28/2018 Document Reviewed: 01/28/2018 Elsevier Patient Education  2020 [employer].

## 2019-12-27 NOTE — Progress Notes (Signed)
Patient ID: Whitney White, female    DOB: 1975/08/09  MRN: [id number]  CC: Chronic Conditions Follow-Up  Subjective: Whitney White is a 44 y.o. female with history of hypertension, GERD, anxiety state, depression, restless leg syndrome, hyperlipidemia, and diabetes who presents for chronic conditions follow-up.  1. DIABETES TYPE 2 FOLLOW-UP: Last A1C: 6.4% on 12/27/2019 Are you fasting today: '[x]'  Yes '[]'  No  Home Monitoring?  '[]'  Yes    '[x]'  No Diet Adherence: '[]'  Yes    '[x]'  No Exercise: '[x]'  Yes, walks 2 miles 3 days weekly Last eye exam: Reports she has not had this done since last visit and plans to go soon as she has her own preferred ophthalmologist. Comments: Last visit 03/12/2019 with Dr. Wynetta Emery. During that encounter blood sugars controlled without medication.   2. HYPERTENSION FOLLOW-UP: Currently taking: see medication list Have you taken your blood pressure medication today: '[x]'  Yes '[]'  No  Med Adherence: '[x]'  Yes    '[]'  No Medication side effects: '[]'  Yes    '[x]'  No Adherence with salt restriction: '[]'  Yes    '[x]'  No, reports she is eating a large amount of sunflowers seeds recently as she feels this helps her manage anxiety Exercise: Yes '[x]'  walking 2 miles at least 3 days weekly Home Monitoring?: '[]'  Yes    '[x]'  No Smoking '[x]'  Yes, marijuana SOB? '[x]'  Yes, sometimes with climbing steps Chest Pain?: '[]'  Yes    '[x]'  No  Leg swelling?: '[]'  Yes    '[x]'  No, but does report bilateral ankle swelling which she noticed increased when she began eating sunflower seeds Headaches?: '[]'  Yes    '[x]'  No Dizziness? '[]'  Yes    '[x]'  No Comments: Last visit 03/12/2019 with Dr. Wynetta Emery. During that encounter Amlodipine and Hydrochlorothiazide continued.   3. HYPERLIPIDEMIA FOLLOW-UP: Hyperlipidemia status: fair compliance Satisfied with current treatment?  yes Side effects:  no Medication compliance: fair compliance, reports she ran out of Atorvastatin about 3 weeks ago. Reports she was taking 1 fish oil  supplement pill while taking the Atorvastatin. Past cholesterol meds: atorvastain (lipitor) Supplements: flax seed oil 4 gel capsules daily Aspirin:  no The 10-year ASCVD risk score Mikey Bussing DC Jr., et al., 2013) is: 18.8%   Values used to calculate the score:     Age: 74 years     Sex: Female     Is Non-Hispanic African American: Yes     Diabetic: Yes     Tobacco smoker: No     Systolic Blood Pressure: 092 mmHg     Is BP treated: Yes     HDL Cholesterol: 24 mg/dL     Total Cholesterol: 148 mg/dL Chest pain:  no   Last visit 03/12/2019 with Dr. Wynetta Emery. During that encounter continued on Lipitor.   Patient Active Problem List   Diagnosis Date Noted  . Periodic limb movement 03/10/2017  . RLS (restless legs syndrome) 01/26/2017  . Mild diastolic dysfunction 33/00/7622  . Prediabetes 11/26/2016  . Hyperlipidemia 11/26/2016  . Anxiety state, unspecified 12/22/2013  . Obesity, unspecified 12/22/2013  . Chest pain 12/17/2013  . Depression   . Hypertension   . Eczema   . Gestational hypertension   . GERD (gastroesophageal reflux disease)      Current Outpatient Medications on File Prior to Visit  Medication Sig Dispense Refill  . amLODipine (NORVASC) 5 MG tablet Take 1 tablet (5 mg total) by mouth daily. 30 tablet 0  . atorvastatin (LIPITOR) 10 MG tablet Take 1 tablet (  10 mg total) by mouth daily. 90 tablet 3  . Blood Glucose Monitoring Suppl (TRUE METRIX METER) w/Device KIT Use as directed 1 kit 0  . escitalopram (LEXAPRO) 10 MG tablet Take 1 tablet (10 mg total) by mouth daily. 30 tablet 6  . glucose blood (TRUE METRIX BLOOD GLUCOSE TEST) test strip Use as instructed 100 each 12  . hydrochlorothiazide (HYDRODIURIL) 25 MG tablet Take 1 tablet (25 mg total) by mouth daily. 30 tablet 0  . levonorgestrel (MIRENA) 20 MCG/24HR IUD 1 each by Intrauterine route once.    . TRUEPLUS LANCETS 28G MISC Use as directed 100 each 5   No current facility-administered medications on file prior  to visit.    Allergies  Allergen Reactions  . Lisinopril Cough  . Metformin And Related     Caused palpitations/heart pounding    Social History   Socioeconomic History  . Marital status: Divorced    Spouse name: Not on file  . Number of children: Not on file  . Years of education: Not on file  . Highest education level: Not on file  Occupational History  . Not on file  Tobacco Use  . Smoking status: Former Research scientist (life sciences)  . Smokeless tobacco: Never Used  Vaping Use  . Vaping Use: Never used  Substance and Sexual Activity  . Alcohol use: No  . Drug use: No  . Sexual activity: Yes    Birth control/protection: I.U.D.    Comment: mirena in place  Other Topics Concern  . Not on file  Social History Narrative  . Not on file   Social Determinants of Health   Financial Resource Strain:   . Difficulty of Paying Living Expenses:   Food Insecurity:   . Worried About Charity fundraiser in the Last Year:   . Arboriculturist in the Last Year:   Transportation Needs:   . Film/video editor (Medical):   Marland Kitchen Lack of Transportation (Non-Medical):   Physical Activity:   . Days of Exercise per Week:   . Minutes of Exercise per Session:   Stress:   . Feeling of Stress :   Social Connections:   . Frequency of Communication with Friends and Family:   . Frequency of Social Gatherings with Friends and Family:   . Attends Religious Services:   . Active Member of Clubs or Organizations:   . Attends Archivist Meetings:   Marland Kitchen Marital Status:   Intimate Partner Violence:   . Fear of Current or Ex-Partner:   . Emotionally Abused:   Marland Kitchen Physically Abused:   . Sexually Abused:     Family History  Problem Relation Age of Onset  . Hypertension Mother   . Stroke Mother   . Diabetes Maternal Grandmother   . Hypertension Maternal Grandmother   . Cancer Maternal Aunt        breast cancer   . Anesthesia problems Neg Hx     Past Surgical History:  Procedure Laterality Date  .  NO PAST SURGERIES      ROS: Review of Systems Negative except as stated above  PHYSICAL EXAM: Vitals with BMI 12/27/2019 03/12/2019 01/05/2019  Height '5\' 1"'  - -  Weight 296 lbs 10 oz 272 lbs 277 lbs 11 oz  BMI 45.40 - -  Systolic 981 - 191  Diastolic 83 - 92  Pulse 87 - 75  SpO2- 95%, room air   Wt Readings from Last 3 Encounters:  12/27/19 (!) 296 lb 9.6  oz (134.5 kg)  03/12/19 272 lb (123.4 kg)  01/05/19 277 lb 11.2 oz (126 kg)    Physical Exam General appearance - alert, well appearing, and in no distress, oriented to person, place, and time and overweight Mental status - alert, oriented to person, place, and time, normal mood, behavior, speech, dress, motor activity, and thought processes Neck - supple, no significant adenopathy Lymphatics - no palpable lymphadenopathy, no hepatosplenomegaly Chest - clear to auscultation, no wheezes, rales or rhonchi, symmetric air entry, no tachypnea, retractions or cyanosis Heart - normal rate, regular rhythm, normal S1, S2, no murmurs, rubs, clicks or gallops Neurological - alert, oriented, normal speech, no focal findings or movement disorder noted, neck supple without rigidity, motor and sensory grossly normal bilaterally, normal muscle tone, no tremors, strength 5/5, Romberg sign negative, normal gait and station Musculoskeletal - no joint tenderness, deformity or swelling, no muscular tenderness noted Extremities - peripheral pulses normal, no clubbing or cyanosis, bilateral ankle edema 1 +, monofilament sensory exam is normal in both feet, no edema, redness or tenderness in the calves or thighs Skin - normal coloration and turgor, no rashes, no suspicious skin lesions noted  Results for orders placed or performed in visit on 12/27/19  POCT glycosylated hemoglobin (Hb A1C)  Result Value Ref Range   Hemoglobin A1C     HbA1c POC (<> result, manual entry)     HbA1c, POC (prediabetic range) 6.4 5.7 - 6.4 %   HbA1c, POC (controlled diabetic  range)    Glucose (CBG)  Result Value Ref Range   POC Glucose 156 (A) 70 - 99 mg/dl    ASSESSMENT AND PLAN: 1. Controlled type 2 diabetes mellitus without complication, without long-term current use of insulin ([place]): -CBG elevated today in clinic, asymptomatic, reports she had a late cookout dinner. -Hemoglobin A1C elevated at 6.4%, goal < 7%. This is slightly increased from the previous hemoglobin A1C of 6.3% on 08/03/2018.  -Patient previously not on anti-diabetic medication. Will continue on the same plan. -Discussed the importance of healthy eating habits, low-carbohydrate diet, low-sugar diet, regular aerobic exercise (at least 150 minutes a week as tolerated) and medication compliance to achieve or maintain control of diabetes. -Counseled patient to check blood sugars at least once daily.  -To achieve an A1C goal of less than or equal to 7.0 percent, a fasting blood sugar of 80 to 130 mg/dL and a postprandial glucose (90 to 120 minutes after a meal) less than 180 mg/dL. In the event of sugars less than 60 mg/dl or greater than 400 mg/dl please notify the clinic ASAP. It is recommended that you undergo annual eye exams and annual foot exams. -CMP to check kidney function, liver function, and electrolyte balance.  -CBC to check blood count. -Microalbumin/creatinine urine ratio to check kidney function. -Reports she plans to schedule eye appointment with her preferred ophthalmologist soon. -Follow-up with primary physician in 3 months or sooner if needed. - Comprehensive metabolic panel - CBC - POCT glycosylated hemoglobin (Hb A1C) - Glucose (CBG) - Microalbumin / creatinine urine ratio - TRUEplus Lancets 28G MISC; Use as directed  Dispense: 100 each; Refill: 5 - glucose blood (TRUE METRIX BLOOD GLUCOSE TEST) test strip; Use as instructed  Dispense: 100 each; Refill: 12  2. Essential hypertension: -Blood pressure at goal during today's visit. Level of blood pressure control unknown as  patient does not check blood pressure at home.  -Continue Amlodipine and Hydrochlorothiazide as prescribed.  -Patient has some concern about new-onset mild bilateral  ankle edema. Counseled patient that this may be a combination of several factors including 30 pound weight gain in the last 9 months, non-adherence to DASH diet, eating large amounts of sunflower seeds in trying to control anxiety, and a possible side effect of Amlodipine. Patient states that bilateral ankle edema did not worsen until 1 month ago when she began to eat large amounts sunflower seeds and will try to decrease consumption.  -Counseled patient that should bilateral ankle edema persist over the course of the next 4 to 6 weeks to notify provider. Patient agreeable. -CMP to check kidney function, liver function, and electrolyte balance.  -CBC to check blood count. -Follow-up with primary physician in 3 months or sooner if needed. - Comprehensive metabolic panel - CBC - amLODipine (NORVASC) 5 MG tablet; Take 1 tablet (5 mg total) by mouth daily.  Dispense: 30 tablet; Refill: 2 - hydrochlorothiazide (HYDRODIURIL) 25 MG tablet; Take 1 tablet (25 mg total) by mouth daily.  Dispense: 30 tablet; Refill: 2  3. Hyperlipidemia associated with type 2 diabetes mellitus ([place]): -Practice low-fat heart healthy diet and at least 150 minutes of moderate intensity exercise weekly as tolerated.  -Continue Atorvastatin as prescribed. Refills available on file.  -Lipid panel to check cholesterol level.  -Follow-up with primary physician in 3 months or sooner if needed. - Lipid Panel  4. Class 3 severe obesity due to excess calories with serious comorbidity and body mass index (BMI) of 45.0 to 49.9 in adult [facility]): -See #1 above   5. Anxiety disorder, unspecified type:  -Denies thoughts of self-harm and suicidal ideation. Reports she is still taking Lexapro and has an appointment soon with her mental health provider.  Patient was given the  opportunity to ask questions.  Patient verbalized understanding of the plan and was able to repeat key elements of the plan. Patient was given clear instructions to go to Emergency Department or return to medical center if symptoms don't improve, worsen, or new problems develop.The patient verbalized understanding.   Camillia Herter, NP

## 2019-12-28 ENCOUNTER — Encounter: Payer: Self-pay | Admitting: Internal Medicine

## 2019-12-28 LAB — LIPID PANEL
Chol/HDL Ratio: 7 ratio — ABNORMAL HIGH (ref 0.0–4.4)
Cholesterol, Total: 210 mg/dL — ABNORMAL HIGH (ref 100–199)
HDL: 30 mg/dL — ABNORMAL LOW (ref >39)
LDL Chol Calc (NIH): 148 mg/dL — ABNORMAL HIGH (ref 0–99)
Triglycerides: 176 mg/dL — ABNORMAL HIGH (ref 0–149)
VLDL Cholesterol Cal: 32 mg/dL (ref 5–40)

## 2019-12-28 LAB — CBC
Hematocrit: 42.5 % (ref 34.0–46.6)
Hemoglobin: 13.4 g/dL (ref 11.1–15.9)
MCH: 25.3 pg — ABNORMAL LOW (ref 26.6–33.0)
MCHC: 31.5 g/dL (ref 31.5–35.7)
MCV: 80 fL (ref 79–97)
Platelets: 395 10*3/uL (ref 150–450)
RBC: 5.29 x10E6/uL — ABNORMAL HIGH (ref 3.77–5.28)
RDW: 14.2 % (ref 11.7–15.4)
WBC: 10.2 10*3/uL (ref 3.4–10.8)

## 2019-12-28 LAB — MICROALBUMIN / CREATININE URINE RATIO
Creatinine, Urine: 90.4 mg/dL
Microalb/Creat Ratio: 9 mg/g creat (ref 0–29)
Microalbumin, Urine: 8.3 ug/mL

## 2019-12-28 LAB — COMPREHENSIVE METABOLIC PANEL
ALT: 27 IU/L (ref 0–32)
AST: 17 IU/L (ref 0–40)
Albumin/Globulin Ratio: 1.4 (ref 1.2–2.2)
Albumin: 4.3 g/dL (ref 3.8–4.8)
Alkaline Phosphatase: 68 IU/L (ref 48–121)
BUN/Creatinine Ratio: 14 (ref 9–23)
BUN: 12 mg/dL (ref 6–24)
Bilirubin Total: 0.4 mg/dL (ref 0.0–1.2)
CO2: 23 mmol/L (ref 20–29)
Calcium: 9.4 mg/dL (ref 8.7–10.2)
Chloride: 99 mmol/L (ref 96–106)
Creatinine, Ser: 0.84 mg/dL (ref 0.57–1.00)
GFR calc Af Amer: 99 mL/min/{1.73_m2} (ref >59)
GFR calc non Af Amer: 86 mL/min/{1.73_m2} (ref >59)
Globulin, Total: 3.1 g/dL (ref 1.5–4.5)
Glucose: 105 mg/dL — ABNORMAL HIGH (ref 65–99)
Potassium: 4.3 mmol/L (ref 3.5–5.2)
Sodium: 138 mmol/L (ref 134–144)
Total Protein: 7.4 g/dL (ref 6.0–8.5)

## 2019-12-29 ENCOUNTER — Other Ambulatory Visit: Payer: Self-pay | Admitting: Internal Medicine

## 2019-12-29 DIAGNOSIS — E782 Mixed hyperlipidemia: Secondary | ICD-10-CM

## 2019-12-29 NOTE — Progress Notes (Signed)
Please call patient with update.   Kidney function normal.   Liver function normal.   No anemia.  CBG and hemoglobin A1C discussed in clinic.   Cholesterol higher than expected. High cholesterol may increase risk of heart attack and/or stroke. Consider eating more fruits, vegetables, and lean baked meats such as chicken or fish. Moderate intensity exercise at least 150 minutes as tolerated per week may help as well.   Continue Atorvastatin as prescribed for cholesterol management. This medication has been refilled and sent to the pharmacy on file.

## 2019-12-30 ENCOUNTER — Other Ambulatory Visit: Payer: Self-pay

## 2019-12-30 DIAGNOSIS — E119 Type 2 diabetes mellitus without complications: Secondary | ICD-10-CM

## 2019-12-30 MED ORDER — TRUE METRIX METER W/DEVICE KIT: PACK | 0 refills | Status: DC

## 2020-01-04 ENCOUNTER — Other Ambulatory Visit: Payer: Self-pay | Admitting: Family Medicine

## 2020-01-04 DIAGNOSIS — E782 Mixed hyperlipidemia: Secondary | ICD-10-CM

## 2020-01-04 MED ORDER — ATORVASTATIN CALCIUM 20 MG PO TABS: 20.0000 mg | ORAL_TABLET | Freq: Every day | ORAL | 0 refills | Status: DC

## 2020-01-12 DIAGNOSIS — F411 Generalized anxiety disorder: Secondary | ICD-10-CM | POA: Diagnosis not present

## 2020-01-12 DIAGNOSIS — F332 Major depressive disorder, recurrent severe without psychotic features: Secondary | ICD-10-CM | POA: Diagnosis not present

## 2020-03-28 ENCOUNTER — Ambulatory Visit: Payer: Medicaid Other | Attending: Internal Medicine | Admitting: Internal Medicine

## 2020-03-28 ENCOUNTER — Other Ambulatory Visit: Payer: Self-pay

## 2020-03-28 ENCOUNTER — Telehealth: Payer: Self-pay | Admitting: Internal Medicine

## 2020-03-28 DIAGNOSIS — K625 Hemorrhage of anus and rectum: Secondary | ICD-10-CM

## 2020-03-28 DIAGNOSIS — F419 Anxiety disorder, unspecified: Secondary | ICD-10-CM

## 2020-03-28 DIAGNOSIS — F32A Depression, unspecified: Secondary | ICD-10-CM

## 2020-03-28 DIAGNOSIS — E785 Hyperlipidemia, unspecified: Secondary | ICD-10-CM | POA: Diagnosis not present

## 2020-03-28 DIAGNOSIS — Z23 Encounter for immunization: Secondary | ICD-10-CM | POA: Diagnosis not present

## 2020-03-28 DIAGNOSIS — I10 Essential (primary) hypertension: Secondary | ICD-10-CM | POA: Diagnosis not present

## 2020-03-28 DIAGNOSIS — E1169 Type 2 diabetes mellitus with other specified complication: Secondary | ICD-10-CM | POA: Diagnosis not present

## 2020-03-28 MED ORDER — DOCUSATE SODIUM 100 MG PO CAPS: 100.0000 mg | ORAL_CAPSULE | Freq: Two times a day (BID) | ORAL | 1 refills | Status: DC

## 2020-03-28 MED ORDER — HYDROCORTISONE ACETATE 25 MG RE SUPP: 25.0000 mg | Freq: Two times a day (BID) | RECTAL | 0 refills | Status: DC | PRN

## 2020-03-28 NOTE — Telephone Encounter (Signed)
-----   Message from Particia Lather, [place] sent at 03/28/2020  4:15 PM EDT ----- Please contact Whitney White and schedule 4 month appt

## 2020-03-28 NOTE — Telephone Encounter (Signed)
Called patient to schedule 4 month follow up appointment at [phone]. Patient's phone was ringing busy and no voicemail was available.

## 2020-03-28 NOTE — Progress Notes (Signed)
Virtual Visit via Telephone Note Due to current restrictions/limitations of in-office visits due to the COVID-19 pandemic, this scheduled clinical appointment was converted to a telehealth visit  I connected with Whitney White on 03/28/20 at 11 a.m by telephone and verified that I am speaking with the correct person using two identifiers.  Location: Patient: home Provider: office at [facility]   I discussed the limitations, risks, security and privacy concerns of performing an evaluation and management service by telephone and the availability of in person appointments. I also discussed with the patient that there may be a patient responsible charge related to this service. The patient expressed understanding and agreed to proceed.   History of Present Illness: Pt with hx of HTN, anxiety/depression, GERD, DOE, DM II,HLand obesity.  Last seen 12/2019.  Today is for chronic ds management.  Pt c/o rectal bleeding which she thinks is from internal hemorrhoids.  Reports being dx with internal hemorrhoids when she was in the [place] in the early 2000s.  Never had c-scope. -for past wk she has had bleeding with BM especially if BM is hard.  She does not feel any hemorrhoids on the outside. Some burning of anus with BM.  No wgh loss. -tries to eat fruits and drink prune juice to keep stools soft but constipation not a major issue.    DM:  Diet control.  BS this a.m was 134.  Checks TID before meals; range 120-150 Doing better with eating habits; less problems with food insecurities so she can make better food choices. Still walks around her neighborhood but not as often as she would like due to work schedule.  Going 2-3 x/wk  HTN:  Compliant with meds HCTZ and Norvasc No device at home to check BP No LE edema No CP/SOB  HL:  Compliant with Lipitor.  Dose increased on last visit to 20 mg  Anx/dep:  Doing well on Lexapro and Trazodone.  Not taking Trazodone every day.  Last appt with psychiatrist  was 01/2020.   HM:  Due for eye exam.  [facility]  Completed COVID vaccine series Current Outpatient Medications  Medication Instructions  . amLODipine (NORVASC) 5 mg, Oral, Daily  . atorvastatin (LIPITOR) 20 mg, Oral, Daily  . Blood Glucose Monitoring Suppl (TRUE METRIX METER) w/Device KIT Use as directed  . escitalopram (LEXAPRO) 10 mg, Oral, Daily  . glucose blood (TRUE METRIX BLOOD GLUCOSE TEST) test strip Use as instructed  . hydrochlorothiazide (HYDRODIURIL) 25 mg, Oral, Daily  . levonorgestrel (MIRENA) 20 MCG/24HR IUD 1 each,  Once  . traZODone (DESYREL) 50 MG tablet TAKE 0.5 1 TABLET BY MOUTH AT BEDTIME FOR SLEEP  . TRUEplus Lancets 28G MISC Use as directed      Observations/Objective: Last BMI 56  Results for orders placed or performed in visit on 12/27/19  Comprehensive metabolic panel  Result Value Ref Range   Glucose 105 (H) 65 - 99 mg/dL   BUN 12 6 - 24 mg/dL   Creatinine, Ser 0.84 0.57 - 1.00 mg/dL   GFR calc non Af Amer 86 >59 mL/min/1.73   GFR calc Af Amer 99 >59 mL/min/1.73   BUN/Creatinine Ratio 14 9 - 23   Sodium 138 134 - 144 mmol/L   Potassium 4.3 3.5 - 5.2 mmol/L   Chloride 99 96 - 106 mmol/L   CO2 23 20 - 29 mmol/L   Calcium 9.4 8.7 - 10.2 mg/dL   Total Protein 7.4 6.0 - 8.5 g/dL  Albumin 4.3 3.8 - 4.8 g/dL   Globulin, Total 3.1 1.5 - 4.5 g/dL   Albumin/Globulin Ratio 1.4 1.2 - 2.2   Bilirubin Total 0.4 0.0 - 1.2 mg/dL   Alkaline Phosphatase 68 48 - 121 IU/L   AST 17 0 - 40 IU/L   ALT 27 0 - 32 IU/L  CBC  Result Value Ref Range   WBC 10.2 3.4 - 10.8 x10E3/uL   RBC 5.29 (H) 3.77 - 5.28 x10E6/uL   Hemoglobin 13.4 11.1 - 15.9 g/dL   Hematocrit 42.5 34.0 - 46.6 %   MCV 80 79 - 97 fL   MCH 25.3 (L) 26.6 - 33.0 pg   MCHC 31.5 31 - 35 g/dL   RDW 14.2 11.7 - 15.4 %   Platelets 395 150 - 450 x10E3/uL  Lipid Panel  Result Value Ref Range   Cholesterol, Total 210 (H) 100 - 199 mg/dL   Triglycerides 176 (H) 0 - 149 mg/dL   HDL 30  (L) >39 mg/dL   VLDL Cholesterol Cal 32 5 - 40 mg/dL   LDL Chol Calc (NIH) 148 (H) 0 - 99 mg/dL   Chol/HDL Ratio 7.0 (H) 0.0 - 4.4 ratio  Microalbumin / creatinine urine ratio  Result Value Ref Range   Creatinine, Urine 90.4 Not Estab. mg/dL   Microalbumin, Urine 8.3 Not Estab. ug/mL   Microalb/Creat Ratio 9 0 - 29 mg/g creat  POCT glycosylated hemoglobin (Hb A1C)  Result Value Ref Range   Hemoglobin A1C     HbA1c POC (<> result, manual entry)     HbA1c, POC (prediabetic range) 6.4 5.7 - 6.4 %   HbA1c, POC (controlled diabetic range)    Glucose (CBG)  Result Value Ref Range   POC Glucose 156 (A) 70 - 99 mg/dl   Depression screen [facility] 2/9 03/28/2020 12/27/2019 03/12/2019  Decreased Interest 0 1 0  Down, Depressed, Hopeless 0 0 0  PHQ - 2 Score 0 1 0  Altered sleeping - - -  Tired, decreased energy - - -  Change in appetite - - -  Feeling bad or failure about yourself  - - -  Trouble concentrating - - -  Moving slowly or fidgety/restless - - -  Suicidal thoughts - - -  PHQ-9 Score - - -  Difficult doing work/chores - - -  Some recent data might be hidden   Assessment and Plan: 1. Type 2 diabetes mellitus with morbid obesity (HCC) Blood sugars are good. Encouraged her to continue trying to make healthy food choices and smaller portion sizes. Continue trying to get in regular exercise for total of 150 minutes/week. - Ambulatory referral to Ophthalmology  2. Essential hypertension Continue current medications and low-salt diet.  3. Rectal bleeding Discussed the importance of keeping bowel movements soft and regular.  Foods and green leafy vegetables are a good source of fiber.  I have prescribed some Colace for her to use as needed.  Also prescribed some Anusol suppository to use as needed. - Ambulatory referral to Gastroenterology - hydrocortisone (ANUSOL-HC) 25 MG suppository; Place 1 suppository (25 mg total) rectally 2 (two) times daily as needed for hemorrhoids or anal  itching.  Dispense: 12 suppository; Refill: 0 - docusate sodium (COLACE) 100 MG capsule; Take 1 capsule (100 mg total) by mouth 2 (two) times daily.  Dispense: 30 capsule; Refill: 1  4. Hyperlipidemia associated with type 2 diabetes mellitus (HCC) Continue atorvastatin and healthy eating habits  5. Anxiety and depression Plugged into mental  health services and doing well.  6. Need for influenza vaccination Advised [facility] to [facility] after she received her flu shot so that I can update her health maintenance.   Follow Up Instructions: 4 mths   I discussed the assessment and treatment plan with the patient. The patient was provided an opportunity to ask questions and all were answered. The patient agreed with the plan and demonstrated an understanding of the instructions.   The patient was advised to call back or seek an in-person evaluation if the symptoms worsen or if the condition fails to improve as anticipated.  I provided 16 minutes of non-face-to-face time during this encounter.   Karle Plumber, MD

## 2020-04-11 DIAGNOSIS — F411 Generalized anxiety disorder: Secondary | ICD-10-CM | POA: Diagnosis not present

## 2020-04-11 DIAGNOSIS — F332 Major depressive disorder, recurrent severe without psychotic features: Secondary | ICD-10-CM | POA: Diagnosis not present

## 2020-04-15 ENCOUNTER — Other Ambulatory Visit: Payer: Self-pay | Admitting: Family Medicine

## 2020-04-15 DIAGNOSIS — E782 Mixed hyperlipidemia: Secondary | ICD-10-CM

## 2020-04-15 NOTE — Telephone Encounter (Signed)
Requested Prescriptions  Pending Prescriptions Disp Refills   atorvastatin (LIPITOR) 20 MG tablet [Pharmacy Med Name: ATORVASTATIN 20 MG TABLET] 90 tablet 1    Sig: TAKE 1 TABLET BY MOUTH EVERY DAY     Cardiovascular:  Antilipid - Statins Failed - 04/15/2020 11:45 AM      Failed - Total Cholesterol in normal range and within 360 days    Cholesterol, Total  Date Value Ref Range Status  12/27/2019 210 (H) 100 - 199 mg/dL Final         Failed - LDL in normal range and within 360 days    LDL Chol Calc (NIH)  Date Value Ref Range Status  12/27/2019 148 (H) 0 - 99 mg/dL Final         Failed - HDL in normal range and within 360 days    HDL  Date Value Ref Range Status  12/27/2019 30 (L) >39 mg/dL Final         Failed - Triglycerides in normal range and within 360 days    Triglycerides  Date Value Ref Range Status  12/27/2019 176 (H) 0 - 149 mg/dL Final         Passed - Patient is not pregnant      Passed - Valid encounter within last 12 months    Recent Outpatient Visits          2 weeks ago Type 2 diabetes mellitus with morbid obesity (HCC)   [facility] [place], Gavin Pound B, MD   3 months ago Controlled type 2 diabetes mellitus without complication, without long-term current use of insulin (HCC)   [facility] [place], Virginia J, NP   1 year ago Controlled type 2 diabetes mellitus without complication, without long-term current use of insulin (HCC)   [place] [facility] [place], Gavin Pound B, MD   1 year ago Controlled type 2 diabetes mellitus without complication, without long-term current use of insulin (HCC)   [place] [facility] Jonah Kory B, MD   1 year ago Controlled type 2 diabetes mellitus without complication, without long-term current use of insulin [facility])   [facility] Marcine Matar, MD

## 2020-04-20 ENCOUNTER — Other Ambulatory Visit: Payer: Self-pay | Admitting: Physician Assistant

## 2020-04-20 ENCOUNTER — Encounter: Payer: Self-pay | Admitting: Internal Medicine

## 2020-04-20 DIAGNOSIS — I10 Essential (primary) hypertension: Secondary | ICD-10-CM

## 2020-04-20 MED ORDER — AMLODIPINE BESYLATE 5 MG PO TABS: 5.0000 mg | ORAL_TABLET | Freq: Every day | ORAL | 2 refills | Status: DC

## 2020-05-16 ENCOUNTER — Other Ambulatory Visit: Payer: Self-pay | Admitting: Family

## 2020-05-16 DIAGNOSIS — I10 Essential (primary) hypertension: Secondary | ICD-10-CM

## 2020-05-18 ENCOUNTER — Telehealth (INDEPENDENT_AMBULATORY_CARE_PROVIDER_SITE_OTHER): Payer: Self-pay | Admitting: Internal Medicine

## 2020-05-18 NOTE — Telephone Encounter (Signed)
Called pt to sched follow up in February with Dr. Laural Benes. Lvm.

## 2020-07-05 DIAGNOSIS — F411 Generalized anxiety disorder: Secondary | ICD-10-CM | POA: Diagnosis not present

## 2020-07-05 DIAGNOSIS — F332 Major depressive disorder, recurrent severe without psychotic features: Secondary | ICD-10-CM | POA: Diagnosis not present

## 2020-07-20 ENCOUNTER — Other Ambulatory Visit: Payer: Self-pay | Admitting: Physician Assistant

## 2020-07-20 DIAGNOSIS — I10 Essential (primary) hypertension: Secondary | ICD-10-CM

## 2020-10-02 DIAGNOSIS — F332 Major depressive disorder, recurrent severe without psychotic features: Secondary | ICD-10-CM | POA: Diagnosis not present

## 2020-10-02 DIAGNOSIS — F411 Generalized anxiety disorder: Secondary | ICD-10-CM | POA: Diagnosis not present

## 2020-10-19 ENCOUNTER — Other Ambulatory Visit: Payer: Self-pay | Admitting: Physician Assistant

## 2020-10-19 ENCOUNTER — Other Ambulatory Visit: Payer: Self-pay | Admitting: Internal Medicine

## 2020-10-19 DIAGNOSIS — E782 Mixed hyperlipidemia: Secondary | ICD-10-CM

## 2020-10-19 DIAGNOSIS — I10 Essential (primary) hypertension: Secondary | ICD-10-CM

## 2020-10-19 NOTE — Telephone Encounter (Signed)
Patient called, left VM to return the call to the office to schedule an OV for follow up. Noted last OV to follow up in February. MyChart message sent. Courtesy refill given.

## 2020-10-19 NOTE — Telephone Encounter (Signed)
Patient called, left VM to return the call to the office to schedule an OV for follow up. Noted last OV to follow up in February. MyChart message sent. Courtesy refill given.  

## 2020-11-20 ENCOUNTER — Other Ambulatory Visit: Payer: Self-pay | Admitting: Physician Assistant

## 2020-11-20 ENCOUNTER — Encounter: Payer: Self-pay | Admitting: Family

## 2020-11-20 ENCOUNTER — Other Ambulatory Visit: Payer: Self-pay | Admitting: Internal Medicine

## 2020-11-20 DIAGNOSIS — I10 Essential (primary) hypertension: Secondary | ICD-10-CM

## 2020-11-21 ENCOUNTER — Other Ambulatory Visit: Payer: Self-pay | Admitting: Internal Medicine

## 2020-11-21 DIAGNOSIS — I10 Essential (primary) hypertension: Secondary | ICD-10-CM

## 2020-11-21 MED ORDER — AMLODIPINE BESYLATE 5 MG PO TABS: 1.0000 | ORAL_TABLET | Freq: Every day | ORAL | 0 refills | Status: DC

## 2020-11-21 MED ORDER — HYDROCHLOROTHIAZIDE 25 MG PO TABS: 1.0000 | ORAL_TABLET | Freq: Every day | ORAL | 0 refills | Status: DC

## 2020-11-21 NOTE — Progress Notes (Signed)
Virtual Visit via Telephone Note  I connected with Whitney White, on 11/22/2020 at 8:00 AM by telephone due to the COVID-19 pandemic and verified that I am speaking with the correct person using two identifiers.  Due to current restrictions/limitations of in-office visits due to the COVID-19 pandemic, this scheduled clinical appointment was converted to a telehealth visit.   Consent: I discussed the limitations, risks, security and privacy concerns of performing an evaluation and management service by telephone and the availability of in person appointments. I also discussed with the patient that there may be a patient responsible charge related to this service. The patient expressed understanding and agreed to proceed.   Location of Patient: Home  Location of Provider: [facility] [place] participating in Telemedicine visit: Lissa Hoard, NP Elmon Else, CMA   History of Present Illness: Whitney White is a 45 year-old female who presents for hypertension follow-up.  HYPERTENSION FOLLOW-UP: Visit 03/28/2020 at [facility] per MD note: Continue current medications and low-salt diet.  11/22/2020: Currently taking: see medication list Med Adherence: _0  Yes    _1  No Medication side effects: _2  Yes    _3  No Adherence with salt restriction (low-salt diet): _4  Yes  _5  No Exercise: Yes _6  No _7  Home Monitoring?: _8  Yes    _9  No Monitoring Frequency: _10  Yes    _11  No Home BP results range: _12  Yes    _13  No Smoking _14  Yes _15  No SOB? _16  Yes    _17  No Chest Pain?: _18  Yes    _19  No   Past Medical History:  Diagnosis Date   Depression    Diabetes mellitus without complication ([place])    "pre-diabetic"   Eczema    GERD (gastroesophageal reflux disease)    Gestational hypertension    Hypertension    Allergies  Allergen Reactions   Lisinopril Cough   Metformin And Related     Caused  palpitations/heart pounding    Current Outpatient Medications on File Prior to Visit  Medication Sig Dispense Refill   amLODipine (NORVASC) 5 MG tablet Take 1 tablet (5 mg total) by mouth daily. OFFICE VISIT NEEDED FOR ADDITIONAL REFILLS 30 tablet 0   amLODipine (NORVASC) 5 MG tablet TAKE 1 TABLET (5 MG TOTAL) BY MOUTH DAILY. OFFICE VISIT NEEDED FOR ADDITIONAL REFILLS 30 tablet 1   atorvastatin (LIPITOR) 20 MG tablet Take 1 tablet (20 mg total) by mouth daily. OFFICE VISIT NEEDED FOR ADDITIONAL REFILLS 90 tablet 0   Blood Glucose Monitoring Suppl (TRUE METRIX METER) w/Device KIT Use as directed 1 kit 0   docusate sodium (COLACE) 100 MG capsule Take 1 capsule (100 mg total) by mouth 2 (two) times daily. 30 capsule 1   escitalopram (LEXAPRO) 10 MG tablet Take 1 tablet (10 mg total) by mouth daily. 30 tablet 6   glucose blood (TRUE METRIX BLOOD GLUCOSE TEST) test strip Use as instructed 100 each 12   hydrochlorothiazide (HYDRODIURIL) 25 MG tablet Take 1 tablet (25 mg total) by mouth daily. OFFICE VISIT NEEDED FOR ADDITIONAL REFILLS 30 tablet 0   hydrochlorothiazide (HYDRODIURIL) 25 MG tablet TAKE 1 TABLET (25 MG TOTAL) BY MOUTH DAILY. OFFICE VISIT NEEDED FOR ADDITIONAL REFILLS 30 tablet 1   hydrocortisone (ANUSOL-HC) 25 MG suppository Place 1 suppository (25 mg total) rectally 2 (two) times daily as needed for hemorrhoids or anal itching. 12 suppository 0   levonorgestrel (MIRENA) 20 MCG/24HR IUD 1 each  by Intrauterine route once.     traZODone (DESYREL) 50 MG tablet TAKE 0.5 1 TABLET BY MOUTH AT BEDTIME FOR SLEEP     TRUEplus Lancets 28G MISC Use as directed 100 each 5   No current facility-administered medications on file prior to visit.    Observations/Objective: Alert and oriented x 3. Not in acute distress. Physical examination not completed as this is a telemedicine visit.  Assessment and Plan: 1. Essential hypertension: - Level of blood pressure control unknown as patient does not  monitor in the home setting.  - Continue Amlodipine and Hydrochlorothiazide as prescribed. Both medications were refilled for a courtesy 30 day supply on yesterday 11/21/2020. - Counseled on blood pressure goal of less than 130/80, low-sodium, DASH diet, medication compliance, 150 minutes of moderate intensity exercise per week as tolerated. Discussed medication compliance, adverse effects. - Counseled patient on the importance of an in-person visit. Encouraged patient to keep appointment scheduled 12/12/2020 with her primary care provider Karle Plumber, MD.    Follow Up Instructions: Keep appointment with primary provider scheduled 12/12/2020.   Patient was given clear instructions to go to Emergency Department or return to medical center if symptoms don't improve, worsen, or new problems develop.The patient verbalized understanding.  I discussed the assessment and treatment plan with the patient. The patient was provided an opportunity to ask questions and all were answered. The patient agreed with the plan and demonstrated an understanding of the instructions.   The patient was advised to call back or seek an in-person evaluation if the symptoms worsen or if the condition fails to improve as anticipated.   I provided 5 minutes total of non-face-to-face time during this encounter.   Camillia Herter, NP  [facility] [place], [place] 11/22/2020, 8:00 AM

## 2020-11-21 NOTE — Telephone Encounter (Signed)
Notes to clinic: Patient due for follow up appointment on 01/19/2021 Review for refill until this time   Requested Prescriptions  Pending Prescriptions Disp Refills   amLODipine (NORVASC) 5 MG tablet [Pharmacy Med Name: AMLODIPINE BESYLATE 5 MG TAB] 30 tablet 0    Sig: Take 1 tablet (5 mg total) by mouth daily. OFFICE VISIT NEEDED FOR ADDITIONAL REFILLS      Cardiovascular:  Calcium Channel Blockers Failed - 11/21/2020  2:13 PM      Failed - Valid encounter within last 6 months    Recent Outpatient Visits           7 months ago Type 2 diabetes mellitus with morbid obesity (HCC)   [place] [facility] Jonah Cavanah B, MD   11 months ago Controlled type 2 diabetes mellitus without complication, without long-term current use of insulin (HCC)   [facility] [facility] [place], Virginia J, NP   1 year ago Controlled type 2 diabetes mellitus without complication, without long-term current use of insulin (HCC)   [place] [facility] [place], Gavin Pound B, MD   2 years ago Controlled type 2 diabetes mellitus without complication, without long-term current use of insulin (HCC)   [place] [facility] [place], Gavin Pound B, MD   2 years ago Controlled type 2 diabetes mellitus without complication, without long-term current use of insulin [facility])   [facility] Marcine Matar, MD       Future Appointments             In 1 month Marcine Matar, MD [facility]             Passed - Last BP in normal range    BP Readings from Last 1 Encounters:  12/27/19 121/83            hydrochlorothiazide (HYDRODIURIL) 25 MG tablet [Pharmacy Med Name: HYDROCHLOROTHIAZIDE 25 MG TAB] 30 tablet 0    Sig: Take 1 tablet (25 mg total) by mouth daily. OFFICE VISIT NEEDED FOR ADDITIONAL REFILLS      Cardiovascular: Diuretics - Thiazide Failed - 11/21/2020  2:13 PM       Failed - Valid encounter within last 6 months    Recent Outpatient Visits           7 months ago Type 2 diabetes mellitus with morbid obesity (HCC)   [place] [facility] [place], Gavin Pound B, MD   11 months ago Controlled type 2 diabetes mellitus without complication, without long-term current use of insulin [facility])   [place] [facility] [place], Virginia J, NP   1 year ago Controlled type 2 diabetes mellitus without complication, without long-term current use of insulin (HCC)   [place] [facility] Jonah Wence B, MD   2 years ago Controlled type 2 diabetes mellitus without complication, without long-term current use of insulin (HCC)    [facility] Jonah Grupe B, MD   2 years ago Controlled type 2 diabetes mellitus without complication, without long-term current use of insulin [facility])    [facility] Marcine Matar, MD       Future Appointments             In 1 month Laural Benes, Binnie Rail, MD [facility]  Passed - Ca in normal range and within 360 days    Calcium  Date Value Ref Range Status  12/27/2019 9.4 8.7 - 10.2 mg/dL Final          Passed - Cr in normal range and within 360 days    Creatinine, Ser  Date Value Ref Range Status  12/27/2019 0.84 0.57 - 1.00 mg/dL Final          Passed - K in normal range and within 360 days    Potassium  Date Value Ref Range Status  12/27/2019 4.3 3.5 - 5.2 mmol/L Final          Passed - Na in normal range and within 360 days    Sodium  Date Value Ref Range Status  12/27/2019 138 134 - 144 mmol/L Final          Passed - Last BP in normal range    BP Readings from Last 1 Encounters:  12/27/19 121/83

## 2020-11-22 ENCOUNTER — Other Ambulatory Visit: Payer: Self-pay

## 2020-11-22 ENCOUNTER — Telehealth (INDEPENDENT_AMBULATORY_CARE_PROVIDER_SITE_OTHER): Payer: Medicaid Other | Admitting: Family

## 2020-11-22 DIAGNOSIS — I10 Essential (primary) hypertension: Secondary | ICD-10-CM | POA: Diagnosis not present

## 2020-11-22 DIAGNOSIS — E785 Hyperlipidemia, unspecified: Secondary | ICD-10-CM

## 2020-12-12 ENCOUNTER — Ambulatory Visit: Payer: Medicaid Other | Attending: Internal Medicine | Admitting: Internal Medicine

## 2020-12-12 ENCOUNTER — Other Ambulatory Visit: Payer: Self-pay

## 2020-12-12 DIAGNOSIS — Z79899 Other long term (current) drug therapy: Secondary | ICD-10-CM | POA: Insufficient documentation

## 2020-12-12 DIAGNOSIS — E1169 Type 2 diabetes mellitus with other specified complication: Secondary | ICD-10-CM | POA: Diagnosis not present

## 2020-12-12 DIAGNOSIS — Z793 Long term (current) use of hormonal contraceptives: Secondary | ICD-10-CM | POA: Insufficient documentation

## 2020-12-12 DIAGNOSIS — I10 Essential (primary) hypertension: Secondary | ICD-10-CM | POA: Insufficient documentation

## 2020-12-12 DIAGNOSIS — E785 Hyperlipidemia, unspecified: Secondary | ICD-10-CM | POA: Diagnosis not present

## 2020-12-12 DIAGNOSIS — R21 Rash and other nonspecific skin eruption: Secondary | ICD-10-CM | POA: Insufficient documentation

## 2020-12-12 DIAGNOSIS — Z8249 Family history of ischemic heart disease and other diseases of the circulatory system: Secondary | ICD-10-CM | POA: Diagnosis not present

## 2020-12-12 DIAGNOSIS — Z6841 Body Mass Index (BMI) 40.0 and over, adult: Secondary | ICD-10-CM | POA: Insufficient documentation

## 2020-12-12 DIAGNOSIS — F411 Generalized anxiety disorder: Secondary | ICD-10-CM | POA: Insufficient documentation

## 2020-12-12 DIAGNOSIS — Z87891 Personal history of nicotine dependence: Secondary | ICD-10-CM | POA: Diagnosis not present

## 2020-12-12 NOTE — Progress Notes (Signed)
Patient ID: Whitney White, female    DOB: [birth year]  MRN: [id number]  CC: chronic ds management  Subjective: Whitney White is a 45 y.o. female who presents for chronic ds management Her concerns today include:  Pt with hx of HTN, anxiety/depression, GERD, DOE, DM II, HL and obesity.  DM/Obesity: checks BS BID but not in past 1-2 wks.  Gives range 120-150.  Not on any med for DM Noted a dark spot on forehead x 1-2 mths.  States she was told by physician assistant that it may be related to her diabetes. Emotional eater.  She has seen a nutritionist before.  She states that she does well for her.  And then afterwards she falls back into her old habits.  She is wondering about doing a bowel cleanse prior to restarting healthy eating habits. Not much activity.  Works from home in front of computer all day.   GAD:  Every yr around her birthday, she has increase anxiety about dying.  Takes Lexapro daily, Hydroxyzine and   trazodone Has not seen counselor in a while but has a psychiatrist at [place].  Last seen 10/2020.  Seen Q 3 mth  HTN:  taking Norvasc and HCTZ Not limiting salt as much as she should No chest pains.  No lower extremity edema.  HL:  taking Lipitor and a Flex Seed Oil tab OTC Patient Active Problem List   Diagnosis Date Noted   Periodic limb movement 03/10/2017   RLS (restless legs syndrome) 02/72/5366   Mild diastolic dysfunction 44/08/4740   Prediabetes 11/26/2016   Hyperlipidemia 11/26/2016   Anxiety state, unspecified 12/22/2013   Obesity, unspecified 12/22/2013   Chest pain 12/17/2013   Depression    Hypertension    Eczema    Gestational hypertension    GERD (gastroesophageal reflux disease)      Current Outpatient Medications on File Prior to Visit  Medication Sig Dispense Refill   amLODipine (NORVASC) 5 MG tablet TAKE 1 TABLET (5 MG TOTAL) BY MOUTH DAILY. OFFICE VISIT NEEDED FOR ADDITIONAL REFILLS 30 tablet 1   atorvastatin (LIPITOR) 20 MG  tablet Take 1 tablet (20 mg total) by mouth daily. OFFICE VISIT NEEDED FOR ADDITIONAL REFILLS 90 tablet 0   Blood Glucose Monitoring Suppl (TRUE METRIX METER) w/Device KIT Use as directed 1 kit 0   docusate sodium (COLACE) 100 MG capsule Take 1 capsule (100 mg total) by mouth 2 (two) times daily. (Patient not taking: Reported on 12/12/2020) 30 capsule 1   escitalopram (LEXAPRO) 10 MG tablet Take 1 tablet (10 mg total) by mouth daily. (Patient not taking: Reported on 12/12/2020) 30 tablet 6   glucose blood (TRUE METRIX BLOOD GLUCOSE TEST) test strip Use as instructed 100 each 12   hydrochlorothiazide (HYDRODIURIL) 25 MG tablet TAKE 1 TABLET (25 MG TOTAL) BY MOUTH DAILY. OFFICE VISIT NEEDED FOR ADDITIONAL REFILLS 30 tablet 1   hydrocortisone (ANUSOL-HC) 25 MG suppository Place 1 suppository (25 mg total) rectally 2 (two) times daily as needed for hemorrhoids or anal itching. 12 suppository 0   levonorgestrel (MIRENA) 20 MCG/24HR IUD 1 each by Intrauterine route once.     traZODone (DESYREL) 50 MG tablet TAKE 0.5 1 TABLET BY MOUTH AT BEDTIME FOR SLEEP     TRUEplus Lancets 28G MISC Use as directed 100 each 5   No current facility-administered medications on file prior to visit.    Allergies  Allergen Reactions   Lisinopril Cough   Metformin And Related  Caused palpitations/heart pounding    Social History   Socioeconomic History   Marital status: Divorced    Spouse name: Not on file   Number of children: Not on file   Years of education: Not on file   Highest education level: Not on file  Occupational History   Not on file  Tobacco Use   Smoking status: Former    Pack years: 0.00   Smokeless tobacco: Never  Vaping Use   Vaping Use: Never used  Substance and Sexual Activity   Alcohol use: No   Drug use: No   Sexual activity: Yes    Birth control/protection: I.U.D.    Comment: mirena in place  Other Topics Concern   Not on file  Social History Narrative   Not on file    Social Determinants of Health   Financial Resource Strain: Not on file  Food Insecurity: Not on file  Transportation Needs: Not on file  Physical Activity: Not on file  Stress: Not on file  Social Connections: Not on file  Intimate Partner Violence: Not on file    Family History  Problem Relation Age of Onset   Hypertension Mother    Stroke Mother    Diabetes Maternal Grandmother    Hypertension Maternal Grandmother    Cancer Maternal Aunt        breast cancer    Anesthesia problems Neg Hx     Past Surgical History:  Procedure Laterality Date   NO PAST SURGERIES      ROS: Review of Systems Negative except as stated above  PHYSICAL EXAM: BP 108/72   Pulse 92   Resp 16   Ht '5\' 4"'  (1.626 m)   Wt 297 lb 12.8 oz (135.1 kg)   SpO2 96%   BMI 51.12 kg/m   Wt Readings from Last 3 Encounters:  12/12/20 297 lb 12.8 oz (135.1 kg)  12/27/19 (!) 296 lb 9.6 oz (134.5 kg)  03/12/19 272 lb (123.4 kg)    Physical Exam  General appearance - alert, well appearing, and in no distress Mental status - normal mood, behavior, speech, dress, motor activity, and thought processes Neck - supple, no significant adenopathy Chest - clear to auscultation, no wheezes, rales or rhonchi, symmetric air entry Heart - normal rate, regular rhythm, normal S1, S2, no murmurs, rubs, clicks or gallops Extremities - peripheral pulses normal, no pedal edema, no clubbing or cyanosis Skin -hyperpigmented velvety dermatitis on the center of the forehead    CMP Latest Ref Rng & Units 12/27/2019 11/12/2018 10/08/2017  Glucose 65 - 99 mg/dL 105(H) 104(H) 95  BUN 6 - 24 mg/dL '12 9 12  ' Creatinine 0.57 - 1.00 mg/dL 0.84 0.91 0.85  Sodium 134 - 144 mmol/L 138 138 142  Potassium 3.5 - 5.2 mmol/L 4.3 3.6 4.4  Chloride 96 - 106 mmol/L 99 95(L) 100  CO2 20 - 29 mmol/L '23 27 25  ' Calcium 8.7 - 10.2 mg/dL 9.4 9.7 9.2  Total Protein 6.0 - 8.5 g/dL 7.4 7.6 7.2  Total Bilirubin 0.0 - 1.2 mg/dL 0.4 0.4 <0.2   Alkaline Phos 48 - 121 IU/L 68 64 53  AST 0 - 40 IU/L '17 20 16  ' ALT 0 - 32 IU/L '27 15 15   ' Lipid Panel     Component Value Date/Time   CHOL 210 (H) 12/27/2019 1010   TRIG 176 (H) 12/27/2019 1010   HDL 30 (L) 12/27/2019 1010   CHOLHDL 7.0 (H) 12/27/2019 1010   CHOLHDL  4.5 01/06/2014 1241   VLDL 17 01/06/2014 1241   LDLCALC 148 (H) 12/27/2019 1010    CBC    Component Value Date/Time   WBC 10.2 12/27/2019 1010   WBC 8.1 08/24/2016 0042   RBC 5.29 (H) 12/27/2019 1010   RBC 5.06 08/24/2016 0042   HGB 13.4 12/27/2019 1010   HCT 42.5 12/27/2019 1010   PLT 395 12/27/2019 1010   MCV 80 12/27/2019 1010   MCH 25.3 (L) 12/27/2019 1010   MCH 24.1 (L) 08/24/2016 0042   MCHC 31.5 12/27/2019 1010   MCHC 31.4 08/24/2016 0042   RDW 14.2 12/27/2019 1010   LYMPHSABS 2.0 08/24/2016 0042   MONOABS 0.4 08/24/2016 0042   EOSABS 0.1 08/24/2016 0042   BASOSABS 0.0 08/24/2016 0042   Lab Results  Component Value Date   HGBA1C 6.7 12/12/2020    ASSESSMENT AND PLAN: 1. Type 2 diabetes mellitus with morbid obesity (HCC) A1c is at goal without being on medication. Spent some time discussing and encouraging healthy eating habits and the importance of being consistent in doing so.  Advised her to try to move more.  She can start exercising if only for 10 minutes a day and then gradually increase over time.  She is agreeable to referral to medical weight management to help with weight loss.  Advised against colon cleanse as it has no bearing on her weight. - CBC - Comprehensive metabolic panel - Lipid panel - Microalbumin / creatinine urine ratio - Ambulatory referral to Ophthalmology - Amb Ref to Medical Weight Management - POCT glycosylated hemoglobin (Hb A1C)  2. Essential hypertension At goal.  Continue HCTZ and amlodipine.  3. Hyperlipidemia associated with type 2 diabetes mellitus (HCC) Continue atorvastatin.  4. Generalized anxiety disorder Patient is plugged in with behavioral  health services.  She will continue follow-up with her psychiatrist.  5. Facial rash This looks like acanthosis nigricans - Ambulatory referral to Dermatology    Patient was given the opportunity to ask questions.  Patient verbalized understanding of the plan and was able to repeat key elements of the plan.   No orders of the defined types were placed in this encounter.    Requested Prescriptions    No prescriptions requested or ordered in this encounter    No follow-ups on file.  Karle Plumber, MD, FACP

## 2020-12-13 LAB — COMPREHENSIVE METABOLIC PANEL
ALT: 16 IU/L (ref 0–32)
AST: 18 IU/L (ref 0–40)
Albumin/Globulin Ratio: 1.6 (ref 1.2–2.2)
Albumin: 4.9 g/dL — ABNORMAL HIGH (ref 3.8–4.8)
Alkaline Phosphatase: 80 IU/L (ref 44–121)
BUN/Creatinine Ratio: 10 (ref 9–23)
BUN: 8 mg/dL (ref 6–24)
Bilirubin Total: 0.8 mg/dL (ref 0.0–1.2)
CO2: 27 mmol/L (ref 20–29)
Calcium: 10.2 mg/dL (ref 8.7–10.2)
Chloride: 95 mmol/L — ABNORMAL LOW (ref 96–106)
Creatinine, Ser: 0.79 mg/dL (ref 0.57–1.00)
Globulin, Total: 3.1 g/dL (ref 1.5–4.5)
Glucose: 88 mg/dL (ref 65–99)
Potassium: 4.1 mmol/L (ref 3.5–5.2)
Sodium: 140 mmol/L (ref 134–144)
Total Protein: 8 g/dL (ref 6.0–8.5)
eGFR: 96 mL/min/{1.73_m2} (ref >59)

## 2020-12-13 LAB — LIPID PANEL
Chol/HDL Ratio: 4.8 ratio — ABNORMAL HIGH (ref 0.0–4.4)
Cholesterol, Total: 135 mg/dL (ref 100–199)
HDL: 28 mg/dL — ABNORMAL LOW (ref >39)
LDL Chol Calc (NIH): 85 mg/dL (ref 0–99)
Triglycerides: 123 mg/dL (ref 0–149)
VLDL Cholesterol Cal: 22 mg/dL (ref 5–40)

## 2020-12-13 LAB — CBC
Hematocrit: 43 % (ref 34.0–46.6)
Hemoglobin: 13.3 g/dL (ref 11.1–15.9)
MCH: 24.1 pg — ABNORMAL LOW (ref 26.6–33.0)
MCHC: 30.9 g/dL — ABNORMAL LOW (ref 31.5–35.7)
MCV: 78 fL — ABNORMAL LOW (ref 79–97)
Platelets: 429 10*3/uL (ref 150–450)
RBC: 5.53 x10E6/uL — ABNORMAL HIGH (ref 3.77–5.28)
RDW: 13.9 % (ref 11.7–15.4)
WBC: 8.4 10*3/uL (ref 3.4–10.8)

## 2020-12-13 LAB — POCT GLYCOSYLATED HEMOGLOBIN (HGB A1C): HbA1c, POC (controlled diabetic range): 6.7 % (ref 0.0–7.0)

## 2020-12-13 LAB — MICROALBUMIN / CREATININE URINE RATIO
Creatinine, Urine: 124.6 mg/dL
Microalb/Creat Ratio: 24 mg/g creat (ref 0–29)
Microalbumin, Urine: 30 ug/mL

## 2020-12-13 LAB — GLUCOSE, POCT (MANUAL RESULT ENTRY): POC Glucose: 103 mg/dl — AB (ref 70–99)

## 2021-01-19 ENCOUNTER — Ambulatory Visit: Payer: Medicaid Other | Admitting: Internal Medicine

## 2021-01-21 ENCOUNTER — Other Ambulatory Visit: Payer: Self-pay | Admitting: Internal Medicine

## 2021-01-21 DIAGNOSIS — E782 Mixed hyperlipidemia: Secondary | ICD-10-CM

## 2021-02-22 ENCOUNTER — Other Ambulatory Visit: Payer: Self-pay | Admitting: Internal Medicine

## 2021-02-22 DIAGNOSIS — I10 Essential (primary) hypertension: Secondary | ICD-10-CM

## 2021-04-28 ENCOUNTER — Other Ambulatory Visit: Payer: Self-pay | Admitting: Internal Medicine

## 2021-04-28 DIAGNOSIS — E782 Mixed hyperlipidemia: Secondary | ICD-10-CM

## 2021-04-29 NOTE — Telephone Encounter (Signed)
Requested Prescriptions  Pending Prescriptions Disp Refills  . atorvastatin (LIPITOR) 20 MG tablet [Pharmacy Med Name: ATORVASTATIN 20 MG TABLET] 90 tablet 0    Sig: TAKE 1 TABLET (20 MG TOTAL) BY MOUTH DAILY. OFFICE VISIT NEEDED FOR ADDITIONAL REFILLS     Cardiovascular:  Antilipid - Statins Failed - 04/28/2021  8:52 AM      Failed - HDL in normal range and within 360 days    HDL  Date Value Ref Range Status  12/12/2020 28 (L) >39 mg/dL Final         Passed - Total Cholesterol in normal range and within 360 days    Cholesterol, Total  Date Value Ref Range Status  12/12/2020 135 100 - 199 mg/dL Final         Passed - LDL in normal range and within 360 days    LDL Chol Calc (NIH)  Date Value Ref Range Status  12/12/2020 85 0 - 99 mg/dL Final         Passed - Triglycerides in normal range and within 360 days    Triglycerides  Date Value Ref Range Status  12/12/2020 123 0 - 149 mg/dL Final         Passed - Patient is not pregnant      Passed - Valid encounter within last 12 months    Recent Outpatient Visits          4 months ago Type 2 diabetes mellitus with morbid obesity (HCC)   [facility] Jonah Wilemon B, MD   1 year ago Type 2 diabetes mellitus with morbid obesity (HCC)   [facility] [place], Gavin Pound B, MD   1 year ago Controlled type 2 diabetes mellitus without complication, without long-term current use of insulin (HCC)   [place] [facility] [place], [place] J, NP   2 years ago Controlled type 2 diabetes mellitus without complication, without long-term current use of insulin (HCC)   [facility] [facility] [place], Gavin Pound B, MD   2 years ago Controlled type 2 diabetes mellitus without complication, without long-term current use of insulin [facility])   [place] [facility] Marcine Matar, MD

## 2021-06-12 DIAGNOSIS — F332 Major depressive disorder, recurrent severe without psychotic features: Secondary | ICD-10-CM | POA: Diagnosis not present

## 2021-06-12 DIAGNOSIS — F411 Generalized anxiety disorder: Secondary | ICD-10-CM | POA: Diagnosis not present

## 2021-06-29 ENCOUNTER — Other Ambulatory Visit: Payer: Self-pay | Admitting: Internal Medicine

## 2021-06-29 ENCOUNTER — Telehealth: Payer: Self-pay | Admitting: *Deleted

## 2021-06-29 DIAGNOSIS — I10 Essential (primary) hypertension: Secondary | ICD-10-CM

## 2021-06-29 NOTE — Telephone Encounter (Signed)
I called and made her an appt with Dr. Wynetta Emery for 08/24/2021 at 3:30 for her 6 month check and med refills

## 2021-06-29 NOTE — Telephone Encounter (Signed)
Requested medication (s) are due for refill today:   Yes  Requested medication (s) are on the active medication list:   Yes  Future visit scheduled:   Yes I called and got her scheduled for 08/24/2021 at 3:30    Last ordered: Returned for provider to review for refills prior to next appt.   Requested Prescriptions  Pending Prescriptions Disp Refills   amLODipine (NORVASC) 5 MG tablet [Pharmacy Med Name: AMLODIPINE BESYLATE 5 MG TAB] 30 tablet 3    Sig: TAKE 1 TABLET (5 MG TOTAL) BY MOUTH DAILY. OFFICE VISIT NEEDED FOR ADDITIONAL REFILLS     Cardiovascular:  Calcium Channel Blockers Failed - 06/29/2021 12:50 PM      Failed - Valid encounter within last 6 months    Recent Outpatient Visits           6 months ago Type 2 diabetes mellitus with morbid obesity (HCC)   [place] Jonah Fielder B, MD   1 year ago Type 2 diabetes mellitus with morbid obesity [facility])   [place] [facility] Jonah Eischeid B, MD   1 year ago Controlled type 2 diabetes mellitus without complication, without long-term current use of insulin [facility])   [place] [facility] [place], Virginia J, NP   2 years ago Controlled type 2 diabetes mellitus without complication, without long-term current use of insulin (HCC)   [place] [facility] [place], Gavin Pound B, MD   2 years ago Controlled type 2 diabetes mellitus without complication, without long-term current use of insulin [facility])   [place] [facility] Marcine Matar, MD       Future Appointments             In 1 month Marcine Matar, MD [facility]            Passed - Last BP in normal range    BP Readings from Last 1 Encounters:  12/12/20 108/72           hydrochlorothiazide (HYDRODIURIL) 25 MG tablet [Pharmacy Med Name: HYDROCHLOROTHIAZIDE 25 MG TAB] 30 tablet 3    Sig: TAKE 1 TABLET (25 MG TOTAL) BY MOUTH  DAILY. OFFICE VISIT NEEDED FOR ADDITIONAL REFILLS     Cardiovascular: Diuretics - Thiazide Failed - 06/29/2021 12:50 PM      Failed - Valid encounter within last 6 months    Recent Outpatient Visits           6 months ago Type 2 diabetes mellitus with morbid obesity (HCC)   [place] [facility] Jonah Tetrault B, MD   1 year ago Type 2 diabetes mellitus with morbid obesity [facility])   [place] [facility] Jonah Froning B, MD   1 year ago Controlled type 2 diabetes mellitus without complication, without long-term current use of insulin [facility])   [place] [facility] [place], Virginia J, NP   2 years ago Controlled type 2 diabetes mellitus without complication, without long-term current use of insulin [facility])   [place] [facility] Jonah Fidel B, MD   2 years ago Controlled type 2 diabetes mellitus without complication, without long-term current use of insulin [facility])   [place] [facility] Marcine Matar, MD       Future Appointments             In 1  month Marcine Matar, MD [facility]            Passed - Ca in normal range and within 360 days    Calcium  Date Value Ref Range Status  12/12/2020 10.2 8.7 - 10.2 mg/dL Final          Passed - Cr in normal range and within 360 days    Creatinine, Ser  Date Value Ref Range Status  12/12/2020 0.79 0.57 - 1.00 mg/dL Final          Passed - K in normal range and within 360 days    Potassium  Date Value Ref Range Status  12/12/2020 4.1 3.5 - 5.2 mmol/L Final          Passed - Na in normal range and within 360 days    Sodium  Date Value Ref Range Status  12/12/2020 140 134 - 144 mmol/L Final          Passed - Last BP in normal range    BP Readings from Last 1 Encounters:  12/12/20 108/72

## 2021-07-28 ENCOUNTER — Other Ambulatory Visit: Payer: Self-pay | Admitting: Internal Medicine

## 2021-07-28 DIAGNOSIS — I10 Essential (primary) hypertension: Secondary | ICD-10-CM

## 2021-07-28 DIAGNOSIS — E782 Mixed hyperlipidemia: Secondary | ICD-10-CM

## 2021-07-30 NOTE — Telephone Encounter (Signed)
Requested medication (s) are due for refill today: yes  Requested medication (s) are on the active medication list: yes  Last refill:  06/29/21 #30 0 refills  Future visit scheduled: yes in 3 weeks  Notes to clinic:  last labs 12/12/20 no valid encounter . Do you want courtesy refill ?     Requested Prescriptions  Pending Prescriptions Disp Refills   hydrochlorothiazide (HYDRODIURIL) 25 MG tablet [Pharmacy Med Name: HYDROCHLOROTHIAZIDE 25 MG TAB] 30 tablet 0    Sig: TAKE 1 TABLET (25 MG TOTAL) BY MOUTH DAILY. OFFICE VISIT NEEDED FOR ADDITIONAL REFILLS     Cardiovascular: Diuretics - Thiazide Failed - 07/28/2021 11:17 AM      Failed - Cr in normal range and within 180 days    Creatinine, Ser  Date Value Ref Range Status  12/12/2020 0.79 0.57 - 1.00 mg/dL Final          Failed - K in normal range and within 180 days    Potassium  Date Value Ref Range Status  12/12/2020 4.1 3.5 - 5.2 mmol/L Final          Failed - Na in normal range and within 180 days    Sodium  Date Value Ref Range Status  12/12/2020 140 134 - 144 mmol/L Final          Failed - Valid encounter within last 6 months    Recent Outpatient Visits           7 months ago Type 2 diabetes mellitus with morbid obesity (HCC)   [place] [facility] Jonah Obrecht B, MD   1 year ago Type 2 diabetes mellitus with morbid obesity (HCC)   [place] [facility] Jonah Obeid B, MD   1 year ago Controlled type 2 diabetes mellitus without complication, without long-term current use of insulin (HCC)   [place] [facility] [place], Virginia J, NP   2 years ago Controlled type 2 diabetes mellitus without complication, without long-term current use of insulin (HCC)   [place] [facility] [place], Gavin Pound B, MD   2 years ago Controlled type 2 diabetes mellitus without complication, without long-term current use of insulin [facility])   [place]  [facility] Marcine Matar, MD       Future Appointments             In 3 weeks Marcine Matar, MD [facility]            Passed - Last BP in normal range    BP Readings from Last 1 Encounters:  12/12/20 108/72          Signed Prescriptions Disp Refills   atorvastatin (LIPITOR) 20 MG tablet 90 tablet 0    Sig: TAKE 1 TABLET (20 MG TOTAL) BY MOUTH DAILY. OFFICE VISIT NEEDED FOR ADDITIONAL REFILLS     Cardiovascular:  Antilipid - Statins Failed - 07/28/2021 11:17 AM      Failed - Lipid Panel in normal range within the last 12 months    Cholesterol, Total  Date Value Ref Range Status  12/12/2020 135 100 - 199 mg/dL Final   LDL Chol Calc (NIH)  Date Value Ref Range Status  12/12/2020 85 0 - 99 mg/dL Final   HDL  Date Value Ref Range Status  12/12/2020 28 (L) >39 mg/dL Final   Triglycerides  Date Value Ref Range Status  12/12/2020 123  0 - 149 mg/dL Final         Passed - Patient is not pregnant      Passed - Valid encounter within last 12 months    Recent Outpatient Visits           7 months ago Type 2 diabetes mellitus with morbid obesity (HCC)   [place] [facility] Jonah Buczynski B, MD   1 year ago Type 2 diabetes mellitus with morbid obesity [facility])   [facility] Jonah Strauss B, MD   1 year ago Controlled type 2 diabetes mellitus without complication, without long-term current use of insulin [facility])   [place] [facility] [place], Virginia J, NP   2 years ago Controlled type 2 diabetes mellitus without complication, without long-term current use of insulin (HCC)   [place] [facility] Jonah Bradby B, MD   2 years ago Controlled type 2 diabetes mellitus without complication, without long-term current use of insulin [facility])   [place] [facility] Marcine Matar, MD       Future  Appointments             In 3 weeks Marcine Matar, MD [place] [facility]             amLODipine (NORVASC) 5 MG tablet 30 tablet 0    Sig: TAKE 1 TABLET (5 MG TOTAL) BY MOUTH DAILY. OFFICE VISIT NEEDED FOR ADDITIONAL REFILLS     Cardiovascular: Calcium Channel Blockers 2 Failed - 07/28/2021 11:17 AM      Failed - Valid encounter within last 6 months    Recent Outpatient Visits           7 months ago Type 2 diabetes mellitus with morbid obesity (HCC)   [place] [facility] Jonah Nazir B, MD   1 year ago Type 2 diabetes mellitus with morbid obesity [facility])   [place] [facility] Jonah Melamed B, MD   1 year ago Controlled type 2 diabetes mellitus without complication, without long-term current use of insulin [facility])   [place] [facility] [place], Virginia J, NP   2 years ago Controlled type 2 diabetes mellitus without complication, without long-term current use of insulin (HCC)   [place] [facility] Jonah Homewood B, MD   2 years ago Controlled type 2 diabetes mellitus without complication, without long-term current use of insulin [facility])    [place] [facility] Marcine Matar, MD       Future Appointments             In 3 weeks Marcine Matar, MD [facility]            Passed - Last BP in normal range    BP Readings from Last 1 Encounters:  12/12/20 108/72          Passed - Last Heart Rate in normal range    Pulse Readings from Last 1 Encounters:  12/12/20 92

## 2021-07-30 NOTE — Telephone Encounter (Signed)
Courtesy refill. Future visit in 3 weeks .  Requested Prescriptions  Pending Prescriptions Disp Refills   atorvastatin (LIPITOR) 20 MG tablet [Pharmacy Med Name: ATORVASTATIN 20 MG TABLET] 90 tablet 0    Sig: TAKE 1 TABLET (20 MG TOTAL) BY MOUTH DAILY. OFFICE VISIT NEEDED FOR ADDITIONAL REFILLS     Cardiovascular:  Antilipid - Statins Failed - 07/28/2021 11:17 AM      Failed - Lipid Panel in normal range within the last 12 months    Cholesterol, Total  Date Value Ref Range Status  12/12/2020 135 100 - 199 mg/dL Final   LDL Chol Calc (NIH)  Date Value Ref Range Status  12/12/2020 85 0 - 99 mg/dL Final   HDL  Date Value Ref Range Status  12/12/2020 28 (L) >39 mg/dL Final   Triglycerides  Date Value Ref Range Status  12/12/2020 123 0 - 149 mg/dL Final         Passed - Patient is not pregnant      Passed - Valid encounter within last 12 months    Recent Outpatient Visits          7 months ago Type 2 diabetes mellitus with morbid obesity (HCC)   [place] [facility] Jonah Riccobono B, MD   1 year ago Type 2 diabetes mellitus with morbid obesity (HCC)   [place] [facility] [place], Gavin Pound B, MD   1 year ago Controlled type 2 diabetes mellitus without complication, without long-term current use of insulin (HCC)   [place] [facility] [place], Virginia J, NP   2 years ago Controlled type 2 diabetes mellitus without complication, without long-term current use of insulin (HCC)   [place] [facility] [place], Gavin Pound B, MD   2 years ago Controlled type 2 diabetes mellitus without complication, without long-term current use of insulin (HCC)   [place] [facility] Marcine Matar, MD      Future Appointments            In 3 weeks Marcine Matar, MD [place] [facility]            amLODipine (NORVASC) 5 MG tablet [Pharmacy Med Name: AMLODIPINE  BESYLATE 5 MG TAB] 30 tablet 0    Sig: TAKE 1 TABLET (5 MG TOTAL) BY MOUTH DAILY. OFFICE VISIT NEEDED FOR ADDITIONAL REFILLS     Cardiovascular: Calcium Channel Blockers 2 Failed - 07/28/2021 11:17 AM      Failed - Valid encounter within last 6 months    Recent Outpatient Visits          7 months ago Type 2 diabetes mellitus with morbid obesity (HCC)   [place] [facility] Jonah Finks B, MD   1 year ago Type 2 diabetes mellitus with morbid obesity [facility])   [place] [facility] Jonah Stanislaw B, MD   1 year ago Controlled type 2 diabetes mellitus without complication, without long-term current use of insulin [facility])   [place] [facility] [place], Virginia J, NP   2 years ago Controlled type 2 diabetes mellitus without complication, without long-term current use of insulin [facility])   [place] [facility] Jonah Gleaves B, MD   2 years ago Controlled type 2 diabetes mellitus without complication, without long-term current use of insulin [facility])    [place] [facility] Marcine Matar, MD  Future Appointments            In 3 weeks Marcine Matar, MD [facility]           Passed - Last BP in normal range    BP Readings from Last 1 Encounters:  12/12/20 108/72         Passed - Last Heart Rate in normal range    Pulse Readings from Last 1 Encounters:  12/12/20 92          hydrochlorothiazide (HYDRODIURIL) 25 MG tablet [Pharmacy Med Name: HYDROCHLOROTHIAZIDE 25 MG TAB] 30 tablet 0    Sig: TAKE 1 TABLET (25 MG TOTAL) BY MOUTH DAILY. OFFICE VISIT NEEDED FOR ADDITIONAL REFILLS     Cardiovascular: Diuretics - Thiazide Failed - 07/28/2021 11:17 AM      Failed - Cr in normal range and within 180 days    Creatinine, Ser  Date Value Ref Range Status  12/12/2020 0.79 0.57 - 1.00 mg/dL Final         Failed - K in normal range and within 180 days     Potassium  Date Value Ref Range Status  12/12/2020 4.1 3.5 - 5.2 mmol/L Final         Failed - Na in normal range and within 180 days    Sodium  Date Value Ref Range Status  12/12/2020 140 134 - 144 mmol/L Final         Failed - Valid encounter within last 6 months    Recent Outpatient Visits          7 months ago Type 2 diabetes mellitus with morbid obesity (HCC)   [place] [facility] Jonah Stellmach B, MD   1 year ago Type 2 diabetes mellitus with morbid obesity (HCC)   [place] [facility] Jonah Oettinger B, MD   1 year ago Controlled type 2 diabetes mellitus without complication, without long-term current use of insulin (HCC)   [place] [facility] [place], Virginia J, NP   2 years ago Controlled type 2 diabetes mellitus without complication, without long-term current use of insulin (HCC)   [place] [facility] [place], Gavin Pound B, MD   2 years ago Controlled type 2 diabetes mellitus without complication, without long-term current use of insulin [facility])   [place] [facility] Marcine Matar, MD      Future Appointments            In 3 weeks Marcine Matar, MD [facility]           Passed - Last BP in normal range    BP Readings from Last 1 Encounters:  12/12/20 108/72

## 2021-08-24 ENCOUNTER — Ambulatory Visit: Payer: Medicaid Other | Attending: Internal Medicine | Admitting: Internal Medicine

## 2021-08-24 ENCOUNTER — Encounter: Payer: Self-pay | Admitting: Internal Medicine

## 2021-08-24 ENCOUNTER — Other Ambulatory Visit: Payer: Self-pay

## 2021-08-24 DIAGNOSIS — E1169 Type 2 diabetes mellitus with other specified complication: Secondary | ICD-10-CM | POA: Diagnosis not present

## 2021-08-24 DIAGNOSIS — Z6841 Body Mass Index (BMI) 40.0 and over, adult: Secondary | ICD-10-CM | POA: Diagnosis not present

## 2021-08-24 DIAGNOSIS — E785 Hyperlipidemia, unspecified: Secondary | ICD-10-CM | POA: Diagnosis not present

## 2021-08-24 DIAGNOSIS — F411 Generalized anxiety disorder: Secondary | ICD-10-CM | POA: Diagnosis not present

## 2021-08-24 DIAGNOSIS — I10 Essential (primary) hypertension: Secondary | ICD-10-CM | POA: Diagnosis not present

## 2021-08-24 LAB — POCT GLYCOSYLATED HEMOGLOBIN (HGB A1C): HbA1c, POC (controlled diabetic range): 7.1 % — AB (ref 0.0–7.0)

## 2021-08-24 LAB — GLUCOSE, POCT (MANUAL RESULT ENTRY): POC Glucose: 109 mg/dl — AB (ref 70–99)

## 2021-08-24 MED ORDER — EMPAGLIFLOZIN 10 MG PO TABS: 10.0000 mg | ORAL_TABLET | Freq: Every day | ORAL | 3 refills | Status: DC

## 2021-08-24 NOTE — Patient Instructions (Signed)

## 2021-08-24 NOTE — Progress Notes (Signed)
? ? ?Patient ID: Whitney White, female    DOB: 12-09-1975  MRN: [id number] ? ?CC: Diabetes and Hypertension ? ? ?Subjective: ?Whitney White is a 46 y.o. female who presents for chronic ds management.  Son is with her ?Her concerns today include:  ?Pt with hx of HTN, anxiety/depression, GERD, DOE, DM II, HL and obesity. ? ?DIABETES TYPE 2 ?Last A1C:   ?Results for orders placed or performed in visit on 08/24/21  ?POCT glycosylated hemoglobin (Hb A1C)  ?Result Value Ref Range  ? Hemoglobin A1C    ? HbA1c POC (<> result, manual entry)    ? HbA1c, POC (prediabetic range)    ? HbA1c, POC (controlled diabetic range) 7.1 (A) 0.0 - 7.0 %  ?POCT glucose (manual entry)  ?Result Value Ref Range  ? POC Glucose 109 (A) 70 - 99 mg/dl  ?Reports poor diet since 02/2021.  Started a new job at that time and has been too busy for meal planning.  Eating out lunch and dinner most of the times.  In the past she was cooking a lot more.  Not as active for the past several mths. Wgh up 7 lbs since 12/2020. Plans to do better. ?Reports [facility] within the last yr.  She will go by there and get a copy of the report to bring to me. ? ?HTN:  taking Norvasc and HCTZ daily as prescribed.   ?Eats sunflower seeds on a regular basis. ?+SOB with minimal activity which she associates with being overweight. ? ?HL:  taking Lipitor.  Taking Flex Seed supplement ? ?Anxiety:  [facility] and [facility].  On Lexapro 20 mg and Hydroxyzine 10 mg.  Doing well on these medications. ? ?HM: [facility] flu [facility]/2021.  Due for pap.  Plans to schedule with her GYN because she is due to replace [place] ?  ? ?Patient Active Problem List  ? Diagnosis Date Noted  ? Periodic limb movement 03/10/2017  ? RLS (restless legs syndrome) 01/26/2017  ? Mild diastolic dysfunction 63/89/3734  ? Prediabetes 11/26/2016  ? Hyperlipidemia 11/26/2016  ? Anxiety state, unspecified 12/22/2013  ? Obesity, unspecified  12/22/2013  ? Chest pain 12/17/2013  ? Depression   ? Hypertension   ? Eczema   ? Gestational hypertension   ? GERD (gastroesophageal reflux disease)   ?  ? ?Current Outpatient Medications on File Prior to Visit  ?Medication Sig Dispense Refill  ? amLODipine (NORVASC) 5 MG tablet TAKE 1 TABLET (5 MG TOTAL) BY MOUTH DAILY. OFFICE VISIT NEEDED FOR ADDITIONAL REFILLS 30 tablet 0  ? atorvastatin (LIPITOR) 20 MG tablet TAKE 1 TABLET (20 MG TOTAL) BY MOUTH DAILY. OFFICE VISIT NEEDED FOR ADDITIONAL REFILLS 90 tablet 0  ? Blood Glucose Monitoring Suppl (TRUE METRIX METER) w/Device KIT Use as directed 1 kit 0  ? escitalopram (LEXAPRO) 10 MG tablet Take 1 tablet (10 mg total) by mouth daily. (Patient not taking: Reported on 12/12/2020) 30 tablet 6  ? glucose blood (TRUE METRIX BLOOD GLUCOSE TEST) test strip Use as instructed 100 each 12  ? hydrochlorothiazide (HYDRODIURIL) 25 MG tablet TAKE 1 TABLET (25 MG TOTAL) BY MOUTH DAILY. OFFICE VISIT NEEDED FOR ADDITIONAL REFILLS 30 tablet 0  ? hydrocortisone (ANUSOL-HC) 25 MG suppository Place 1 suppository (25 mg total) rectally 2 (two) times daily as needed for hemorrhoids or anal itching. 12 suppository 0  ? hydrOXYzine (ATARAX/VISTARIL) 25 MG tablet Take 12.5 mg by mouth  2 (two) times daily as needed.    ? levonorgestrel (MIRENA) 20 MCG/24HR IUD 1 each by Intrauterine route once.    ? traZODone (DESYREL) 100 MG tablet Take 50-100 mg by mouth at bedtime.    ? traZODone (DESYREL) 50 MG tablet TAKE 0.5 1 TABLET BY MOUTH AT BEDTIME FOR SLEEP    ? TRUEplus Lancets 28G MISC Use as directed 100 each 5  ? ?No current facility-administered medications on file prior to visit.  ? ? ?Allergies  ?Allergen Reactions  ? Lisinopril Cough  ? Metformin And Related   ?  Caused palpitations/heart pounding  ? ? ?Social History  ? ?Socioeconomic History  ? Marital status: Divorced  ?  Spouse name: Not on file  ? Number of children: Not on file  ? Years of education: Not on file  ? Highest education  level: Not on file  ?Occupational History  ? Not on file  ?Tobacco Use  ? Smoking status: Former  ? Smokeless tobacco: Never  ?Vaping Use  ? Vaping Use: Never used  ?Substance and Sexual Activity  ? Alcohol use: No  ? Drug use: No  ? Sexual activity: Yes  ?  Birth control/protection: I.U.D.  ?  Comment: mirena in place  ?Other Topics Concern  ? Not on file  ?Social History Narrative  ? Not on file  ? ?Social Determinants of Health  ? ?Financial Resource Strain: Not on file  ?Food Insecurity: Not on file  ?Transportation Needs: Not on file  ?Physical Activity: Not on file  ?Stress: Not on file  ?Social Connections: Not on file  ?Intimate Partner Violence: Not on file  ? ? ?Family History  ?Problem Relation Age of Onset  ? Hypertension Mother   ? Stroke Mother   ? Diabetes Maternal Grandmother   ? Hypertension Maternal Grandmother   ? Cancer Maternal Aunt   ?     breast cancer   ? Anesthesia problems Neg Hx   ? ? ?Past Surgical History:  ?Procedure Laterality Date  ? NO PAST SURGERIES    ? ? ?ROS: ?Review of Systems ?Negative except as stated above ? ?PHYSICAL EXAM: ?BP 118/81   Pulse 81   Resp 16   Wt (!) 304 lb 9.6 oz (138.2 kg)   SpO2 95%   BMI 52.28 kg/m?   ?Wt Readings from Last 3 Encounters:  ?08/24/21 (!) 304 lb 9.6 oz (138.2 kg)  ?12/12/20 297 lb 12.8 oz (135.1 kg)  ?12/27/19 (!) 296 lb 9.6 oz (134.5 kg)  ? ? ?Physical Exam ? ?General appearance - alert, well appearing, morbidly obese middle-age African-American female and in no distress ?Mental status - normal mood, behavior, speech, dress, motor activity, and thought processes ?Chest - clear to auscultation, no wheezes, rales or rhonchi, symmetric air entry ?Heart - normal rate, regular rhythm, normal S1, S2, no murmurs, rubs, clicks or gallops ?Extremities - peripheral pulses normal, no pedal edema, no clubbing or cyanosis ? ? ? ?  Latest Ref Rng & Units 12/12/2020  ?  4:16 PM 12/27/2019  ? 10:10 AM 11/12/2018  ?  3:46 PM  ?CMP  ?Glucose 65 - 99 mg/dL 88    105   104    ?BUN 6 - 24 mg/dL '8   12   9    ' ?Creatinine 0.57 - 1.00 mg/dL 0.79   0.84   0.91    ?Sodium 134 - 144 mmol/L 140   138   138    ?Potassium 3.5 -  5.2 mmol/L 4.1   4.3   3.6    ?Chloride 96 - 106 mmol/L 95   99   95    ?CO2 20 - 29 mmol/L '27   23   27    ' ?Calcium 8.7 - 10.2 mg/dL 10.2   9.4   9.7    ?Total Protein 6.0 - 8.5 g/dL 8.0   7.4   7.6    ?Total Bilirubin 0.0 - 1.2 mg/dL 0.8   0.4   0.4    ?Alkaline Phos 44 - 121 IU/L 80   68   64    ?AST 0 - 40 IU/L '18   17   20    ' ?ALT 0 - 32 IU/L '16   27   15    ' ? ?Lipid Panel  ?   ?Component Value Date/Time  ? CHOL 135 12/12/2020 1616  ? TRIG 123 12/12/2020 1616  ? HDL 28 (L) 12/12/2020 1616  ? CHOLHDL 4.8 (H) 12/12/2020 1616  ? CHOLHDL 4.5 01/06/2014 1241  ? VLDL 17 01/06/2014 1241  ? [place] 85 12/12/2020 1616  ? ? ?CBC ?   ?Component Value Date/Time  ? WBC 8.4 12/12/2020 1616  ? WBC 8.1 08/24/2016 0042  ? RBC 5.53 (H) 12/12/2020 1616  ? RBC 5.06 08/24/2016 0042  ? HGB 13.3 12/12/2020 1616  ? HCT 43.0 12/12/2020 1616  ? PLT 429 12/12/2020 1616  ? MCV 78 (L) 12/12/2020 1616  ? MCH 24.1 (L) 12/12/2020 1616  ? MCH 24.1 (L) 08/24/2016 0042  ? MCHC 30.9 (L) 12/12/2020 1616  ? MCHC 31.4 08/24/2016 0042  ? RDW 13.9 12/12/2020 1616  ? LYMPHSABS 2.0 08/24/2016 0042  ? MONOABS 0.4 08/24/2016 0042  ? EOSABS 0.1 08/24/2016 0042  ? BASOSABS 0.0 08/24/2016 0042  ? ? ?ASSESSMENT AND PLAN: ?1. Type 2 diabetes mellitus with morbid obesity ([place]) ?A1c above goal. ?Discussed and encourage meal planning so that she is not eating out as much. ?Encouraged her to try to be more active. ?Discussed putting her on Ozempic or Trulicity to assist with diabetes control and weight loss.  Patient declined medication even though it is once weekly injection stating that she does not want to inject herself.  Rybelsus is not covered by her insurance.  She did not tolerate metformin in the past.  She is agreeable to trying Jardiance.  Advised of the importance of staying hydrated on this  medication.  Also advised that if she develops any acute illness where she has diarrhea or vomiting she needs to stop the medication until it resolves.  We will check a BMP today. ?- POCT glycosylated hemoglobin (Hb A1C)

## 2021-08-25 LAB — BASIC METABOLIC PANEL
BUN/Creatinine Ratio: 8 — ABNORMAL LOW (ref 9–23)
BUN: 6 mg/dL (ref 6–24)
CO2: 29 mmol/L (ref 20–29)
Calcium: 9.5 mg/dL (ref 8.7–10.2)
Chloride: 100 mmol/L (ref 96–106)
Creatinine, Ser: 0.74 mg/dL (ref 0.57–1.00)
Glucose: 98 mg/dL (ref 70–99)
Potassium: 4.1 mmol/L (ref 3.5–5.2)
Sodium: 140 mmol/L (ref 134–144)
eGFR: 103 mL/min/{1.73_m2} (ref >59)

## 2021-08-30 ENCOUNTER — Other Ambulatory Visit: Payer: Self-pay | Admitting: Internal Medicine

## 2021-08-30 DIAGNOSIS — I10 Essential (primary) hypertension: Secondary | ICD-10-CM

## 2021-09-10 DIAGNOSIS — F411 Generalized anxiety disorder: Secondary | ICD-10-CM | POA: Diagnosis not present

## 2021-09-10 DIAGNOSIS — F332 Major depressive disorder, recurrent severe without psychotic features: Secondary | ICD-10-CM | POA: Diagnosis not present

## 2021-09-14 ENCOUNTER — Telehealth: Payer: Self-pay

## 2021-09-14 NOTE — Telephone Encounter (Signed)
PA for Jardiance approved until 09/14/2022.  Pharmacy notified. ?

## 2021-10-24 ENCOUNTER — Other Ambulatory Visit: Payer: Self-pay | Admitting: Internal Medicine

## 2021-10-24 DIAGNOSIS — E782 Mixed hyperlipidemia: Secondary | ICD-10-CM

## 2021-12-05 DIAGNOSIS — F332 Major depressive disorder, recurrent severe without psychotic features: Secondary | ICD-10-CM | POA: Diagnosis not present

## 2021-12-05 DIAGNOSIS — F411 Generalized anxiety disorder: Secondary | ICD-10-CM | POA: Diagnosis not present

## 2022-01-20 ENCOUNTER — Other Ambulatory Visit: Payer: Self-pay | Admitting: Internal Medicine

## 2022-01-20 DIAGNOSIS — E782 Mixed hyperlipidemia: Secondary | ICD-10-CM

## 2022-02-03 ENCOUNTER — Ambulatory Visit: Payer: Medicaid Other

## 2022-02-25 ENCOUNTER — Other Ambulatory Visit: Payer: Self-pay | Admitting: Internal Medicine

## 2022-02-25 DIAGNOSIS — I10 Essential (primary) hypertension: Secondary | ICD-10-CM

## 2022-02-25 DIAGNOSIS — E782 Mixed hyperlipidemia: Secondary | ICD-10-CM

## 2022-02-25 NOTE — Telephone Encounter (Signed)
Called pt and made appt for med refill. Pt asking if can have refill to last until upcoming appt. Previous notations on reorder "OV needed for additional refills"  Last RF: amlodipine: 08/30/21 #90 1 RF               Atorvastatin: 01/21/22  #30  Overdue lab work  also   Requested Prescriptions  Pending Prescriptions Disp Refills   amLODipine (NORVASC) 5 MG tablet [Pharmacy Med Name: AMLODIPINE BESYLATE 5 MG TAB] 90 tablet 1    Sig: TAKE 1 TABLET (5 MG TOTAL) BY MOUTH DAILY.     Cardiovascular: Calcium Channel Blockers 2 Failed - 02/25/2022 12:30 PM      Failed - Valid encounter within last 6 months    Recent Outpatient Visits           6 months ago Type 2 diabetes mellitus with morbid obesity (HCC)   [place] [facility] Jonah Furno B, MD   1 year ago Type 2 diabetes mellitus with morbid obesity [facility])   [place] [facility] Marcine Matar, MD   1 year ago Essential hypertension   [facility], Amy J, NP   1 year ago Type 2 diabetes mellitus with morbid obesity [facility])   [place] [facility] Jonah Madrigal B, MD   2 years ago Controlled type 2 diabetes mellitus without complication, without long-term current use of insulin [facility])   [place] [facility] [place], [place], NP       Future Appointments             In 2 months Claiborne Rigg, NP [facility]            Passed - Last BP in normal range    BP Readings from Last 1 Encounters:  08/24/21 118/81         Passed - Last Heart Rate in normal range    Pulse Readings from Last 1 Encounters:  08/24/21 81          atorvastatin (LIPITOR) 20 MG tablet [Pharmacy Med Name: ATORVASTATIN 20 MG TABLET] 30 tablet 0    Sig: TAKE 1 TABLET BY MOUTH DAILY. OFFICE VISIT NEEDED FOR ADDITIONAL REFILLS     Cardiovascular:  Antilipid - Statins Failed - 02/25/2022 12:30 PM      Failed -  Lipid Panel in normal range within the last 12 months    Cholesterol, Total  Date Value Ref Range Status  12/12/2020 135 100 - 199 mg/dL Final   LDL Chol Calc (NIH)  Date Value Ref Range Status  12/12/2020 85 0 - 99 mg/dL Final   HDL  Date Value Ref Range Status  12/12/2020 28 (L) >39 mg/dL Final   Triglycerides  Date Value Ref Range Status  12/12/2020 123 0 - 149 mg/dL Final         Passed - Patient is not pregnant      Passed - Valid encounter within last 12 months    Recent Outpatient Visits           6 months ago Type 2 diabetes mellitus with morbid obesity (HCC)   [place] [facility] Jonah Basilio B, MD   1 year ago Type 2 diabetes mellitus with morbid obesity [facility])   [place] [facility] Marcine Matar, MD   1 year ago Essential hypertension  [facility], Amy J, NP   1 year ago Type 2 diabetes mellitus with morbid obesity [facility])   [place], MD   2 years ago Controlled type 2 diabetes mellitus without complication, without long-term current use of insulin [facility])   [place], Amy J, NP       Future Appointments             In 2 months Gildardo Pounds, NP [place]

## 2022-02-26 ENCOUNTER — Other Ambulatory Visit: Payer: Self-pay | Admitting: Internal Medicine

## 2022-02-26 DIAGNOSIS — I10 Essential (primary) hypertension: Secondary | ICD-10-CM

## 2022-02-26 NOTE — Telephone Encounter (Signed)
Requested Prescriptions  Pending Prescriptions Disp Refills  . hydrochlorothiazide (HYDRODIURIL) 25 MG tablet [Pharmacy Med Name: HYDROCHLOROTHIAZIDE 25 MG TAB] 90 tablet 0    Sig: TAKE 1 TABLET (25 MG TOTAL) BY MOUTH DAILY.     Cardiovascular: Diuretics - Thiazide Failed - 02/26/2022  5:13 AM      Failed - Cr in normal range and within 180 days    Creatinine, Ser  Date Value Ref Range Status  08/24/2021 0.74 0.57 - 1.00 mg/dL Final         Failed - K in normal range and within 180 days    Potassium  Date Value Ref Range Status  08/24/2021 4.1 3.5 - 5.2 mmol/L Final         Failed - Na in normal range and within 180 days    Sodium  Date Value Ref Range Status  08/24/2021 140 134 - 144 mmol/L Final         Failed - Valid encounter within last 6 months    Recent Outpatient Visits          6 months ago Type 2 diabetes mellitus with morbid obesity ([place])   [place], Deborah B, MD   1 year ago Type 2 diabetes mellitus with morbid obesity ([place])   [place], Deborah B, MD   1 year ago Essential hypertension   [facility], Amy J, NP   1 year ago Type 2 diabetes mellitus with morbid obesity [facility])   [place], Deborah B, MD   2 years ago Controlled type 2 diabetes mellitus without complication, without long-term current use of insulin [facility])   [place], [place], NP      Future Appointments            In 2 months Gildardo Pounds, NP [place] - Last BP in normal range    BP Readings from Last 1 Encounters:  08/24/21 118/81

## 2022-03-04 DIAGNOSIS — F411 Generalized anxiety disorder: Secondary | ICD-10-CM | POA: Diagnosis not present

## 2022-03-04 DIAGNOSIS — F332 Major depressive disorder, recurrent severe without psychotic features: Secondary | ICD-10-CM | POA: Diagnosis not present

## 2022-03-27 ENCOUNTER — Encounter: Payer: Self-pay | Admitting: Nurse Practitioner

## 2022-03-27 ENCOUNTER — Ambulatory Visit: Payer: Medicaid Other | Attending: Nurse Practitioner | Admitting: Nurse Practitioner

## 2022-03-27 DIAGNOSIS — L209 Atopic dermatitis, unspecified: Secondary | ICD-10-CM | POA: Diagnosis not present

## 2022-03-27 DIAGNOSIS — E1169 Type 2 diabetes mellitus with other specified complication: Secondary | ICD-10-CM

## 2022-03-27 DIAGNOSIS — Z6841 Body Mass Index (BMI) 40.0 and over, adult: Secondary | ICD-10-CM

## 2022-03-27 DIAGNOSIS — Z1159 Encounter for screening for other viral diseases: Secondary | ICD-10-CM | POA: Diagnosis not present

## 2022-03-27 DIAGNOSIS — Z23 Encounter for immunization: Secondary | ICD-10-CM

## 2022-03-27 DIAGNOSIS — F411 Generalized anxiety disorder: Secondary | ICD-10-CM | POA: Diagnosis not present

## 2022-03-27 DIAGNOSIS — I10 Essential (primary) hypertension: Secondary | ICD-10-CM | POA: Diagnosis not present

## 2022-03-27 DIAGNOSIS — E782 Mixed hyperlipidemia: Secondary | ICD-10-CM | POA: Diagnosis not present

## 2022-03-27 DIAGNOSIS — Z1231 Encounter for screening mammogram for malignant neoplasm of breast: Secondary | ICD-10-CM

## 2022-03-27 LAB — POCT GLYCOSYLATED HEMOGLOBIN (HGB A1C): HbA1c, POC (controlled diabetic range): 6.6 % (ref 0.0–7.0)

## 2022-03-27 MED ORDER — TRIAMCINOLONE ACETONIDE 0.025 % EX OINT: 1.0000 | TOPICAL_OINTMENT | Freq: Two times a day (BID) | CUTANEOUS | 1 refills | Status: DC

## 2022-03-27 MED ORDER — EMPAGLIFLOZIN 10 MG PO TABS: 10.0000 mg | ORAL_TABLET | Freq: Every day | ORAL | 1 refills | Status: DC

## 2022-03-27 MED ORDER — ATORVASTATIN CALCIUM 20 MG PO TABS: 20.0000 mg | ORAL_TABLET | Freq: Every day | ORAL | 1 refills | Status: DC

## 2022-03-27 MED ORDER — HYDROCHLOROTHIAZIDE 25 MG PO TABS: 25.0000 mg | ORAL_TABLET | Freq: Every day | ORAL | 1 refills | Status: DC

## 2022-03-27 MED ORDER — AMLODIPINE BESYLATE 5 MG PO TABS: 5.0000 mg | ORAL_TABLET | Freq: Every day | ORAL | 1 refills | Status: DC

## 2022-03-27 NOTE — Progress Notes (Signed)
Assessment & Plan:  Whitney White was seen today for diabetes.  Diagnoses and all orders for this visit:  Type 2 diabetes mellitus with morbid obesity ([place]) -     POCT glycosylated hemoglobin (Hb A1C) -     empagliflozin (JARDIANCE) 10 MG TABS tablet; Take 1 tablet (10 mg total) by mouth daily before breakfast. -     Microalbumin / creatinine urine ratio -     CMP14+EGFR -     Ambulatory referral to Ophthalmology  Mixed hyperlipidemia -     atorvastatin (LIPITOR) 20 MG tablet; Take 1 tablet (20 mg total) by mouth daily. -     Lipid panel INSTRUCTIONS: Work on a low fat, heart healthy diet and participate in regular aerobic exercise program by working out at least 150 minutes per week; 5 days a week-30 minutes per day. Avoid red meat/beef/steak,  fried foods. junk foods, sodas, sugary drinks, unhealthy snacking, alcohol and smoking.  Drink at least 80 oz of water per day and monitor your carbohydrate intake daily.    Essential hypertension -     hydrochlorothiazide (HYDRODIURIL) 25 MG tablet; Take 1 tablet (25 mg total) by mouth daily. -     amLODipine (NORVASC) 5 MG tablet; Take 1 tablet (5 mg total) by mouth daily. Continue all antihypertensives as prescribed.  Reminded to bring in blood pressure log for follow  up appointment.  RECOMMENDATIONS: DASH/Mediterranean Diets are healthier choices for HTN.    Breast cancer screening by mammogram -     MM 3D SCREEN BREAST BILATERAL; Future  Need for hepatitis C screening test -     HCV Ab w Reflex to Quant PCR  Atopic dermatitis, unspecified type -     triamcinolone (KENALOG) 0.025 % ointment; Apply 1 Application topically 2 (two) times daily.  Generalized anxiety disorder Followed by behavioral health. Lexapro was increased from 10 mg to 15m  Need for immunization against influenza -     Flu Vaccine QUAD 645moM (Fluarix, Fluzone & Alfiuria Quad PF)  Need for Tdap vaccination -     Tdap vaccine greater than or equal to 7yo  IM    Patient has been counseled on age-appropriate routine health concerns for screening and prevention. These are reviewed and up-to-date. Referrals have been placed accordingly. Immunizations are up-to-date or declined.    Subjective:   Chief Complaint  Patient presents with   Diabetes   HPI Whitney PAMER46.o. female presents to office today for follow up to HTN and HPL  She ran out jardiance and it has been refilled today.  She has a past medical history of Depression, DM2, Eczema, GERD, Gestational hypertension, and Hypertension.   DM 2 Well controlled. Weight is down as she has also joined a gym and has been walking on the treadmill for exercise.  LDL not quite at goal however she endorses adherence taking atorvastatin 20 mg daily. Lab Results  Component Value Date   HGBA1C 6.6 03/27/2022    Lab Results  Component Value Date   HGBA1C 7.1 (A) 08/24/2021   Lab Results  Component Value Date   LDLCALC 85 12/12/2020     HTN Blood pressure is well controlled with amlodipine 5 mg daily and HCTZ 25 mg daily.  BP Readings from Last 3 Encounters:  03/27/22 120/80  08/24/21 118/81  12/12/20 108/72    Review of Systems  Constitutional:  Negative for fever, malaise/fatigue and weight loss.  HENT: Negative.  Negative for nosebleeds.   Eyes:  Negative.  Negative for blurred vision, double vision and photophobia.  Respiratory: Negative.  Negative for cough and shortness of breath.   Cardiovascular: Negative.  Negative for chest pain, palpitations and leg swelling.  Gastrointestinal: Negative.  Negative for heartburn, nausea and vomiting.  Musculoskeletal: Negative.  Negative for myalgias.  Skin:  Positive for itching and rash.       History of eczema   Neurological: Negative.  Negative for dizziness, focal weakness, seizures and headaches.  Psychiatric/Behavioral: Negative.  Negative for suicidal ideas.     Past Medical History:  Diagnosis Date   Depression     Diabetes mellitus without complication ([place])    "pre-diabetic"   Eczema    GERD (gastroesophageal reflux disease)    Gestational hypertension    Hypertension     Past Surgical History:  Procedure Laterality Date   NO PAST SURGERIES      Family History  Problem Relation Age of Onset   Hypertension Mother    Stroke Mother    Diabetes Maternal Grandmother    Hypertension Maternal Grandmother    Cancer Maternal Aunt        breast cancer    Anesthesia problems Neg Hx     Social History Reviewed with no changes to be made today.   Outpatient Medications Prior to Visit  Medication Sig Dispense Refill   Blood Glucose Monitoring Suppl (TRUE METRIX METER) w/Device KIT Use as directed 1 kit 0   escitalopram (LEXAPRO) 20 MG tablet Take 20 mg by mouth daily.     glucose blood (TRUE METRIX BLOOD GLUCOSE TEST) test strip Use as instructed 100 each 12   levonorgestrel (MIRENA) 20 MCG/24HR IUD 1 each by Intrauterine route once.     TRUEplus Lancets 28G MISC Use as directed 100 each 5   amLODipine (NORVASC) 5 MG tablet TAKE 1 TABLET (5 MG TOTAL) BY MOUTH DAILY. 90 tablet 0   atorvastatin (LIPITOR) 20 MG tablet TAKE 1 TABLET BY MOUTH DAILY. OFFICE VISIT NEEDED FOR ADDITIONAL REFILLS 90 tablet 0   escitalopram (LEXAPRO) 10 MG tablet Take 1 tablet (10 mg total) by mouth daily. (Patient taking differently: Take 20 mg by mouth daily.) 30 tablet 6   hydrochlorothiazide (HYDRODIURIL) 25 MG tablet TAKE 1 TABLET (25 MG TOTAL) BY MOUTH DAILY. 90 tablet 0   traZODone (DESYREL) 50 MG tablet TAKE 0.5 1 TABLET BY MOUTH AT BEDTIME FOR SLEEP     hydrOXYzine (ATARAX/VISTARIL) 25 MG tablet Take 12.5 mg by mouth 2 (two) times daily as needed. (Patient not taking: Reported on 03/27/2022)     traZODone (DESYREL) 100 MG tablet Take 50-100 mg by mouth at bedtime. (Patient not taking: Reported on 03/27/2022)     empagliflozin (JARDIANCE) 10 MG TABS tablet Take 1 tablet (10 mg total) by mouth daily before breakfast.  (Patient not taking: Reported on 03/27/2022) 30 tablet 3   hydrocortisone (ANUSOL-HC) 25 MG suppository Place 1 suppository (25 mg total) rectally 2 (two) times daily as needed for hemorrhoids or anal itching. 12 suppository 0   No facility-administered medications prior to visit.    Allergies  Allergen Reactions   Lisinopril Cough   Metformin And Related     Caused palpitations/heart pounding       Objective:    BP 120/80   Pulse 80   Temp 98 F (36.7 C) (Temporal)   Ht '5\' 2"'  (1.575 m)   Wt 281 lb 3.2 oz (127.6 kg)   SpO2 98%   BMI 51.43 kg/m  Wt Readings from Last 3 Encounters:  03/27/22 281 lb 3.2 oz (127.6 kg)  08/24/21 (!) 304 lb 9.6 oz (138.2 kg)  12/12/20 297 lb 12.8 oz (135.1 kg)    Physical Exam Vitals and nursing note reviewed.  Constitutional:      Appearance: She is well-developed.  HENT:     Head: Normocephalic and atraumatic.  Cardiovascular:     Rate and Rhythm: Normal rate and regular rhythm.     Heart sounds: Normal heart sounds. No murmur heard.    No friction rub. No gallop.  Pulmonary:     Effort: Pulmonary effort is normal. No tachypnea or respiratory distress.     Breath sounds: Normal breath sounds. No decreased breath sounds, wheezing, rhonchi or rales.  Chest:     Chest wall: No tenderness.  Abdominal:     General: Bowel sounds are normal.     Palpations: Abdomen is soft.  Musculoskeletal:        General: Normal range of motion.     Cervical back: Normal range of motion.  Skin:    General: Skin is warm and dry.     Findings: Rash present. Rash is macular.     Comments: Atopic dermatitis on bilateral hands, forehead and chin  Neurological:     Mental Status: She is alert and oriented to person, place, and time.     Coordination: Coordination normal.  Psychiatric:        Behavior: Behavior normal. Behavior is cooperative.        Thought Content: Thought content normal.        Judgment: Judgment normal.          Patient has  been counseled extensively about nutrition and exercise as well as the importance of adherence with medications and regular follow-up. The patient was given clear instructions to go to ER or return to medical center if symptoms don't improve, worsen or new problems develop. The patient verbalized understanding.   Follow-up: Return in about 6 months (around 09/26/2022) for HTN/DM WITH PCP.   Gildardo Pounds, FNP-BC [facility] [place], [place]   03/27/2022, 1:43 PM

## 2022-03-28 LAB — LIPID PANEL
Chol/HDL Ratio: 4.1 ratio (ref 0.0–4.4)
Cholesterol, Total: 139 mg/dL (ref 100–199)
HDL: 34 mg/dL — ABNORMAL LOW (ref >39)
LDL Chol Calc (NIH): 85 mg/dL (ref 0–99)
Triglycerides: 109 mg/dL (ref 0–149)
VLDL Cholesterol Cal: 20 mg/dL (ref 5–40)

## 2022-03-28 LAB — CMP14+EGFR
ALT: 15 IU/L (ref 0–32)
AST: 15 IU/L (ref 0–40)
Albumin/Globulin Ratio: 1.7 (ref 1.2–2.2)
Albumin: 4.6 g/dL (ref 3.9–4.9)
Alkaline Phosphatase: 68 IU/L (ref 44–121)
BUN/Creatinine Ratio: 12 (ref 9–23)
BUN: 9 mg/dL (ref 6–24)
Bilirubin Total: 0.7 mg/dL (ref 0.0–1.2)
CO2: 28 mmol/L (ref 20–29)
Calcium: 9.8 mg/dL (ref 8.7–10.2)
Chloride: 96 mmol/L (ref 96–106)
Creatinine, Ser: 0.78 mg/dL (ref 0.57–1.00)
Globulin, Total: 2.7 g/dL (ref 1.5–4.5)
Glucose: 96 mg/dL (ref 70–99)
Potassium: 3.8 mmol/L (ref 3.5–5.2)
Sodium: 141 mmol/L (ref 134–144)
Total Protein: 7.3 g/dL (ref 6.0–8.5)
eGFR: 96 mL/min/{1.73_m2} (ref >59)

## 2022-03-28 LAB — HCV AB W REFLEX TO QUANT PCR: HCV Ab: NONREACTIVE

## 2022-03-28 LAB — HCV INTERPRETATION

## 2022-03-29 LAB — MICROALBUMIN / CREATININE URINE RATIO
Creatinine, Urine: 49.6 mg/dL
Microalb/Creat Ratio: 7 mg/g creat (ref 0–29)
Microalbumin, Urine: 3.4 ug/mL

## 2022-04-03 DIAGNOSIS — F411 Generalized anxiety disorder: Secondary | ICD-10-CM | POA: Diagnosis not present

## 2022-04-03 DIAGNOSIS — F332 Major depressive disorder, recurrent severe without psychotic features: Secondary | ICD-10-CM | POA: Diagnosis not present

## 2022-04-05 DIAGNOSIS — F419 Anxiety disorder, unspecified: Secondary | ICD-10-CM | POA: Diagnosis not present

## 2022-04-12 DIAGNOSIS — F418 Other specified anxiety disorders: Secondary | ICD-10-CM | POA: Diagnosis not present

## 2022-04-14 ENCOUNTER — Other Ambulatory Visit: Payer: Self-pay

## 2022-04-14 ENCOUNTER — Ambulatory Visit
Admission: RE | Admit: 2022-04-14 | Discharge: 2022-04-14 | Disposition: A | Discharge status: Home / Self Care | Payer: Medicaid Other | Source: Ambulatory Visit | Attending: Physician Assistant | Admitting: Physician Assistant

## 2022-04-14 VITALS — BP 117/71 | HR 97 | Temp 99.7°F | Resp 18

## 2022-04-14 DIAGNOSIS — Z1152 Encounter for screening for COVID-19: Secondary | ICD-10-CM

## 2022-04-14 DIAGNOSIS — J069 Acute upper respiratory infection, unspecified: Secondary | ICD-10-CM | POA: Diagnosis not present

## 2022-04-14 LAB — RESP PANEL BY RT-PCR (FLU A&B, COVID) ARPGX2
Influenza A by PCR: NEGATIVE
Influenza B by PCR: NEGATIVE
SARS Coronavirus 2 by RT PCR: POSITIVE — AB

## 2022-04-14 NOTE — ED Triage Notes (Signed)
Pt sts cough and pain with cough x 4 days; pt sts some chills

## 2022-04-14 NOTE — ED Provider Notes (Signed)
[name]    CSN: [id number] Arrival date & time: 04/14/22  1049      History   Chief Complaint Chief Complaint  Patient presents with   Cough    Makes chest hurt when i cough - Entered by patient    HPI Whitney White is a 46 y.o. female.   Patient here today for evaluation of cough, congestion that is been ongoing for last 4 days.  She reports that she has had some chills.  She does have low-grade fever.  She notes some central chest pain with cough.  She denies sore throat or ear pain.  She does work at the [employer].  The history is provided by the patient.  Cough Associated symptoms: fever   Associated symptoms: no ear pain, no eye discharge, no shortness of breath, no sore throat and no wheezing     Past Medical History:  Diagnosis Date   Depression    Diabetes mellitus without complication ([place])    "pre-diabetic"   Eczema    GERD (gastroesophageal reflux disease)    Gestational hypertension    Hypertension     Patient Active Problem List   Diagnosis Date Noted   Periodic limb movement 03/10/2017   RLS (restless legs syndrome) 66/11/3014   Mild diastolic dysfunction 06/11/3233   Prediabetes 11/26/2016   Hyperlipidemia 11/26/2016   Anxiety state, unspecified 12/22/2013   Obesity, unspecified 12/22/2013   Chest pain 12/17/2013   Depression    Hypertension    Eczema    Gestational hypertension    GERD (gastroesophageal reflux disease)     Past Surgical History:  Procedure Laterality Date   NO PAST SURGERIES      OB History     Gravida  4   Para  4   Term  4   Preterm  0   AB  0   Living  4      SAB  0   IAB  0   Ectopic  0   Multiple  0   Live Births  1            Home Medications    Prior to Admission medications   Medication Sig Start Date End Date Taking? Authorizing Provider  amLODipine (NORVASC) 5 MG tablet Take 1 tablet (5 mg total) by mouth daily. 03/27/22   Gildardo Pounds, NP  atorvastatin  (LIPITOR) 20 MG tablet Take 1 tablet (20 mg total) by mouth daily. 03/27/22   Gildardo Pounds, NP  Blood Glucose Monitoring Suppl (TRUE METRIX METER) w/Device KIT Use as directed 12/30/19   Camillia Herter, NP  empagliflozin (JARDIANCE) 10 MG TABS tablet Take 1 tablet (10 mg total) by mouth daily before breakfast. 03/27/22   Gildardo Pounds, NP  escitalopram (LEXAPRO) 20 MG tablet Take 20 mg by mouth daily.    [provider]  glucose blood (TRUE METRIX BLOOD GLUCOSE TEST) test strip Use as instructed 12/27/19   Camillia Herter, NP  hydrochlorothiazide (HYDRODIURIL) 25 MG tablet Take 1 tablet (25 mg total) by mouth daily. 03/27/22   Gildardo Pounds, NP  hydrOXYzine (ATARAX/VISTARIL) 25 MG tablet Take 12.5 mg by mouth 2 (two) times daily as needed. Patient not taking: Reported on 03/27/2022 10/02/20   [provider]  levonorgestrel (MIRENA) 20 MCG/24HR IUD 1 each by Intrauterine route once.    [provider]  traZODone (DESYREL) 100 MG tablet Take 50-100 mg by mouth at bedtime. Patient not  taking: Reported on 03/27/2022 10/02/20   [provider]  triamcinolone (KENALOG) 0.025 % ointment Apply 1 Application topically 2 (two) times daily. 03/27/22   Gildardo Pounds, NP  TRUEplus Lancets 28G MISC Use as directed 12/27/19   Camillia Herter, NP    Family History Family History  Problem Relation Age of Onset   Hypertension Mother    Stroke Mother    Diabetes Maternal Grandmother    Hypertension Maternal Grandmother    Cancer Maternal Aunt        breast cancer    Anesthesia problems Neg Hx     Social History Social History   Tobacco Use   Smoking status: Former   Smokeless tobacco: Never  Scientific laboratory technician Use: Never used  Substance Use Topics   Alcohol use: Not Currently    Comment: occaisonally   Drug use: No     Allergies   Lisinopril and Metformin and related   Review of Systems Review of Systems  Constitutional:  Positive for fever.   HENT:  Positive for congestion. Negative for ear pain and sore throat.   Eyes:  Negative for discharge and redness.  Respiratory:  Positive for cough. Negative for shortness of breath and wheezing.   Gastrointestinal:  Negative for abdominal pain, diarrhea, nausea and vomiting.     Physical Exam Triage Vital Signs ED Triage Vitals [04/14/22 1124]  Enc Vitals Group     BP 117/71     Pulse Rate 97     Resp 18     Temp 99.7 F (37.6 C)     Temp Source Oral     SpO2 95 %     Weight      Height      Head Circumference      Peak Flow      Pain Score 3     Pain Loc      Pain Edu?      Excl. in [place]?    No data found.  Updated Vital Signs BP 117/71 (BP Location: Left Arm)   Pulse 97   Temp 99.7 F (37.6 C) (Oral)   Resp 18   SpO2 95%     Physical Exam Vitals and nursing note reviewed.  Constitutional:      General: She is not in acute distress.    Appearance: Normal appearance. She is not ill-appearing.  HENT:     Head: Normocephalic and atraumatic.     Right Ear: Tympanic membrane normal.     Left Ear: Tympanic membrane normal.     Nose: Congestion present.     Mouth/Throat:     Mouth: Mucous membranes are moist.     Pharynx: No oropharyngeal exudate or posterior oropharyngeal erythema.  Eyes:     Conjunctiva/sclera: Conjunctivae normal.  Cardiovascular:     Rate and Rhythm: Normal rate and regular rhythm.     Heart sounds: Normal heart sounds. No murmur heard. Pulmonary:     Effort: Pulmonary effort is normal. No respiratory distress.     Breath sounds: Normal breath sounds. No wheezing, rhonchi or rales.  Skin:    General: Skin is warm and dry.  Neurological:     Mental Status: She is alert.  Psychiatric:        Mood and Affect: Mood normal.        Thought Content: Thought content normal.      UC Treatments / Results  Labs (all labs ordered are listed, but  only abnormal results are displayed) Labs Reviewed  RESP PANEL BY RT-PCR (FLU A&B, COVID)  ARPGX2    EKG   Radiology No results found.  Procedures Procedures (including critical care time)  Medications Ordered in UC Medications - No data to display  Initial Impression / Assessment and Plan / UC Course  I have reviewed the triage vital signs and the nursing notes.  Pertinent labs & imaging results that were available during my care of the patient were reviewed by me and considered in my medical decision making (see chart for details).   Suspect viral etiology of symptoms.  Will order screening for COVID and flu.  Recommend symptomatic treatment with rest and increase fluids while awaiting results.  Encouraged follow-up with any further concerns.   Final Clinical Impressions(s) / UC Diagnoses   Final diagnoses:  Acute upper respiratory infection  Encounter for screening for COVID-19   Discharge Instructions   None    ED Prescriptions   None    PDMP not reviewed this encounter.   Francene Finders, PA-C 04/14/22 1225

## 2022-04-17 ENCOUNTER — Encounter: Payer: Self-pay | Admitting: Internal Medicine

## 2022-04-19 DIAGNOSIS — F418 Other specified anxiety disorders: Secondary | ICD-10-CM | POA: Diagnosis not present

## 2022-04-26 DIAGNOSIS — F4389 Other reactions to severe stress: Secondary | ICD-10-CM | POA: Diagnosis not present

## 2022-04-29 ENCOUNTER — Ambulatory Visit: Payer: Medicaid Other | Admitting: Nurse Practitioner

## 2022-04-30 DIAGNOSIS — F332 Major depressive disorder, recurrent severe without psychotic features: Secondary | ICD-10-CM | POA: Diagnosis not present

## 2022-04-30 DIAGNOSIS — F411 Generalized anxiety disorder: Secondary | ICD-10-CM | POA: Diagnosis not present

## 2022-05-01 ENCOUNTER — Ambulatory Visit: Payer: Medicaid Other

## 2022-05-03 DIAGNOSIS — F411 Generalized anxiety disorder: Secondary | ICD-10-CM | POA: Diagnosis not present

## 2022-05-03 DIAGNOSIS — F4389 Other reactions to severe stress: Secondary | ICD-10-CM | POA: Diagnosis not present

## 2022-05-10 DIAGNOSIS — F418 Other specified anxiety disorders: Secondary | ICD-10-CM | POA: Diagnosis not present

## 2022-05-24 DIAGNOSIS — F411 Generalized anxiety disorder: Secondary | ICD-10-CM | POA: Diagnosis not present

## 2022-05-24 DIAGNOSIS — F4389 Other reactions to severe stress: Secondary | ICD-10-CM | POA: Diagnosis not present

## 2022-06-06 ENCOUNTER — Encounter: Payer: Self-pay | Admitting: Family Medicine

## 2022-06-06 ENCOUNTER — Ambulatory Visit: Payer: Medicaid Other | Admitting: Family Medicine

## 2022-06-06 ENCOUNTER — Other Ambulatory Visit (HOSPITAL_COMMUNITY)
Admission: RE | Admit: 2022-06-06 | Discharge: 2022-06-06 | Disposition: A | Discharge status: Home / Self Care | Payer: Medicaid Other | Source: Ambulatory Visit | Attending: Family Medicine | Admitting: Family Medicine

## 2022-06-06 VITALS — BP 127/67 | HR 81 | Ht 62.0 in | Wt 271.3 lb

## 2022-06-06 DIAGNOSIS — N939 Abnormal uterine and vaginal bleeding, unspecified: Secondary | ICD-10-CM | POA: Diagnosis not present

## 2022-06-06 DIAGNOSIS — Z114 Encounter for screening for human immunodeficiency virus [HIV]: Secondary | ICD-10-CM

## 2022-06-06 DIAGNOSIS — Z1159 Encounter for screening for other viral diseases: Secondary | ICD-10-CM | POA: Diagnosis not present

## 2022-06-06 DIAGNOSIS — Z113 Encounter for screening for infections with a predominantly sexual mode of transmission: Secondary | ICD-10-CM | POA: Insufficient documentation

## 2022-06-06 DIAGNOSIS — Z124 Encounter for screening for malignant neoplasm of cervix: Secondary | ICD-10-CM | POA: Diagnosis not present

## 2022-06-06 NOTE — Assessment & Plan Note (Signed)
Patient with IUD in place.  Over the last few months has had mild vaginal bleeding.  She reports that for 5 years she had no vaginal bleeding after placement of the IUD.  Discussed that she may notice increased bleeding.  Is considering pregnancy with a new partner but not at this time.  Discussed that we could remove and replace the IUD if the symptoms were bothersome we will remove if she would like to get pregnant.  She is going to speak with her partner and make a decision and schedule follow-up appointment depending on what she decides.  Discussed that pregnancy prevention IUD is approved for 8 years for menorrhagia and is approved for 6 years and patient is understanding.  No further questions or concerns.

## 2022-06-06 NOTE — Assessment & Plan Note (Signed)
Pap smear collected today 

## 2022-06-06 NOTE — Progress Notes (Signed)
    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  Retest in 2020 within normal limits.  Is currently due for Pap smear.  Screening for STDs Patient currently sexually active with 1 partner.  Would like routine STD screening including blood work.  Denies any symptoms at this time.  Vaginal bleeding Patient with IUD in place for the last 5-6 years.  IUD was placed May 2018.  She had no period for the last 5 years but has recently started having spotting.  OBJECTIVE:   BP 127/67   Pulse 81   Ht 5\' 2"  (1.575 m)   Wt 271 lb 4.8 oz (123.1 kg)   BMI 49.62 kg/m   General: Well-appearing 47 year old female in no acute distress Cardiac: Regular rate Respiratory: Normal for breathing, speaking in full sentences GU: Normal external vaginal tissue, normal internal vaginal tissue, cervix with no friability or ectopy.  Mild amount of bleeding from cervical os.  No cervical motion tenderness appreciated  ASSESSMENT/PLAN:   Vaginal bleeding Patient with IUD in place.  Over the last few months has had mild vaginal bleeding.  She reports that for 5 years she had no vaginal bleeding after placement of the IUD.  Discussed that she may notice increased bleeding.  Is considering pregnancy with a new partner but not at this time.  Discussed that we could remove and replace the IUD if the symptoms were bothersome we will remove if she would like to get pregnant.  She is going to speak with her partner and make a decision and schedule follow-up appointment depending on what she decides.  Discussed that pregnancy prevention IUD is approved for 8 years for menorrhagia and is approved for 6 years and patient is understanding.  No further questions or concerns.  Screening examination for STD (sexually transmitted disease) GC/chlamydia as well as HIV, RPR, hep C, hep B collected today.  Will call with results if any  abnormalities or send MyChart message if all is normal.  Screening for cervical cancer Pap smear collected today.     Concepcion Living, MD [place]

## 2022-06-06 NOTE — Assessment & Plan Note (Signed)
GC/chlamydia as well as HIV, RPR, hep C, hep B collected today.  Will call with results if any abnormalities or send MyChart message if all is normal.

## 2022-06-07 DIAGNOSIS — F411 Generalized anxiety disorder: Secondary | ICD-10-CM | POA: Diagnosis not present

## 2022-06-07 LAB — CERVICOVAGINAL ANCILLARY ONLY
Bacterial Vaginitis (gardnerella): POSITIVE — AB
Candida Glabrata: NEGATIVE
Candida Vaginitis: NEGATIVE
Chlamydia: NEGATIVE
Comment: NEGATIVE
Comment: NEGATIVE
Comment: NEGATIVE
Comment: NEGATIVE
Comment: NEGATIVE
Comment: NORMAL
Neisseria Gonorrhea: NEGATIVE
Trichomonas: NEGATIVE

## 2022-06-07 LAB — HEPATITIS C ANTIBODY: Hep C Virus Ab: NONREACTIVE

## 2022-06-07 LAB — HIV ANTIBODY (ROUTINE TESTING W REFLEX): HIV Screen 4th Generation wRfx: NONREACTIVE

## 2022-06-07 LAB — HEPATITIS B SURFACE ANTIGEN: Hepatitis B Surface Ag: NEGATIVE

## 2022-06-07 LAB — RPR: RPR Ser Ql: NONREACTIVE

## 2022-06-11 LAB — CYTOLOGY - PAP
Adequacy: ABSENT
Comment: NEGATIVE
Diagnosis: NEGATIVE
High risk HPV: NEGATIVE

## 2022-06-14 DIAGNOSIS — F411 Generalized anxiety disorder: Secondary | ICD-10-CM | POA: Diagnosis not present

## 2022-06-20 ENCOUNTER — Encounter: Payer: Self-pay | Admitting: Internal Medicine

## 2022-06-20 ENCOUNTER — Ambulatory Visit
Admission: RE | Admit: 2022-06-20 | Discharge: 2022-06-20 | Disposition: A | Discharge status: Home / Self Care | Payer: Medicaid Other | Source: Ambulatory Visit | Attending: Physician Assistant | Admitting: Physician Assistant

## 2022-06-20 ENCOUNTER — Other Ambulatory Visit: Payer: Self-pay | Admitting: Internal Medicine

## 2022-06-20 VITALS — BP 120/80 | HR 89 | Temp 99.9°F | Resp 22

## 2022-06-20 DIAGNOSIS — J209 Acute bronchitis, unspecified: Secondary | ICD-10-CM

## 2022-06-20 DIAGNOSIS — E1169 Type 2 diabetes mellitus with other specified complication: Secondary | ICD-10-CM

## 2022-06-20 DIAGNOSIS — J069 Acute upper respiratory infection, unspecified: Secondary | ICD-10-CM | POA: Diagnosis not present

## 2022-06-20 DIAGNOSIS — R051 Acute cough: Secondary | ICD-10-CM

## 2022-06-20 MED ORDER — ALBUTEROL SULFATE HFA 108 (90 BASE) MCG/ACT IN AERS
2.0000 | INHALATION_SPRAY | Freq: Once | RESPIRATORY_TRACT | Status: AC
  Administered 2022-06-20: 2 via RESPIRATORY_TRACT

## 2022-06-20 MED ORDER — ACCU-CHEK GUIDE VI STRP: ORAL_STRIP | 12 refills | Status: AC

## 2022-06-20 MED ORDER — DM-GUAIFENESIN ER 30-600 MG PO TB12: 1.0000 | ORAL_TABLET | Freq: Two times a day (BID) | ORAL | 0 refills | Status: DC

## 2022-06-20 MED ORDER — ACCU-CHEK FASTCLIX LANCET KIT: PACK | 3 refills | Status: AC

## 2022-06-20 MED ORDER — PREDNISONE 10 MG PO TABS: 10.0000 mg | ORAL_TABLET | Freq: Three times a day (TID) | ORAL | 0 refills | Status: DC

## 2022-06-20 MED ORDER — AEROCHAMBER PLUS FLO-VU MEDIUM MISC: 1.0000 | Freq: Once | Status: DC

## 2022-06-20 MED ORDER — ACCU-CHEK GUIDE ME W/DEVICE KIT: PACK | 0 refills | Status: AC

## 2022-06-20 MED ORDER — SPACER/AERO-HOLDING CHAMBERS DEVI: 1.0000 [IU] | Freq: Three times a day (TID) | 0 refills | Status: AC

## 2022-06-20 NOTE — ED Provider Notes (Signed)
[name]    CSN: [id number] Arrival date & time: 06/20/22  1301      History   Chief Complaint Chief Complaint  Patient presents with   Cough    Entered by patient   Appointment    HPI Whitney White is a 47 y.o. female.   47 year old female presents with cough and congestion.  Patient indicates for the past 3 days she has been having increasing upper respiratory congestion with sinus pressure and pain frontal and maxillary, increased watering of the eyes bilaterally, rhinitis and postnasal drip with mainly clear production.  Patient also indicates she been having cough, recurrent, with coughing spasms that keeps her up at night.  Patient indicates that she has not able to get up production as it is thick and difficult to expel, she has been having mild wheezing and shortness of breath associated with the cough.  She indicates she has been taken multiple OTC medications for the cough and to thin the secretions but nothing has worked.  She indicates she did has been having some mild fatigue but she has not had any fever, chills, body aches or pain.  Indicates she does not recall being around any coworkers or family that has been sick.  Patient was without nausea, or vomiting.   Cough Associated symptoms: rhinorrhea, shortness of breath and wheezing     Past Medical History:  Diagnosis Date   Depression    Diabetes mellitus without complication ([place])    "pre-diabetic"   Eczema    GERD (gastroesophageal reflux disease)    Gestational hypertension    Hypertension     Patient Active Problem List   Diagnosis Date Noted   Screening for cervical cancer 06/06/2022   Screening examination for STD (sexually transmitted disease) 06/06/2022   Vaginal bleeding 06/06/2022   Periodic limb movement 03/10/2017   RLS (restless legs syndrome) 78/29/5621   Mild diastolic dysfunction 30/86/5784   Prediabetes 11/26/2016   Hyperlipidemia 11/26/2016   Anxiety state, unspecified  12/22/2013   Obesity, unspecified 12/22/2013   Chest pain 12/17/2013   Depression    Hypertension    Eczema    Gestational hypertension    GERD (gastroesophageal reflux disease)     Past Surgical History:  Procedure Laterality Date   NO PAST SURGERIES      OB History     Gravida  4   Para  4   Term  4   Preterm  0   AB  0   Living  4      SAB  0   IAB  0   Ectopic  0   Multiple  0   Live Births  1            Home Medications    Prior to Admission medications   Medication Sig Start Date End Date Taking? Authorizing Provider  dextromethorphan-guaiFENesin (MUCINEX DM) 30-600 MG 12hr tablet Take 1 tablet by mouth 2 (two) times daily. 06/20/22  Yes Nyoka Lint, PA-C  predniSONE (DELTASONE) 10 MG tablet Take 1 tablet (10 mg total) by mouth in the morning, at noon, and at bedtime. 06/20/22  Yes Nyoka Lint, PA-C  Spacer/Aero-Holding Chambers DEVI 1 Units by Does not apply route in the morning, at noon, and at bedtime. 06/20/22  Yes Nyoka Lint, PA-C  amLODipine (NORVASC) 5 MG tablet Take 1 tablet (5 mg total) by mouth daily. 03/27/22   Gildardo Pounds, NP  atorvastatin (LIPITOR) 20 MG tablet Take 1  tablet (20 mg total) by mouth daily. 03/27/22   Claiborne Rigg, NP  Blood Glucose Monitoring Suppl (TRUE METRIX METER) w/Device KIT Use as directed 12/30/19   Rema Fendt, NP  empagliflozin (JARDIANCE) 10 MG TABS tablet Take 1 tablet (10 mg total) by mouth daily before breakfast. 03/27/22   Claiborne Rigg, NP  escitalopram (LEXAPRO) 20 MG tablet Take 20 mg by mouth daily.    [provider]  glucose blood (TRUE METRIX BLOOD GLUCOSE TEST) test strip Use as instructed 12/27/19   Rema Fendt, NP  hydrochlorothiazide (HYDRODIURIL) 25 MG tablet Take 1 tablet (25 mg total) by mouth daily. 03/27/22   Claiborne Rigg, NP  hydrOXYzine (ATARAX/VISTARIL) 25 MG tablet Take 12.5 mg by mouth 2 (two) times daily as needed. 10/02/20   [provider]   levonorgestrel (MIRENA) 20 MCG/24HR IUD 1 each by Intrauterine route once.    [provider]  traZODone (DESYREL) 100 MG tablet Take 50-100 mg by mouth at bedtime. Patient not taking: Reported on 03/27/2022 10/02/20   [provider]  triamcinolone (KENALOG) 0.025 % ointment Apply 1 Application topically 2 (two) times daily. 03/27/22   Claiborne Rigg, NP  TRUEplus Lancets 28G MISC Use as directed 12/27/19   Rema Fendt, NP    Family History Family History  Problem Relation Age of Onset   Hypertension Mother    Stroke Mother    Diabetes Maternal Grandmother    Hypertension Maternal Grandmother    Cancer Maternal Aunt        breast cancer    Anesthesia problems Neg Hx     Social History Social History   Tobacco Use   Smoking status: Former   Smokeless tobacco: Never  Building services engineer Use: Never used  Substance Use Topics   Alcohol use: Not Currently    Comment: occaisonally   Drug use: No     Allergies   Lisinopril and Metformin and related   Review of Systems Review of Systems  HENT:  Positive for postnasal drip and rhinorrhea.   Respiratory:  Positive for cough, shortness of breath and wheezing.      Physical Exam Triage Vital Signs ED Triage Vitals [06/20/22 1325]  Enc Vitals Group     BP 120/80     Pulse Rate 89     Resp (!) 22     Temp 99.9 F (37.7 C)     Temp Source Oral     SpO2 94 %     Weight      Height      Head Circumference      Peak Flow      Pain Score 0     Pain Loc      Pain Edu?      Excl. in GC?    No data found.  Updated Vital Signs BP 120/80 (BP Location: Left Arm)   Pulse 89   Temp 99.9 F (37.7 C) (Oral)   Resp (!) 22   SpO2 94%   Visual Acuity Right Eye Distance:   Left Eye Distance:   Bilateral Distance:    Right Eye Near:   Left Eye Near:    Bilateral Near:     Physical Exam Constitutional:      Appearance: Normal appearance.  HENT:     Right Ear: Ear canal normal. Tympanic  membrane is injected.     Left Ear: Ear canal normal. Tympanic membrane is injected.  Mouth/Throat:     Mouth: Mucous membranes are moist.     Pharynx: Oropharynx is clear.  Cardiovascular:     Rate and Rhythm: Normal rate and regular rhythm.     Heart sounds: Normal heart sounds.  Pulmonary:     Effort: Pulmonary effort is normal.     Breath sounds: Normal breath sounds and air entry. No wheezing, rhonchi or rales.  Lymphadenopathy:     Cervical: No cervical adenopathy.  Neurological:     Mental Status: She is alert.      UC Treatments / Results  Labs (all labs ordered are listed, but only abnormal results are displayed) Labs Reviewed - No data to display  EKG   Radiology No results found.  Procedures Procedures (including critical care time)  Medications Ordered in UC Medications  albuterol (VENTOLIN HFA) 108 (90 Base) MCG/ACT inhaler 2 puff (2 puffs Inhalation Given 06/20/22 1344)    Initial Impression / Assessment and Plan / UC Course  I have reviewed the triage vital signs and the nursing notes.  Pertinent labs & imaging results that were available during my care of the patient were reviewed by me and considered in my medical decision making (see chart for details).    Plan: 1.  The acute upper respiratory infection be treated with the following: A.  Mucinex DM every 12 hours for cough and congestion. 2.  The acute bronchitis will be treated with the following: A.  Mucinex DM every 12 hours to treat cough and congestion. B.  Prednisone 10 mg every 8 hours to treat respiratory inflammation. C.  Albuterol inhaler with spacer, 2 puffs every 6 hours for cough, congestion, wheezing. 3.  The acute cough will be treated with the following: A.  Mucinex DM every 12 hours for cough and congestion. 4.  Patient advised follow-up PCP or return to urgent care if symptoms fail to improve. Final Clinical Impressions(s) / UC Diagnoses   Final diagnoses:  Acute upper  respiratory infection  Acute bronchitis, unspecified organism  Acute cough     Discharge Instructions      Advised take the Mucinex DM every 12 hours to help control cough and congestion. Advise use the albuterol inhaler with spacer, 2 puffs every 6 hours on a regular basis to help decrease cough and congestion. Advised take prednisone 10 mg 3 times a day until completed.  (Watch her glucose levels because this may raise the glucose while on this medication)  Advised to drink plenty of fluids to help loosen up the chest congestion.  Advised follow-up PCP or return to urgent care if symptoms fail to improve.    ED Prescriptions     Medication Sig Dispense Auth. Provider   predniSONE (DELTASONE) 10 MG tablet Take 1 tablet (10 mg total) by mouth in the morning, at noon, and at bedtime. 15 tablet Nyoka Lint, PA-C   dextromethorphan-guaiFENesin [facility] DM) 30-600 MG 12hr tablet Take 1 tablet by mouth 2 (two) times daily. 20 tablet Nyoka Lint, PA-C   Spacer/Aero-Holding Chambers DEVI 1 Units by Does not apply route in the morning, at noon, and at bedtime. 1 Units Nyoka Lint, PA-C      PDMP not reviewed this encounter.   Nyoka Lint, PA-C 06/20/22 1352

## 2022-06-20 NOTE — ED Triage Notes (Signed)
Patient presents to UC for cough, runny nose since 2-3 days ago. Alos reports bilateral eye drainage, has improved some. Unable to cough up mucous. Taking allergy and cough med with no changes.   Denies fever.

## 2022-06-20 NOTE — Discharge Instructions (Signed)
Advised take the Mucinex DM every 12 hours to help control cough and congestion. Advise use the albuterol inhaler with spacer, 2 puffs every 6 hours on a regular basis to help decrease cough and congestion. Advised take prednisone 10 mg 3 times a day until completed.  (Watch her glucose levels because this may raise the glucose while on this medication)  Advised to drink plenty of fluids to help loosen up the chest congestion.  Advised follow-up PCP or return to urgent care if symptoms fail to improve.

## 2022-06-21 DIAGNOSIS — F4389 Other reactions to severe stress: Secondary | ICD-10-CM | POA: Diagnosis not present

## 2022-06-21 DIAGNOSIS — F411 Generalized anxiety disorder: Secondary | ICD-10-CM | POA: Diagnosis not present

## 2022-06-24 ENCOUNTER — Ambulatory Visit: Payer: Medicaid Other

## 2022-06-26 DIAGNOSIS — F411 Generalized anxiety disorder: Secondary | ICD-10-CM | POA: Diagnosis not present

## 2022-06-26 DIAGNOSIS — F332 Major depressive disorder, recurrent severe without psychotic features: Secondary | ICD-10-CM | POA: Diagnosis not present

## 2022-06-29 DIAGNOSIS — F411 Generalized anxiety disorder: Secondary | ICD-10-CM | POA: Diagnosis not present

## 2022-07-05 DIAGNOSIS — F411 Generalized anxiety disorder: Secondary | ICD-10-CM | POA: Diagnosis not present

## 2022-07-11 ENCOUNTER — Ambulatory Visit
Admission: RE | Admit: 2022-07-11 | Discharge: 2022-07-11 | Disposition: A | Discharge status: Home / Self Care | Payer: Medicaid Other | Source: Ambulatory Visit | Attending: Urgent Care | Admitting: Urgent Care

## 2022-07-11 ENCOUNTER — Ambulatory Visit (INDEPENDENT_AMBULATORY_CARE_PROVIDER_SITE_OTHER): Payer: Medicaid Other

## 2022-07-11 VITALS — BP 135/90 | HR 89 | Temp 98.5°F | Resp 22

## 2022-07-11 DIAGNOSIS — M25561 Pain in right knee: Secondary | ICD-10-CM | POA: Diagnosis not present

## 2022-07-11 MED ORDER — PREDNISONE 20 MG PO TABS: ORAL_TABLET | ORAL | 0 refills | Status: DC

## 2022-07-11 NOTE — ED Triage Notes (Signed)
Pt c/o right knee pain started 2/4-denies injury

## 2022-07-11 NOTE — ED Provider Notes (Signed)
[facility]  Note:  This document was prepared using Systems analyst and may include unintentional dictation errors.  MRN: [id number] DOB: 08-Nov-1975  Subjective:   Whitney White is a 47 y.o. female presenting for 4-day history of acute onset per send moderate to severe right knee pain over the patellar tendon.  No injury, trauma, fall, swelling, redness, hot sensation.  No history of gout.  Last A1c was 6.6% 03/27/2022.  For her work, patient does a lot of standing and walking.  No current facility-administered medications for this encounter.  Current Outpatient Medications:    Blood Glucose Monitoring Suppl (ACCU-CHEK GUIDE ME) w/Device KIT, UAD, Disp: 1 kit, Rfl: 0   glucose blood (ACCU-CHEK GUIDE) test strip, Check BS once a day, Disp: 100 each, Rfl: 12   Lancets Misc. (ACCU-CHEK FASTCLIX LANCET) KIT, UAD, Disp: 100 kit, Rfl: 3   amLODipine (NORVASC) 5 MG tablet, Take 1 tablet (5 mg total) by mouth daily., Disp: 90 tablet, Rfl: 1   atorvastatin (LIPITOR) 20 MG tablet, Take 1 tablet (20 mg total) by mouth daily., Disp: 90 tablet, Rfl: 1   dextromethorphan-guaiFENesin (MUCINEX DM) 30-600 MG 12hr tablet, Take 1 tablet by mouth 2 (two) times daily., Disp: 20 tablet, Rfl: 0   empagliflozin (JARDIANCE) 10 MG TABS tablet, Take 1 tablet (10 mg total) by mouth daily before breakfast., Disp: 90 tablet, Rfl: 1   escitalopram (LEXAPRO) 20 MG tablet, Take 20 mg by mouth daily., Disp: , Rfl:    hydrochlorothiazide (HYDRODIURIL) 25 MG tablet, Take 1 tablet (25 mg total) by mouth daily., Disp: 90 tablet, Rfl: 1   hydrOXYzine (ATARAX/VISTARIL) 25 MG tablet, Take 12.5 mg by mouth 2 (two) times daily as needed., Disp: , Rfl:    levonorgestrel (MIRENA) 20 MCG/24HR IUD, 1 each by Intrauterine route once., Disp: , Rfl:    predniSONE (DELTASONE) 10 MG tablet, Take 1 tablet (10 mg total) by mouth in the morning, at noon, and at bedtime., Disp: 15 tablet, Rfl: 0    Spacer/Aero-Holding Chambers DEVI, 1 Units by Does not apply route in the morning, at noon, and at bedtime., Disp: 1 Units, Rfl: 0   traZODone (DESYREL) 100 MG tablet, Take 50-100 mg by mouth at bedtime. (Patient not taking: Reported on 03/27/2022), Disp: , Rfl:    triamcinolone (KENALOG) 0.025 % ointment, Apply 1 Application topically 2 (two) times daily., Disp: 60 g, Rfl: 1   TRUEplus Lancets 28G MISC, Use as directed, Disp: 100 each, Rfl: 5   Allergies  Allergen Reactions   Lisinopril Cough   Metformin And Related     Caused palpitations/heart pounding    Past Medical History:  Diagnosis Date   Depression    Diabetes mellitus without complication ([place])    "pre-diabetic"   Eczema    GERD (gastroesophageal reflux disease)    Gestational hypertension    Hypertension      Past Surgical History:  Procedure Laterality Date   NO PAST SURGERIES      Family History  Problem Relation Age of Onset   Hypertension Mother    Stroke Mother    Diabetes Maternal Grandmother    Hypertension Maternal Grandmother    Cancer Maternal Aunt        breast cancer    Anesthesia problems Neg Hx     Social History   Tobacco Use   Smoking status: Former   Smokeless tobacco: Never  Vaping Use   Vaping Use: Never used  Substance Use Topics   Alcohol use: Not Currently    Comment: occaisonally   Drug use: No    ROS   Objective:   Vitals: BP (!) 135/90 (BP Location: Right Arm)   Pulse 89   Temp 98.5 F (36.9 C) (Oral)   Resp (!) 22   SpO2 93%   Physical Exam Constitutional:      General: She is not in acute distress.    Appearance: Normal appearance. She is well-developed. She is obese. She is not ill-appearing, toxic-appearing or diaphoretic.  HENT:     Head: Normocephalic and atraumatic.     Nose: Nose normal.     Mouth/Throat:     Mouth: Mucous membranes are moist.  Eyes:     General: No scleral icterus.       Right eye: No discharge.        Left eye: No discharge.      Extraocular Movements: Extraocular movements intact.  Cardiovascular:     Rate and Rhythm: Normal rate.  Pulmonary:     Effort: Pulmonary effort is normal.  Musculoskeletal:     Right knee: Bony tenderness present. No swelling, deformity, effusion, erythema, ecchymosis, lacerations or crepitus. Decreased range of motion. Tenderness present over the patellar tendon. No medial joint line or lateral joint line tenderness. Normal alignment and normal patellar mobility.  Skin:    General: Skin is warm and dry.  Neurological:     General: No focal deficit present.     Mental Status: She is alert and oriented to person, place, and time.  Psychiatric:        Mood and Affect: Mood normal.        Behavior: Behavior normal.     Assessment and Plan :   PDMP not reviewed this encounter.  1. Acute pain of right knee     X-ray over-read was pending at time of discharge, recommended follow up with only abnormal results. Otherwise will not call for negative over-read. Patient was in agreement.  Given severity of her pain, recommended oral prednisone course.  Follow-up with [facility].  Will update treatment plan as necessary following the x-ray report. Counseled patient on potential for adverse effects with medications prescribed/recommended today, ER and return-to-clinic precautions discussed, patient verbalized understanding.    Jaynee Eagles, PA-C 07/11/22 1225

## 2022-07-12 ENCOUNTER — Ambulatory Visit: Payer: Self-pay

## 2022-07-12 ENCOUNTER — Ambulatory Visit: Payer: Medicaid Other | Admitting: Sports Medicine

## 2022-07-12 VITALS — BP 131/74 | Ht 62.0 in | Wt 270.0 lb

## 2022-07-12 DIAGNOSIS — F411 Generalized anxiety disorder: Secondary | ICD-10-CM | POA: Diagnosis not present

## 2022-07-12 DIAGNOSIS — M25561 Pain in right knee: Secondary | ICD-10-CM | POA: Diagnosis not present

## 2022-07-12 MED ORDER — MELOXICAM 15 MG PO TABS: 15.0000 mg | ORAL_TABLET | Freq: Every day | ORAL | 1 refills | Status: DC

## 2022-07-12 NOTE — Progress Notes (Signed)
  Whitney White - 47 y.o. female MRN [id number]  Date of birth: [birth year]    CHIEF COMPLAINT:   R knee pain    SUBJECTIVE:   HPI:  Pleasant 47 year old female comes to the sports medicine clinic as a new patient to be evaluated for right knee pain.  She says her right knee started hurting her about 5 days ago on Sunday.  She reports a sharp shooting pain is on the medial aspect of the knee.  It is random and will hurt when she is both weightbearing and when she is not weightbearing.  She does report some mechanical symptoms of clicking and catching when she walks.  The pain does not radiate.  She does not have any pain over the lateral or posterior aspect of the knee.  She denies any inciting injury or trauma.  She is only been taking Tylenol extra strength which has only given her mild symptomatic improvement.  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  I do appreciate some mild medial compartment joint space narrowing when I reviewed her images.  The urgent care prescribed her a prednisone taper but she has not started taking that yet.  She denies any numbness or tingling down the leg.  ROS:     See HPI  PERTINENT  PMH / PSH FH / / SH:  Past Medical, Surgical, Social, and Family History Reviewed & Updated in the EMR.  Pertinent findings include:  Obesity  OBJECTIVE: BP 131/74   Ht 5\' 2"  (1.575 m)   Wt 270 lb (122.5 kg)   BMI 49.38 kg/m   Physical Exam:  Vital signs are reviewed.  GEN: Alert and oriented, NAD Pulm: Breathing unlabored PSY: normal mood, congruent affect  MSK: Right knee -no obvious deformity.  No erythema.  Tender to palpation at the medial joint line.  Nontender to palpation of the lateral joint line or the patella or the tibial tubercle.  Range of motion knee is full.  5/5 strength throughout.  She has no ligamentous instability with valgus varus  stressing.  Firm endpoint on Lachman exam limited by habitus.  Positive McMurray's test.  ULTRASOUND: MSK ultrasound knee: Images were obtained both in the transverse and longitudinal plane. Patellar and quadriceps tendons were well visualized with no abnormalities. No effusion. Medial and lateral menisci were  visualized. Some hypoechoic changes at medial joint line  Impression: Small medial joint line effusion   ASSESSMENT & PLAN:  1. R Knee Pain, Acute  -Etiology of her symptoms is difficult to ascertain. Concern for arthritis vs meniscus pathology.  Will start by treating this conservatively with physical therapy prescription.  I will have her start taking the prednisone that was prescribed by the urgent care yesterday and follow that up with Mobic after she has completed the prednisone course.  I offered her a intra-articular corticosteroid injection today but she declined.  If her pain gets worse she will call back to make an appointment to schedule an injection.  If no improvement after 6 weeks of physical therapy then we will obtain an MRI.  All questions were answered and she agreed to plan.  Whitney Kern, MD PGY-4, Sports Medicine Fellow [place]  Addendum:  I was the preceptor for this visit and available for immediate consultation.  Whitney Lemon MD Whitney White

## 2022-07-19 DIAGNOSIS — F411 Generalized anxiety disorder: Secondary | ICD-10-CM | POA: Diagnosis not present

## 2022-07-24 ENCOUNTER — Ambulatory Visit: Payer: Medicaid Other | Admitting: Physical Therapy

## 2022-07-24 NOTE — Therapy (Deleted)
OUTPATIENT PHYSICAL THERAPY LOWER EXTREMITY EVALUATION  Patient Name: Whitney White MRN: [id number] DOB:05-Jul-1975, 47 y.o., female Today's Date: 07/24/2022    Past Medical History:  Diagnosis Date   Depression    Diabetes mellitus without complication ([place])    "pre-diabetic"   Eczema    GERD (gastroesophageal reflux disease)    Gestational hypertension    Hypertension    Past Surgical History:  Procedure Laterality Date   NO PAST SURGERIES     Patient Active Problem List   Diagnosis Date Noted   Screening for cervical cancer 06/06/2022   Screening examination for STD (sexually transmitted disease) 06/06/2022   Vaginal bleeding 06/06/2022   Periodic limb movement 03/10/2017   RLS (restless legs syndrome) 123456   Mild diastolic dysfunction [id number]   Prediabetes 11/26/2016   Hyperlipidemia 11/26/2016   Anxiety state, unspecified 12/22/2013   Obesity, unspecified 12/22/2013   Chest pain 12/17/2013   Depression    Hypertension    Eczema    Gestational hypertension    GERD (gastroesophageal reflux disease)     PCP: Ladell Pier, MD  REFERRING PROVIDER: Dene Gentry, MD  THERAPY DIAG:  No diagnosis found.  REFERRING DIAG: Acute pain of right knee [M25.561]   Rationale for Evaluation and Treatment:  Rehabilitation  SUBJECTIVE:  PERTINENT PAST HISTORY:  Anxiety, obesity, prediabetes,         PRECAUTIONS: {Therapy precautions:24002}  WEIGHT BEARING RESTRICTIONS {Yes ***/No:24003}  FALLS:  Has patient fallen in last 6 months? {yes/no:20286}, Number of falls: ***  MOI/History of condition:  Onset date: acute/sub acute (early February)  SUBJECTIVE STATEMENT  Whitney White is a 47 y.o. female who presents to clinic with chief complaint of ***.  ***  From referring provider:   "-Etiology of her symptoms is difficult to ascertain. Concern for arthritis vs meniscus pathology.  Will start by treating this conservatively with physical therapy  prescription.  I will have her start taking the prednisone that was prescribed by the urgent care yesterday and follow that up with Mobic after she has completed the prednisone course.  I offered her a intra-articular corticosteroid injection today but she declined.  If her pain gets worse she will call back to make an appointment to schedule an injection.  If no improvement after 6 weeks of physical therapy then we will obtain an MRI.  All questions were answered and she agreed to plan. "   Red flags:  {has/denies:26543} {kerredflag:26542}  Pain:  Are you having pain? {yes/no:20286} Pain location: *** NPRS scale:  {NUMBERS; 0-10:5044}/10 to {NUMBERS; 0-10:5044}/10 Aggravating factors: *** Relieving factors: *** Pain description: {PAIN DESCRIPTION:21022940} Stage: {Desc; acute/subacute/chronic:13799} Stability: {kerbetterworse:26715} 24 hour pattern: ***   Occupation: ***  Assistive Device: ***  Hand Dominance: ***  Patient Goals/Specific Activities: ***   OBJECTIVE:   DIAGNOSTIC FINDINGS:   ULTRASOUND: MSK ultrasound knee: Images were obtained both in the transverse and longitudinal plane. Patellar and quadriceps tendons were well visualized with no abnormalities. No effusion. Medial and lateral menisci were  visualized. Some hypoechoic changes at medial joint line   Impression: Small medial joint line effusion   GENERAL OBSERVATION/GAIT:   ***  SENSATION:  Light touch: {intact/deficits:24005}  PALPATION: ***  MUSCLE LENGTH: Hamstrings: Right {kerminsig:27227} restriction; Left {kerminsig:27227} restriction ASLR: Right {keraslr:27228}; Left {keraslr:27228} Marcello Moores test: Right {kerminsig:27227} restriction; Left {kerminsig:27227} restriction Ely's test: Right {kerminsig:27227} restriction; Left {kerminsig:27227} restriction  LE MMT:  MMT Right 07/24/2022 Left 07/24/2022  Hip flexion (L2, L3) *** ***  Knee  extension (L3) *** ***  Knee flexion *** ***  Hip  abduction *** ***  Hip extension *** ***  Hip external rotation    Hip internal rotation    Hip adduction    Ankle dorsiflexion (L4)    Ankle plantarflexion (S1)    Ankle inversion    Ankle eversion    Great Toe ext (L5)    Grossly     (Blank rows = not tested, score listed is out of 5 possible points.  N = WNL, D = diminished, C = clear for gross weakness with myotome testing, * = concordant pain with testing)  LE ROM:  ROM Right 07/24/2022 Left 07/24/2022  Hip flexion    Hip extension    Hip abduction    Hip adduction    Hip internal rotation    Hip external rotation    Knee flexion    Knee extension    Ankle dorsiflexion    Ankle plantarflexion    Ankle inversion    Ankle eversion     (Blank rows = not tested, N = WNL, * = concordant pain with testing)  Functional Tests  Eval (07/24/2022)                                                              LOWER EXTREMITY SPECIAL TESTS:  Knee special tests: {KNEE SPECIAL IT:6829840   PATIENT SURVEYS:  {rehab surveys:24030}   TODAY'S TREATMENT: ***   PATIENT EDUCATION:  POC, diagnosis, prognosis, HEP, and outcome measures.  Pt educated via explanation, demonstration, and handout (HEP).  Pt confirms understanding verbally.   HOME EXERCISE PROGRAM: ***  Treatment priorities   Eval (07/24/2022)                                                  ASSESSMENT:  CLINICAL IMPRESSION: Whitney White is a 47 y.o. female who presents to clinic with signs and sxs consistent with ***.    OBJECTIVE IMPAIRMENTS: Pain, ***  ACTIVITY LIMITATIONS: ***  PERSONAL FACTORS: See medical history and pertinent history   REHAB POTENTIAL: {rehabpotential:25112}  CLINICAL DECISION MAKING: {clinical decision making:25114}  EVALUATION COMPLEXITY: {Evaluation complexity:25115}   GOALS:   SHORT TERM GOALS: Target date: 08/21/2022  Whitney White will be >75% HEP compliant to improve carryover between sessions and  facilitate independent management of condition  Evaluation (07/24/2022): ongoing Goal status: INITIAL   LONG TERM GOALS: Target date: 09/18/2022  ***   2.  ***   3.  ***   4.  ***   5.  ***   6.  ***   PLAN: PT FREQUENCY: 1-2x/week  PT DURATION: 8 weeks (Ending 09/18/2022)  PLANNED INTERVENTIONS: Therapeutic exercises, Aquatic therapy, Therapeutic activity, Neuro Muscular re-education, Gait training, Patient/Family education, Joint mobilization, Dry Needling, Electrical stimulation, Spinal mobilization and/or manipulation, Moist heat, Taping, Vasopneumatic device, Ionotophoresis 82m/ml Dexamethasone, and Manual therapy  PLAN FOR NEXT SESSION: ***   KShearon BaloPT, DPT 07/24/2022, 9:55 AM

## 2022-07-26 DIAGNOSIS — F411 Generalized anxiety disorder: Secondary | ICD-10-CM | POA: Diagnosis not present

## 2022-07-26 DIAGNOSIS — Z630 Problems in relationship with spouse or partner: Secondary | ICD-10-CM | POA: Diagnosis not present

## 2022-08-02 DIAGNOSIS — F411 Generalized anxiety disorder: Secondary | ICD-10-CM | POA: Diagnosis not present

## 2022-08-08 ENCOUNTER — Telehealth: Payer: Self-pay | Admitting: Emergency Medicine

## 2022-08-08 NOTE — Telephone Encounter (Signed)
Copied from [place] [id number]. Topic: General - Other >> Aug 08, 2022 11:12 AM Oley Balm A wrote: Reason for CRM: Pt states that she is wanting to get a sleep study done, she is having trouble sleeping. Please call pt.

## 2022-08-09 DIAGNOSIS — F411 Generalized anxiety disorder: Secondary | ICD-10-CM | POA: Diagnosis not present

## 2022-08-09 DIAGNOSIS — Z6379 Other stressful life events affecting family and household: Secondary | ICD-10-CM | POA: Diagnosis not present

## 2022-08-12 NOTE — Telephone Encounter (Signed)
Called & spoke to the patient. Verified name & DOB. Informed that an appointment will be needed to address concerns. Confirmed with patient that concerns can be addressed on next visit 09/26/2022 & approved by Dr.Johnson. Patient agreed and expressed verbal understanding.

## 2022-08-16 DIAGNOSIS — F411 Generalized anxiety disorder: Secondary | ICD-10-CM | POA: Diagnosis not present

## 2022-08-23 DIAGNOSIS — F411 Generalized anxiety disorder: Secondary | ICD-10-CM | POA: Diagnosis not present

## 2022-08-23 DIAGNOSIS — Z564 Discord with boss and workmates: Secondary | ICD-10-CM | POA: Diagnosis not present

## 2022-09-03 ENCOUNTER — Other Ambulatory Visit: Payer: Self-pay | Admitting: Nurse Practitioner

## 2022-09-04 NOTE — Telephone Encounter (Signed)
Requested Prescriptions  Pending Prescriptions Disp Refills   JARDIANCE 10 MG TABS tablet [Pharmacy Med Name: JARDIANCE 10 MG TABLET] 90 tablet 1    Sig: TAKE 1 TABLET BY MOUTH DAILY BEFORE BREAKFAST.     Endocrinology:  Diabetes - SGLT2 Inhibitors Passed - 09/03/2022 10:11 PM      Passed - Cr in normal range and within 360 days    Creatinine, Ser  Date Value Ref Range Status  03/27/2022 0.78 0.57 - 1.00 mg/dL Final         Passed - HBA1C is between 0 and 7.9 and within 180 days    HbA1c, POC (prediabetic range)  Date Value Ref Range Status  12/27/2019 6.4 5.7 - 6.4 % Final   HbA1c, POC (controlled diabetic range)  Date Value Ref Range Status  03/27/2022 6.6 0.0 - 7.0 % Final         Passed - eGFR in normal range and within 360 days    GFR calc Af Amer  Date Value Ref Range Status  12/27/2019 99 >59 mL/min/1.73 Final    Comment:    **[facility] currently reports eGFR in compliance with the current**   recommendations of the [employer]. [facility] will   update reporting as new guidelines are published from the NKF-ASN   Task force.    GFR calc non Af Amer  Date Value Ref Range Status  12/27/2019 86 >59 mL/min/1.73 Final   eGFR  Date Value Ref Range Status  03/27/2022 96 >59 mL/min/1.73 Final         Passed - Valid encounter within last 6 months    Recent Outpatient Visits           5 months ago Type 2 diabetes mellitus with morbid obesity [facility])   [place], [place] W, NP   1 year ago Type 2 diabetes mellitus with morbid obesity [facility])   [place] Kar[place] B, MD   1 year ago Type 2 diabetes mellitus with morbid obesity [facility])   [place] Ladell Pier, MD   1 year ago Essential hypertension   [facility] [place], NP   2 years ago Type 2 diabetes mellitus with morbid obesity [facility])   [place], MD       Future Appointments             In 3 weeks Ladell Pier, MD [place]

## 2022-09-06 DIAGNOSIS — F411 Generalized anxiety disorder: Secondary | ICD-10-CM | POA: Diagnosis not present

## 2022-09-06 DIAGNOSIS — Z566 Other physical and mental strain related to work: Secondary | ICD-10-CM | POA: Diagnosis not present

## 2022-09-09 ENCOUNTER — Ambulatory Visit
Admission: RE | Admit: 2022-09-09 | Discharge: 2022-09-09 | Disposition: A | Discharge status: Home / Self Care | Payer: Medicaid Other | Source: Ambulatory Visit | Attending: Internal Medicine | Admitting: Internal Medicine

## 2022-09-09 ENCOUNTER — Ambulatory Visit (INDEPENDENT_AMBULATORY_CARE_PROVIDER_SITE_OTHER): Payer: Medicaid Other

## 2022-09-09 VITALS — BP 134/78 | HR 80 | Temp 98.0°F | Resp 22

## 2022-09-09 DIAGNOSIS — J209 Acute bronchitis, unspecified: Secondary | ICD-10-CM | POA: Insufficient documentation

## 2022-09-09 DIAGNOSIS — R0602 Shortness of breath: Secondary | ICD-10-CM

## 2022-09-09 DIAGNOSIS — Z1152 Encounter for screening for COVID-19: Secondary | ICD-10-CM | POA: Insufficient documentation

## 2022-09-09 MED ORDER — ALBUTEROL SULFATE HFA 108 (90 BASE) MCG/ACT IN AERS
2.0000 | INHALATION_SPRAY | Freq: Once | RESPIRATORY_TRACT | Status: AC
  Administered 2022-09-09: 2 via RESPIRATORY_TRACT

## 2022-09-09 MED ORDER — DEXAMETHASONE SODIUM PHOSPHATE 10 MG/ML IJ SOLN
10.0000 mg | Freq: Once | INTRAMUSCULAR | Status: AC
  Administered 2022-09-09: 10 mg via INTRAMUSCULAR

## 2022-09-09 MED ORDER — ALBUTEROL SULFATE HFA 108 (90 BASE) MCG/ACT IN AERS: 1.0000 | INHALATION_SPRAY | Freq: Four times a day (QID) | RESPIRATORY_TRACT | 0 refills | Status: DC | PRN

## 2022-09-09 MED ORDER — BENZONATATE 100 MG PO CAPS: 100.0000 mg | ORAL_CAPSULE | Freq: Three times a day (TID) | ORAL | 0 refills | Status: DC

## 2022-09-09 NOTE — ED Triage Notes (Signed)
Pt c/o wheezing, SOB, diarrhea, double vision (comes and goes "when I get upset about something," not happening in triage.)  Onset ~ Friday

## 2022-09-09 NOTE — Discharge Instructions (Addendum)
You have bronchitis which is inflammation of the upper airways in your lungs due to a virus. The following medicines will help with your symptoms.   COVID testing is pending and will come back in the next 12-24 hours, staff will call you if this is positive.  - I gave you a shot of steroid that will keep working in your body over the next 2-3 days to help with shortness of breath and wheezing. - You may use albuterol inhaler 1 to 2 puffs every 4-6 hours as needed for cough, shortness of breath, and wheezing. - Take cough medicines as prescribed.  - Continue using over the counter medicines as needed as directed. Plain mucinex (guaifenesin) over the counter may further help breakup mucus and help with symptoms.   If you develop any new or worsening symptoms or do not improve in the next 2 to 3 days, please return.  If your symptoms are severe, please go to the emergency room.  Follow-up with your primary care provider for further evaluation and management of your symptoms as well as ongoing wellness visits.  I hope you feel better!

## 2022-09-09 NOTE — ED Provider Notes (Signed)
[name]    CSN: [id number] Arrival date & time: 09/09/22  1206      History   Chief Complaint Chief Complaint  Patient presents with   Shortness of Breath    HPI Whitney White is a 47 y.o. female.   Patient presents to urgent care for evaluation of wheezing, cough, and shortness of breath that started 3 days ago on Friday September 06, 2022. Started having diarrhea last night and has had multiple episodes of loose stools overnight. States she had an upper respiratory infection a couple of weeks ago where she was given an albuterol inhaler. She has been using this for symptoms and cannot tell if it is helping or not. Reports significant dyspnea with walking short distances over the last 3 days. No chest pain, heart palpitations, nausea, vomiting, abdominal pain, rash, body aches, blood/mucous to the stools, or dizziness. No vision changes. Over the last few days while sick with congestion and cough, she has had to use an extra pillow to prop herself up to sleep without getting short of breath. No leg swelling. No fever, no recent antibiotics. Last had URI in early February 2024 and had steroid then. She is a type 2 diabetic and has been taking her medications as prescribed. She has not been checking her sugars at home. She's had a decreased appetite while sick. Took tylenol without much relief when symptoms first started. Former smoker, denies drug use. No history of chronic respiratory problems.    Shortness of Breath   Past Medical History:  Diagnosis Date   Depression    Diabetes mellitus without complication    "pre-diabetic"   Eczema    GERD (gastroesophageal reflux disease)    Gestational hypertension    Hypertension     Patient Active Problem List   Diagnosis Date Noted   Screening for cervical cancer 06/06/2022   Screening examination for STD (sexually transmitted disease) 06/06/2022   Vaginal bleeding 06/06/2022   Periodic limb movement 03/10/2017   RLS  (restless legs syndrome) 01/26/2017   Mild diastolic dysfunction 12/06/2016   Prediabetes 11/26/2016   Hyperlipidemia 11/26/2016   Anxiety state, unspecified 12/22/2013   Obesity, unspecified 12/22/2013   Chest pain 12/17/2013   Depression    Hypertension    Eczema    Gestational hypertension    GERD (gastroesophageal reflux disease)     Past Surgical History:  Procedure Laterality Date   NO PAST SURGERIES      OB History     Gravida  4   Para  4   Term  4   Preterm  0   AB  0   Living  4      SAB  0   IAB  0   Ectopic  0   Multiple  0   Live Births  1            Home Medications    Prior to Admission medications   Medication Sig Start Date End Date Taking? Authorizing Provider  albuterol (VENTOLIN HFA) 108 (90 Base) MCG/ACT inhaler Inhale 1-2 puffs into the lungs every 6 (six) hours as needed for wheezing or shortness of breath. 09/09/22  Yes Carlisle BeersStanhope, Lacheryl Niesen M, FNP  benzonatate (TESSALON) 100 MG capsule Take 1 capsule (100 mg total) by mouth every 8 (eight) hours. 09/09/22  Yes Carlisle BeersStanhope, Deon Duer M, FNP  Blood Glucose Monitoring Suppl (ACCU-CHEK GUIDE ME) w/Device KIT UAD 06/20/22   Marcine MatarJohnson, Deborah B, MD  glucose blood (ACCU-CHEK  GUIDE) test strip Check BS once a day 06/20/22   Marcine Matar, MD  Lancets Misc. (ACCU-CHEK FASTCLIX LANCET) KIT UAD 06/20/22   Marcine Matar, MD  amLODipine (NORVASC) 5 MG tablet Take 1 tablet (5 mg total) by mouth daily. 03/27/22   Claiborne Rigg, NP  atorvastatin (LIPITOR) 20 MG tablet Take 1 tablet (20 mg total) by mouth daily. 03/27/22   Claiborne Rigg, NP  dextromethorphan-guaiFENesin [facility] DM) 30-600 MG 12hr tablet Take 1 tablet by mouth 2 (two) times daily. 06/20/22   Ellsworth Lennox, PA-C  empagliflozin (JARDIANCE) 10 MG TABS tablet TAKE 1 TABLET BY MOUTH DAILY BEFORE BREAKFAST. 09/04/22   Claiborne Rigg, NP  escitalopram (LEXAPRO) 20 MG tablet Take 20 mg by mouth daily.    [provider]   hydrochlorothiazide (HYDRODIURIL) 25 MG tablet Take 1 tablet (25 mg total) by mouth daily. 03/27/22   Claiborne Rigg, NP  hydrOXYzine (ATARAX/VISTARIL) 25 MG tablet Take 12.5 mg by mouth 2 (two) times daily as needed. 10/02/20   [provider]  levonorgestrel (MIRENA) 20 MCG/24HR IUD 1 each by Intrauterine route once.    [provider]  meloxicam (MOBIC) 15 MG tablet Take 1 tablet (15 mg total) by mouth daily. 07/12/22   Lenda Kelp, MD  Spacer/Aero-Holding Deretha [place] DEVI 1 Units by Does not apply route in the morning, at noon, and at bedtime. 06/20/22   Ellsworth Lennox, PA-C  traZODone (DESYREL) 100 MG tablet Take 50-100 mg by mouth at bedtime. Patient not taking: Reported on 03/27/2022 10/02/20   [provider]  triamcinolone (KENALOG) 0.025 % ointment Apply 1 Application topically 2 (two) times daily. 03/27/22   Claiborne Rigg, NP  TRUEplus Lancets 28G MISC Use as directed 12/27/19   Rema Fendt, NP    Family History Family History  Problem Relation Age of Onset   Hypertension Mother    Stroke Mother    Diabetes Maternal Grandmother    Hypertension Maternal Grandmother    Cancer Maternal Aunt        breast cancer    Anesthesia problems Neg Hx     Social History Social History   Tobacco Use   Smoking status: Former    Types: Cigarettes   Smokeless tobacco: Never  Vaping Use   Vaping Use: Never used  Substance Use Topics   Alcohol use: Yes    Comment: occaisonally   Drug use: No     Allergies   Lisinopril and Metformin and related   Review of Systems Review of Systems  Respiratory:  Positive for shortness of breath.   Per HPI   Physical Exam Triage Vital Signs ED Triage Vitals  Enc Vitals Group     BP 09/09/22 1240 134/78     Pulse Rate 09/09/22 1240 80     Resp 09/09/22 1240 (!) 22     Temp 09/09/22 1240 98 F (36.7 C)     Temp Source 09/09/22 1240 Oral     SpO2 09/09/22 1240 90 %     Weight --      Height --      Head  Circumference --      Peak Flow --      Pain Score 09/09/22 1241 0     Pain Loc --      Pain Edu? --      Excl. in GC? --    No data found.  Updated Vital Signs BP 134/78 (BP Location: Left  Arm)   Pulse 80   Temp 98 F (36.7 C) (Oral)   Resp (!) 22   SpO2 93%   Visual Acuity Right Eye Distance:   Left Eye Distance:   Bilateral Distance:    Right Eye Near:   Left Eye Near:    Bilateral Near:     Physical Exam Vitals and nursing note reviewed.  Constitutional:      Appearance: She is not ill-appearing or toxic-appearing.  HENT:     Head: Normocephalic and atraumatic.     Right Ear: Hearing, tympanic membrane, ear canal and external ear normal.     Left Ear: Hearing, tympanic membrane, ear canal and external ear normal.     Nose: Nose normal.     Mouth/Throat:     Lips: Pink.     Mouth: Mucous membranes are moist. No injury.     Tongue: No lesions. Tongue does not deviate from midline.     Palate: No mass and lesions.     Pharynx: Oropharynx is clear. Uvula midline. No pharyngeal swelling, oropharyngeal exudate, posterior oropharyngeal erythema or uvula swelling.     Tonsils: No tonsillar exudate or tonsillar abscesses.  Eyes:     General: Lids are normal. Vision grossly intact. Gaze aligned appropriately.     Extraocular Movements: Extraocular movements intact.     Conjunctiva/sclera: Conjunctivae normal.  Cardiovascular:     Rate and Rhythm: Normal rate and regular rhythm.     Heart sounds: Normal heart sounds, S1 normal and S2 normal.  Pulmonary:     Effort: Pulmonary effort is normal. No respiratory distress.     Breath sounds: Normal air entry. Wheezing present.     Comments: Diffuse expiratory wheezing to the bilateral lung fields.  Musculoskeletal:     Cervical back: Neck supple.  Skin:    General: Skin is warm and dry.     Capillary Refill: Capillary refill takes less than 2 seconds.     Findings: No rash.  Neurological:     General: No focal deficit  present.     Mental Status: She is alert and oriented to person, place, and time. Mental status is at baseline.     Cranial Nerves: No dysarthria or facial asymmetry.  Psychiatric:        Mood and Affect: Mood normal.        Speech: Speech normal.        Behavior: Behavior normal.        Thought Content: Thought content normal.        Judgment: Judgment normal.      UC Treatments / Results  Labs (all labs ordered are listed, but only abnormal results are displayed) Labs Reviewed  SARS CORONAVIRUS 2 (TAT 6-24 HRS)    EKG   Radiology DG Chest 2 View  Result Date: 09/09/2022 CLINICAL DATA:  Shortness of breath EXAM: CHEST - 2 VIEW COMPARISON:  Chest x-ray August 19, 2016 FINDINGS: The cardiomediastinal silhouette is unchanged in contour. No focal pulmonary opacity. No pleural effusion or pneumothorax. The visualized upper abdomen is unremarkable. No acute osseous abnormality. IMPRESSION: No active cardiopulmonary disease. Electronically Signed   By: Jacob Moores M.D.   On: 09/09/2022 13:06    Procedures Procedures (including critical care time)  Medications Ordered in UC Medications  dexamethasone (DECADRON) injection 10 mg (10 mg Intramuscular Given 09/09/22 1310)  albuterol (VENTOLIN HFA) 108 (90 Base) MCG/ACT inhaler 2 puff (2 puffs Inhalation Given 09/09/22 1306)    Initial Impression /  Assessment and Plan / UC Course  I have reviewed the triage vital signs and the nursing notes.  Pertinent labs & imaging results that were available during my care of the patient were reviewed by me and considered in my medical decision making (see chart for details).   1. Acute bronchitis, shortness of breath Presentation consistent with acute bronchitis likely triggered by viral upper respiratory infection. Chest x-ray without cardiopulmonary abnormality. Patient given 2 puffs albuterol inhaler with slight relief of shortness of breath and improvement in clinic prior to discharge.  Dexamethasone 10mg  IM given to reduce inflammation over the next 1-2 days. May use albuterol scheduled every 4-6 hours for the next 24 hours, then as needed for cough, shortness of breath, and wheeze. Patient is a diabetic and has had two rounds of prednisone burst in the last 2-3 months, therefore will defer course of oral steroids. Oxygen saturation is at baseline at 94-95% on room air. Speaks in full sentences without difficulty. Non-toxic in appearance with hemodynamically stable vitals, therefore deferred transport to ED for further workup/evaluation.   Discussed physical exam and available lab work findings in clinic with patient.  Counseled patient regarding appropriate use of medications and potential side effects for all medications recommended or prescribed today. Discussed red flag signs and symptoms of worsening condition,when to call the PCP office, return to urgent care, and when to seek higher level of care in the emergency department. Patient verbalizes understanding and agreement with plan. All questions answered. Patient discharged in stable condition.  {The patient has been seen in Urgent Care in the last 3 years. :1  Final Clinical Impressions(s) / UC Diagnoses   Final diagnoses:  Acute bronchitis, unspecified organism  Shortness of breath     Discharge Instructions      You have bronchitis which is inflammation of the upper airways in your lungs due to a virus. The following medicines will help with your symptoms.   COVID testing is pending and will come back in the next 12-24 hours, staff will call you if this is positive.  - I gave you a shot of steroid that will keep working in your body over the next 2-3 days to help with shortness of breath and wheezing. - You may use albuterol inhaler 1 to 2 puffs every 4-6 hours as needed for cough, shortness of breath, and wheezing. - Take cough medicines as prescribed.  - Continue using over the counter medicines as needed as  directed. Plain mucinex (guaifenesin) over the counter may further help breakup mucus and help with symptoms.   If you develop any new or worsening symptoms or do not improve in the next 2 to 3 days, please return.  If your symptoms are severe, please go to the emergency room.  Follow-up with your primary care provider for further evaluation and management of your symptoms as well as ongoing wellness visits.  I hope you feel better!     ED Prescriptions     Medication Sig Dispense Auth. Provider   benzonatate (TESSALON) 100 MG capsule Take 1 capsule (100 mg total) by mouth every 8 (eight) hours. 21 capsule Reita May M, FNP   albuterol (VENTOLIN HFA) 108 (90 Base) MCG/ACT inhaler Inhale 1-2 puffs into the lungs every 6 (six) hours as needed for wheezing or shortness of breath. 8 g Carlisle Beers, FNP      PDMP not reviewed this encounter.   Carlisle Beers, [place] 09/09/22 1329

## 2022-09-10 LAB — SARS CORONAVIRUS 2 (TAT 6-24 HRS): SARS Coronavirus 2: NEGATIVE

## 2022-09-20 DIAGNOSIS — F411 Generalized anxiety disorder: Secondary | ICD-10-CM | POA: Diagnosis not present

## 2022-09-20 DIAGNOSIS — Z630 Problems in relationship with spouse or partner: Secondary | ICD-10-CM | POA: Diagnosis not present

## 2022-09-20 DIAGNOSIS — Z564 Discord with boss and workmates: Secondary | ICD-10-CM | POA: Diagnosis not present

## 2022-09-24 ENCOUNTER — Telehealth: Payer: Self-pay

## 2022-09-24 NOTE — Telephone Encounter (Signed)
Copied from CRM [id number]. Topic: Appointment Scheduling - Scheduling Inquiry for Clinic >> Sep 24, 2022 11:39 AM Franchot Heidelberg wrote: Reason for CRM: Pt just had to schedule her upcoming appt for med refills on Thursday. She wants to know if she is able to seen at [facility] for a med refill appt prior to her next appt in July with her PCP. Please advise

## 2022-09-26 ENCOUNTER — Ambulatory Visit: Payer: Medicaid Other | Admitting: Internal Medicine

## 2022-09-27 DIAGNOSIS — F411 Generalized anxiety disorder: Secondary | ICD-10-CM | POA: Diagnosis not present

## 2022-09-27 DIAGNOSIS — Z563 Stressful work schedule: Secondary | ICD-10-CM | POA: Diagnosis not present

## 2022-09-27 NOTE — Telephone Encounter (Signed)
Called patient to inform that Dr. Laural Benes could do a MyChart appt at either 0810 or 1330. She preferred to see Dr. Laural Benes in person.   Informed of the mobile unit option. She was pleased with that option.   Called patient back and she was scheduled apt for Saturday clinic 09/27/2022 at 1040.

## 2022-09-28 ENCOUNTER — Ambulatory Visit: Payer: Medicaid Other | Attending: Family | Admitting: Family

## 2022-09-28 VITALS — BP 133/92 | HR 88 | Resp 16 | Wt 287.8 lb

## 2022-09-28 DIAGNOSIS — R0683 Snoring: Secondary | ICD-10-CM

## 2022-09-28 DIAGNOSIS — G2581 Restless legs syndrome: Secondary | ICD-10-CM

## 2022-09-28 DIAGNOSIS — H539 Unspecified visual disturbance: Secondary | ICD-10-CM

## 2022-09-28 DIAGNOSIS — I159 Secondary hypertension, unspecified: Secondary | ICD-10-CM | POA: Diagnosis not present

## 2022-09-28 DIAGNOSIS — E1169 Type 2 diabetes mellitus with other specified complication: Secondary | ICD-10-CM

## 2022-09-28 DIAGNOSIS — G4761 Periodic limb movement disorder: Secondary | ICD-10-CM | POA: Diagnosis not present

## 2022-09-28 DIAGNOSIS — Z6841 Body Mass Index (BMI) 40.0 and over, adult: Secondary | ICD-10-CM | POA: Diagnosis not present

## 2022-09-28 DIAGNOSIS — Z7984 Long term (current) use of oral hypoglycemic drugs: Secondary | ICD-10-CM | POA: Diagnosis not present

## 2022-09-28 LAB — POCT GLYCOSYLATED HEMOGLOBIN (HGB A1C): HbA1c, POC (controlled diabetic range): 6.5 % (ref 0.0–7.0)

## 2022-09-28 MED ORDER — EMPAGLIFLOZIN 25 MG PO TABS: 25.0000 mg | ORAL_TABLET | Freq: Every day | ORAL | 1 refills | Status: DC

## 2022-09-28 MED ORDER — ALBUTEROL SULFATE HFA 108 (90 BASE) MCG/ACT IN AERS: 1.0000 | INHALATION_SPRAY | Freq: Four times a day (QID) | RESPIRATORY_TRACT | 0 refills | Status: AC | PRN

## 2022-09-28 NOTE — Patient Instructions (Signed)
-   You were referred to neurology fall your condition -You were referred to an eye specialist for you decreased in vision -Jardiance dose increased to 25 mg 1 tablet by mouth once a day.  Stay hydrated while taking this medication. -Increase physical activities and eat plant-based diet -Your inhalers were refilled.  Please take the medications as ordered -I referred you back to sleep study center for snoring evaluation. -Follow-up in 3 months or sooner if needed. -Take your blood pressure medication as ordered.

## 2022-09-28 NOTE — Progress Notes (Signed)
Patient has been counseled on age-appropriate routine health concerns for screening and prevention. These are reviewed and up-to-date. Referrals have been placed accordingly. Immunizations are up-to-date or declined.    Subjective:  Patient is a 47 year old female known to the practice who presents to the clinic this Saturday morning with complaints of increased limb movements while she is sleeping at night, snoring, change to her vision. She has a PMH history of hypertension and type 2 diabetes, well controlled at home.  Patient reports she had a sleep study in 2018 which showed periodic limb movements and she was diagnosed with restless leg syndrome at the same time.  She reports that her partner complains about being kicked at night while they are sleeping. At the same time patient reports feeling very tired in the morning as she is told that she snores at night. Patient's last eye exam was more than 6 years ago per her report.  Reports decrease to her vision, has to remove her eye glasses every time she is behind her desk working for the past 12 months.  Patient has a past medical history of type 2 diabetes which is well-controlled with hemoglobin A1c of 6.5%, taking Jardiance 10 mg by mouth once a day and endorsing drinking enough water to stay hydrated while taking this medication. She has history of hypertension with blood pressure of 133/92 this morning, patient states she just took her blood pressure medication before coming in and believes the medication has not kicked in yet. Reports more weight gain since her partner moved in with her. She reports no chest pain, shortness of breath, fever, chills, dizziness, or headaches today.  Patient believes she has gained weight since her last sleep study in 2018.  Review of Systems  Constitutional: Negative.   HENT: Negative.    Eyes:        Decreased vision  Respiratory:         Snoring at night.  Cardiovascular: Negative.   Gastrointestinal:  Negative.   Genitourinary: Negative.   Musculoskeletal: Negative.   Neurological:        Periodic limb movements while sleeping  Endo/Heme/Allergies: Negative.    Past Medical History:  Diagnosis Date   Depression    Diabetes mellitus without complication (HCC)    "pre-diabetic"   Eczema    GERD (gastroesophageal reflux disease)    Gestational hypertension    Hypertension     Past Surgical History:  Procedure Laterality Date   NO PAST SURGERIES      Family History  Problem Relation Age of Onset   Hypertension Mother    Stroke Mother    Diabetes Maternal Grandmother    Hypertension Maternal Grandmother    Cancer Maternal Aunt        breast cancer    Anesthesia problems Neg Hx     Social History Reviewed with no changes to be made today.   Outpatient Medications Prior to Visit  Medication Sig Dispense Refill   Blood Glucose Monitoring Suppl (ACCU-CHEK GUIDE ME) w/Device KIT UAD 1 kit 0   glucose blood (ACCU-CHEK GUIDE) test strip Check BS once a day 100 each 12   Lancets Misc. (ACCU-CHEK FASTCLIX LANCET) KIT UAD 100 kit 3   albuterol (VENTOLIN HFA) 108 (90 Base) MCG/ACT inhaler Inhale 1-2 puffs into the lungs every 6 (six) hours as needed for wheezing or shortness of breath. 8 g 0   amLODipine (NORVASC) 5 MG tablet Take 1 tablet (5 mg total) by mouth daily.  90 tablet 1   atorvastatin (LIPITOR) 20 MG tablet Take 1 tablet (20 mg total) by mouth daily. 90 tablet 1   benzonatate (TESSALON) 100 MG capsule Take 1 capsule (100 mg total) by mouth every 8 (eight) hours. (Patient not taking: Reported on 09/28/2022) 21 capsule 0   dextromethorphan-guaiFENesin (MUCINEX DM) 30-600 MG 12hr tablet Take 1 tablet by mouth 2 (two) times daily. (Patient not taking: Reported on 09/28/2022) 20 tablet 0   empagliflozin (JARDIANCE) 10 MG TABS tablet TAKE 1 TABLET BY MOUTH DAILY BEFORE BREAKFAST. 30 tablet 0   escitalopram (LEXAPRO) 20 MG tablet Take 20 mg by mouth daily.     hydrochlorothiazide  (HYDRODIURIL) 25 MG tablet Take 1 tablet (25 mg total) by mouth daily. 90 tablet 1   hydrOXYzine (ATARAX/VISTARIL) 25 MG tablet Take 12.5 mg by mouth 2 (two) times daily as needed.     levonorgestrel (MIRENA) 20 MCG/24HR IUD 1 each by Intrauterine route once.     meloxicam (MOBIC) 15 MG tablet Take 1 tablet (15 mg total) by mouth daily. 30 tablet 1   Spacer/Aero-Holding Chambers DEVI 1 Units by Does not apply route in the morning, at noon, and at bedtime. 1 Units 0   traZODone (DESYREL) 100 MG tablet Take 50-100 mg by mouth at bedtime. (Patient not taking: Reported on 03/27/2022)     triamcinolone (KENALOG) 0.025 % ointment Apply 1 Application topically 2 (two) times daily. 60 g 1   TRUEplus Lancets 28G MISC Use as directed 100 each 5   No facility-administered medications prior to visit.    Allergies  Allergen Reactions   Lisinopril Cough   Metformin And Related     Caused palpitations/heart pounding       Objective:    BP (!) 133/92 (BP Location: Right Arm, Patient Position: Sitting, Cuff Size: Large)   Pulse 88   Resp 16   Wt 287 lb 12.8 oz (130.5 kg)   SpO2 96%   BMI 52.64 kg/m  Wt Readings from Last 3 Encounters:  09/28/22 287 lb 12.8 oz (130.5 kg)  07/12/22 270 lb (122.5 kg)  06/06/22 271 lb 4.8 oz (123.1 kg)    Physical Exam Vitals reviewed.  Constitutional:      Appearance: Normal appearance. She is obese.  HENT:     Head: Normocephalic and atraumatic.  Eyes:     Extraocular Movements: Extraocular movements intact.     Conjunctiva/sclera: Conjunctivae normal.     Pupils: Pupils are equal, round, and reactive to light.  Cardiovascular:     Rate and Rhythm: Normal rate and regular rhythm.  Pulmonary:     Breath sounds: Normal breath sounds.  Abdominal:     General: Bowel sounds are normal.  Musculoskeletal:        General: Normal range of motion.  Skin:    General: Skin is warm and dry.  Neurological:     General: No focal deficit present.     Mental  Status: She is alert and oriented to person, place, and time.  Psychiatric:        Mood and Affect: Mood normal.        Behavior: Behavior normal.    Assessment & Plan:  Patient is a 47 year old female with complaints of periodic limb movements, snoring, decreasing vision, and feeling tired earlier in the day.  She has history of type 2 diabetes, well-controlled.  She has history of hypertension and takes medication every day as ordered.  She has not had eye exam  in more than 6 years, and has had sleep study in 2018.  PLAN:  1) Type 2 diabetes with morbid obesity:  -Increase physical activities and eat plant-based diet  -Jardiance dose increased to 25 mg by mouth once a day, patient reminded to stay hydrated while taking this medication.  Verbalized understanding.  -Monitor blood sugar at least once a day and bring the log/machine during next visit.  -Follow-up with primary care provider in 3 months.  -Schedule eye exam every 12 months.  2) secondary hypertension:  -Take blood pressure medication as ordered  -Increase physical activities and eat plant-based diet, eat low salt diet.  -Report new symptoms to the clinic or local ED  -Follow-up in 3 months with primary care provider.  3) Periodic Limb Movement:  -Patient referred to neurology  -Sleep study referral initiated.  4) Restless Leg Syndrome:  -Sleep study referral initiated.  -Patient to follow-up with neurology as indicated.  5) Change in Vision:  -Patient reminded the importance of eye exam every 12 months  -Referred to ophthalmology  -Reminded to report new or worsening symptoms to the clinic or local emergency room.  6) Snoring:  -Referral to sleep study initiated  -Reminded to report worsening symptoms to the clinic or local emergency room  -Follow-up with primary care provider in 90 days  -Increase physical activities and follow-up weight loss program provided by providers.   Patient has been counseled  extensively about nutrition and exercise as well as the importance of adherence with medications and regular follow-up. The patient was given clear instructions to go to ER or return to medical center if symptoms don't improve, worsen or new problems develop. The patient verbalized understanding.    Follow-up: No follow-ups on file.     Catarina Hartshorn, DNP, APRN, FNP-C  [facility] [place], [place] [phone]   09/28/2022, 11:35 AM

## 2022-09-30 ENCOUNTER — Encounter: Payer: Self-pay | Admitting: Neurology

## 2022-10-08 DIAGNOSIS — F411 Generalized anxiety disorder: Secondary | ICD-10-CM | POA: Diagnosis not present

## 2022-10-08 DIAGNOSIS — Z6379 Other stressful life events affecting family and household: Secondary | ICD-10-CM | POA: Diagnosis not present

## 2022-10-15 DIAGNOSIS — F411 Generalized anxiety disorder: Secondary | ICD-10-CM | POA: Diagnosis not present

## 2022-10-15 DIAGNOSIS — Z6379 Other stressful life events affecting family and household: Secondary | ICD-10-CM | POA: Diagnosis not present

## 2022-10-18 DIAGNOSIS — F411 Generalized anxiety disorder: Secondary | ICD-10-CM | POA: Diagnosis not present

## 2022-10-24 NOTE — Progress Notes (Signed)
Initial neurology clinic note  Reason for Evaluation: Consultation requested by Eleonore Chiquito, FNP for an opinion regarding kicking and punching in sleep. My final recommendations will be communicated back to the requesting physician by way of shared medical record or letter to requesting physician via [place] mail.  HPI: This is [name], a 47 y.o. left-handed female with a medical history of HTN, HLD, DM, GERD, and anxiety who presents to neurology clinic with the chief complaint of kicking and punching in sleep. The patient is alone today.  Patient is being told she has is kicking and punching in her sleep by her significant other. This has been going on at least 6 months. Patient is sleeping and is usually not aware of the symptoms. She has woken up to punching or kicking. She denies significant pain in her legs or restlessness wanting to move. She does remember her dreams as they tend to be vivid. She can wake up crying. She also endorses very loud snoring. Patient does not feel rested when waking in the morning.  Patient had a sleep study in 2018 that showed periodic limb movements. She does not think it showed sleep apnea. She never had any treatment of this.  Of note, patient has gained weight since 2018 sleep study.  Patient is unsure if another sleep study has been ordered.  Patient denies any reduction in sense of smell. She denies significant tremors. She denies freezing or imbalance and falls.  She mentions she was told she had neuropathy in the past and given gabapentin. She does not take it anymore because she does not have symptoms often anymore. She tolerated the medication well. She had numbness and tingling in her feet and hands. Since she controlled her DM better, her symptoms improved.  Patient has previously taken clonazepam for anxiety in the past. She did not have any side effects to this medication.  She does not report any constitutional symptoms like fever,  night sweats, anorexia or unintentional weight loss.  EtOH use: 1 beer per day  Restrictive diet? No Family history of neuropathy/myopathy/neurologic disease? Grandmother with Alzheimer's disease   MEDICATIONS:  Outpatient Encounter Medications as of 11/07/2022  Medication Sig   albuterol (VENTOLIN HFA) 108 (90 Base) MCG/ACT inhaler Inhale 1-2 puffs into the lungs every 6 (six) hours as needed for wheezing or shortness of breath.   amLODipine (NORVASC) 5 MG tablet Take 1 tablet (5 mg total) by mouth daily.   atorvastatin (LIPITOR) 20 MG tablet Take 1 tablet (20 mg total) by mouth daily.   Blood Glucose Monitoring Suppl (ACCU-CHEK GUIDE ME) w/Device KIT UAD   empagliflozin (JARDIANCE) 25 MG TABS tablet Take 1 tablet (25 mg total) by mouth daily before breakfast.   glucose blood (ACCU-CHEK GUIDE) test strip Check BS once a day   hydrochlorothiazide (HYDRODIURIL) 25 MG tablet Take 1 tablet (25 mg total) by mouth daily.   hydrOXYzine (ATARAX/VISTARIL) 25 MG tablet Take 12.5 mg by mouth 2 (two) times daily as needed.   Lancets Misc. (ACCU-CHEK FASTCLIX LANCET) KIT UAD   levonorgestrel (MIRENA) 20 MCG/24HR IUD 1 each by Intrauterine route once.   meloxicam (MOBIC) 15 MG tablet Take 1 tablet (15 mg total) by mouth daily.   Spacer/Aero-Holding Chambers DEVI 1 Units by Does not apply route in the morning, at noon, and at bedtime.   triamcinolone (KENALOG) 0.025 % ointment Apply 1 Application topically 2 (two) times daily.   TRUEplus Lancets 28G MISC Use as directed  escitalopram (LEXAPRO) 20 MG tablet Take 20 mg by mouth daily. (Patient not taking: Reported on 11/07/2022)   [DISCONTINUED] benzonatate (TESSALON) 100 MG capsule Take 1 capsule (100 mg total) by mouth every 8 (eight) hours. (Patient not taking: Reported on 09/28/2022)   [DISCONTINUED] dextromethorphan-guaiFENesin (MUCINEX DM) 30-600 MG 12hr tablet Take 1 tablet by mouth 2 (two) times daily. (Patient not taking: Reported on 09/28/2022)    [DISCONTINUED] traZODone (DESYREL) 100 MG tablet Take 50-100 mg by mouth at bedtime. (Patient not taking: Reported on 03/27/2022)   No facility-administered encounter medications on file as of 11/07/2022.    PAST MEDICAL HISTORY: Past Medical History:  Diagnosis Date   Depression    Diabetes mellitus without complication (HCC)    "pre-diabetic"   Eczema    GERD (gastroesophageal reflux disease)    Gestational hypertension    Hypertension     PAST SURGICAL HISTORY: Past Surgical History:  Procedure Laterality Date   NO PAST SURGERIES      ALLERGIES: Allergies  Allergen Reactions   Lisinopril Cough   Metformin And Related     Caused palpitations/heart pounding    FAMILY HISTORY: Family History  Problem Relation Age of Onset   Hypertension Mother    Stroke Mother    Diabetes Maternal Grandmother    Hypertension Maternal Grandmother    Cancer Maternal Aunt        breast cancer    Anesthesia problems Neg Hx     SOCIAL HISTORY: Social History   Tobacco Use   Smoking status: Former    Types: Cigarettes   Smokeless tobacco: Never  Vaping Use   Vaping Use: Never used  Substance Use Topics   Alcohol use: Yes    Comment: occaisonally   Drug use: No   Social History   Social History Narrative   Are you right handed or left handed? left   Are you currently employed ? 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 no   Who lives with you? With children   What type of home do you live in: 1 story or 2 story? 2 story           OBJECTIVE: PHYSICAL EXAM: BP 122/78   Pulse 86   Ht 5\' 2"  (1.575 m)   Wt 297 lb (134.7 kg)   SpO2 94%   BMI 54.32 kg/m   General: General appearance: Awake and alert. No distress. Cooperative with exam.  Skin: No obvious rash or jaundice. HEENT: Atraumatic. Anicteric. Lungs: Non-labored breathing on room air  Psych: Affect appropriate.  Neurological: Mental Status: Alert. Speech  fluent. No pseudobulbar affect Cranial Nerves: CNII: No RAPD. Visual fields grossly intact. CNIII, IV, VI: PERRL. No nystagmus. EOMI. CN V: Facial sensation intact bilaterally to fine touch. CN VII: Facial muscles symmetric and strong. No ptosis at rest CN VIII: Hearing grossly intact bilaterally. CN IX: No hypophonia. CN X: Palate elevates symmetrically. CN XI: Full strength shoulder shrug bilaterally. CN XII: Tongue protrusion full and midline. No atrophy or fasciculations. No significant dysarthria Motor: Tone is normal. No fasciculations in extremities. Strength is 5/5 in bilateral upper and lower extremities. Reflexes:  Right Left   Bicep 2+ 2+   Tricep 2+ 2+   BrRad 2+ 2+   Knee 2+ 2+   Ankle Trace Trace    Pathological Reflexes: Babinski: flexor response bilaterally Hoffman: absent bilaterally Troemner: absent bilaterally Sensation: Pinprick: Intact in all extremities Vibration: Intact  in all extremities Proprioception: Intact in bilateral great toes Coordination: Intact finger-to- nose-finger bilaterally. Romberg negative. Gait: Able to rise from chair with arms crossed unassisted. Normal, narrow-based gait.  Lab and Test Review: Internal labs: HbA1c (09/28/22): 6.5 CMP (03/27/22): unremarkable TSH (10/30/16): 1.110 Iron studies (10/10/17): normal, ferritin 201  Imaging/studies: Sleep study (01/20/17): IMPRESSIONS - No significant obstructive sleep apnea occurred during this study (AHI = 3.5/h). - No significant central sleep apnea occurred during this study (CAI = 0.0/h). - The patient had minimal or no oxygen desaturation during the study (Min O2 = 89.00%) - The patient snored with Loud snoring volume. - No cardiac abnormalities were noted during this study. - Mild periodic limb movements of sleep occurred during the study. Associated arousals were significant.   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 - Management for snoring might include decongestants, chin strap. Other options based on clinical judgment. - Sleep hygiene should be reviewed to assess factors that may improve sleep quality. - Weight management and regular exercise should be initiated or continued if appropriate.  ASSESSMENT: Whitney White is a 47 y.o. female who presents for evaluation of kicking and punching while sleeping. She has a relevant medical history of HTN, HLD, DM, GERD, and anxiety. Her neurological examination is essentially normal today. Available diagnostic data is significant for HbA1c of 6.5, ferritin in 2019 was 201, and sleep study in 2018 that did not show OSA but did show mild periodic limb movements of sleep. Given her prior sleep study, periodic limb movement disorder is certainly possible. Kicking and punching while dreaming could also be consistent with a REM sleep disorder. She does not have symptoms when awake that improvement with moving, so RLS is less likely. I will work up as follows  PLAN: -Blood work: iron studies, TSH, B12, IFE -Start melatonin 3 mg prior to bedtime -Sleep medicine referral -Will order a sleep study  -Return to clinic in 3 months  The impression above as well as the plan as outlined below were extensively discussed with the patient who voiced understanding. All questions were answered to their satisfaction.  When available, results of the above investigations and possible further recommendations will be communicated to the patient via telephone/MyChart. Patient to call office if not contacted after expected testing turnaround time.   Total time spent reviewing records, interview, history/exam, documentation, and coordination of care on day of encounter:  45 min   Thank you for allowing me to participate in patient's care.  If I can answer any  additional questions, I would be pleased to do so.  Jacquelyne Balint, MD   CC: Marcine Matar, MD [address] [place] [place] [address]  CC: Referring provider: Eleonore Chiquito, FNP [address] [place],  [place] [address]

## 2022-11-01 DIAGNOSIS — F411 Generalized anxiety disorder: Secondary | ICD-10-CM | POA: Diagnosis not present

## 2022-11-01 DIAGNOSIS — Z630 Problems in relationship with spouse or partner: Secondary | ICD-10-CM | POA: Diagnosis not present

## 2022-11-07 ENCOUNTER — Ambulatory Visit: Payer: Medicaid Other | Admitting: Neurology

## 2022-11-07 ENCOUNTER — Other Ambulatory Visit (INDEPENDENT_AMBULATORY_CARE_PROVIDER_SITE_OTHER): Payer: Medicaid Other

## 2022-11-07 ENCOUNTER — Encounter: Payer: Self-pay | Admitting: Neurology

## 2022-11-07 VITALS — BP 122/78 | HR 86 | Ht 62.0 in | Wt 297.0 lb

## 2022-11-07 DIAGNOSIS — R0683 Snoring: Secondary | ICD-10-CM

## 2022-11-07 DIAGNOSIS — G4761 Periodic limb movement disorder: Secondary | ICD-10-CM

## 2022-11-07 DIAGNOSIS — R259 Unspecified abnormal involuntary movements: Secondary | ICD-10-CM

## 2022-11-07 DIAGNOSIS — E1142 Type 2 diabetes mellitus with diabetic polyneuropathy: Secondary | ICD-10-CM

## 2022-11-07 NOTE — Patient Instructions (Signed)
I want investigate your symptoms further with the following: -Blood work today -Sleep study -Sleep medicine referral  Start melatonin 3 mg about 30 minutes prior to bed every night. This can be bought over the counter at any drug store or online.  I will be in touch when I have your results. I would like to see you back in clinic in about 3 months. Please let me know if you have any questions or concerns in the meantime.   The physicians and staff at [facility] are committed to providing excellent care. You may receive a survey requesting feedback about your experience at our office. We strive to receive "very good" responses to the survey questions. If you feel that your experience would prevent you from giving the office a "very good " response, please contact our office to try to remedy the situation. We may be reached at [phone]. Thank you for taking the time out of your busy day to complete the survey.  Jacquelyne Balint, MD [facility]

## 2022-11-08 ENCOUNTER — Other Ambulatory Visit: Payer: Self-pay | Admitting: Family Medicine

## 2022-11-08 ENCOUNTER — Other Ambulatory Visit: Payer: Self-pay | Admitting: Nurse Practitioner

## 2022-11-08 DIAGNOSIS — L209 Atopic dermatitis, unspecified: Secondary | ICD-10-CM

## 2022-11-08 LAB — IRON,TIBC AND FERRITIN PANEL
%SAT: 17 % (calc) (ref 16–45)
Ferritin: 101 ng/mL (ref 16–232)
Iron: 65 ug/dL (ref 40–190)
TIBC: 382 mcg/dL (calc) (ref 250–450)

## 2022-11-08 LAB — TSH: TSH: 2 u[IU]/mL (ref 0.35–5.50)

## 2022-11-08 LAB — VITAMIN B12: Vitamin B-12: 372 pg/mL (ref 211–911)

## 2022-11-08 NOTE — Telephone Encounter (Signed)
Requested medications are due for refill today.  yes  Requested medications are on the active medications list.  yes  Last refill. 03/27/2022 #60 1 rf  Future visit scheduled.   yes  Notes to clinic.  Refill not delegated.    Requested Prescriptions  Pending Prescriptions Disp Refills   triamcinolone (KENALOG) 0.025 % ointment [Pharmacy Med Name: TRIAMCINOLONE 0.025% OINT] 60 g 1    Sig: APPLY TO AFFECTED AREA TWICE A DAY     Not Delegated - Dermatology:  Corticosteroids Failed - 11/08/2022 11:36 AM      Failed - This refill cannot be delegated      Passed - Valid encounter within last 12 months    Recent Outpatient Visits           1 month ago Periodic limb movement   [place] [facility] [place], Jomarie Longs, FNP   7 months ago Type 2 diabetes mellitus with morbid obesity [facility])   [place] [facility] [place], [place] W, NP   1 year ago Type 2 diabetes mellitus with morbid obesity [facility])   [place] [facility] Jonah Hamblin B, MD   1 year ago Type 2 diabetes mellitus with morbid obesity [facility])   [place] [facility] Marcine Matar, MD   1 year ago Essential hypertension   [place] [facility], [place], NP       Future Appointments             In 1 month La[place] Benes, Binnie Rail, MD [facility]

## 2022-11-12 LAB — IMMUNOFIXATION ELECTROPHORESIS
IgG (Immunoglobin G), Serum: 1317 mg/dL (ref 600–1640)
IgM, Serum: 63 mg/dL (ref 50–300)
Immunoglobulin A: 220 mg/dL (ref 47–310)

## 2022-11-21 ENCOUNTER — Institutional Professional Consult (permissible substitution): Payer: Medicaid Other | Admitting: Nurse Practitioner

## 2022-11-29 DIAGNOSIS — F411 Generalized anxiety disorder: Secondary | ICD-10-CM | POA: Diagnosis not present

## 2022-11-29 DIAGNOSIS — Z5986 Financial insecurity: Secondary | ICD-10-CM | POA: Diagnosis not present

## 2022-11-29 DIAGNOSIS — Z630 Problems in relationship with spouse or partner: Secondary | ICD-10-CM | POA: Diagnosis not present

## 2022-11-30 ENCOUNTER — Other Ambulatory Visit: Payer: Self-pay | Admitting: Nurse Practitioner

## 2022-11-30 DIAGNOSIS — I10 Essential (primary) hypertension: Secondary | ICD-10-CM

## 2022-12-02 DIAGNOSIS — F332 Major depressive disorder, recurrent severe without psychotic features: Secondary | ICD-10-CM | POA: Diagnosis not present

## 2022-12-02 DIAGNOSIS — F411 Generalized anxiety disorder: Secondary | ICD-10-CM | POA: Diagnosis not present

## 2022-12-02 NOTE — Telephone Encounter (Signed)
Requested by interface surescripts. Future visit in 4 weeks.  Requested Prescriptions  Pending Prescriptions Disp Refills   amLODipine (NORVASC) 5 MG tablet [Pharmacy Med Name: AMLODIPINE BESYLATE 5 MG TAB] 90 tablet 0    Sig: TAKE 1 TABLET (5 MG TOTAL) BY MOUTH DAILY.     Cardiovascular: Calcium Channel Blockers 2 Passed - 11/30/2022  1:51 PM      Passed - Last BP in normal range    BP Readings from Last 1 Encounters:  11/07/22 122/78         Passed - Last Heart Rate in normal range    Pulse Readings from Last 1 Encounters:  11/07/22 86         Passed - Valid encounter within last 6 months    Recent Outpatient Visits           2 months ago Periodic limb movement   [facility] [place], Jomarie Longs, FNP   8 months ago Type 2 diabetes mellitus with morbid obesity [facility])   [place] [facility] [place], [place] W, NP   1 year ago Type 2 diabetes mellitus with morbid obesity [facility])   [place] [facility] Jonah Wain B, MD   1 year ago Type 2 diabetes mellitus with morbid obesity [facility])   [place] [facility] Marcine Matar, MD   2 years ago Essential hypertension   [place] [facility], [place], NP       Future Appointments             In 4 weeks Marcine Matar, MD [facility] [place] [facility]

## 2022-12-04 ENCOUNTER — Encounter: Payer: Self-pay | Admitting: Nurse Practitioner

## 2022-12-04 ENCOUNTER — Ambulatory Visit (INDEPENDENT_AMBULATORY_CARE_PROVIDER_SITE_OTHER): Payer: Medicaid Other | Admitting: Nurse Practitioner

## 2022-12-04 VITALS — BP 126/80 | HR 85 | Ht 62.0 in | Wt 295.2 lb

## 2022-12-04 DIAGNOSIS — G2581 Restless legs syndrome: Secondary | ICD-10-CM | POA: Diagnosis not present

## 2022-12-04 DIAGNOSIS — G4719 Other hypersomnia: Secondary | ICD-10-CM

## 2022-12-04 DIAGNOSIS — Z630 Problems in relationship with spouse or partner: Secondary | ICD-10-CM | POA: Diagnosis not present

## 2022-12-04 DIAGNOSIS — Z5986 Financial insecurity: Secondary | ICD-10-CM | POA: Diagnosis not present

## 2022-12-04 DIAGNOSIS — F411 Generalized anxiety disorder: Secondary | ICD-10-CM | POA: Diagnosis not present

## 2022-12-04 DIAGNOSIS — Z6841 Body Mass Index (BMI) 40.0 and over, adult: Secondary | ICD-10-CM

## 2022-12-04 NOTE — Assessment & Plan Note (Addendum)
Possibly due to untreated OSA; although, she appears to have a history of this without any significant sleep disordered breathing on sleep study from 2018. Not currently treated. See above plan. Will await sleep study results. May consider pharmacological therapy in the future.

## 2022-12-04 NOTE — Assessment & Plan Note (Signed)
She has snoring, excessive daytime sleepiness, restless sleep. BMI 53.9. Epworth 19. Given this,  I am concerned she could have sleep disordered breathing with obstructive sleep apnea. She will need sleep study for further evaluation.    - discussed how weight can impact sleep and risk for sleep disordered breathing - discussed options to assist with weight loss: combination of diet modification, cardiovascular and strength training exercises   - had an extensive discussion regarding the adverse health consequences related to untreated sleep disordered breathing - specifically discussed the risks for hypertension, coronary artery disease, cardiac dysrhythmias, cerebrovascular disease, and diabetes - lifestyle modification discussed   - discussed how sleep disruption can increase risk of accidents, particularly when driving - safe driving practices were discussed  Patient Instructions  Given your symptoms, I am concerned that you may have sleep disordered breathing with sleep apnea. You will need a sleep study for further evaluation which you already have scheduled on 7/18   We discussed how untreated sleep apnea puts an individual at risk for cardiac arrhthymias, pulm HTN, DM, stroke and increases their risk for daytime accidents. We also briefly reviewed treatment options including weight loss, side sleeping position, oral appliance, CPAP therapy or referral to ENT for possible surgical options  Use caution when driving and pull over if you become sleepy.  Follow up in 5-6 weeks with Whitney Kitty Cadavid,NP to go over sleep study results, or sooner, if needed

## 2022-12-04 NOTE — Progress Notes (Signed)
@Patient  ID: Whitney White, female    DOB: [birth year], 47 y.o.   MRN: [id number]  Chief Complaint  Patient presents with   Consult    Pt has been moving in her sleep, daytime sleepiness, pt has had a sleep study prior.     Referring provider: Marcine Matar, MD  HPI: 47 year old female, former smoker referred for sleep consult. Past medical history significant for HTN, GERD, depression, diastolic dysfunction, RLS, anxiety, obesity, prediabetes, HLD.  TEST/EVENTS:  01/20/2017 NPSG: AHI 3.5/h, SpO2 low 89%; weight 255 lb  12/04/2022: Today - sleep consult Patient presents today for sleep consult, referred by Dr. Laural Benes. She has been having trouble with her sleep for many years. She had a sleep study in 2018 which some periodic limb movement but no evidence of significant sleep disordered breathing. She feels like her symptoms have progressively worsened. She has been told by her bed partner that she constantly moves during the night. She tosses and turns often. She feels her sleep is restless. She wakes feeling tired. She has daytime sleepiness. She has been told she snores. She does talk in her sleep. She tells me she will have full conversations. She has slept walked years ago but nothing of recent. No witnessed apneas, drowsy driving, morning headaches, sleep paralysis. No history of narcolepsy or symptoms of cataplexy. She goes to bed around 9-11 pm. Falls asleep within 20 minutes. Takes 7-8 times a night. Gets up around 5:40-5:45 am. She currently takes 3 mg of melatonin nightly. She was on trazodone, prescribed by her psychiatrist, in the past but hasn't taken this in quite some time. 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 She's gained 40 lb since her last sleep study. No oxygen use.  She has a history of HTN, angina, heart rhythm problems, DM. No history of stroke. She is followed by cardiology. She is a former smoker; quit 2015. Drinks alcohol socially. No excessive caffeine intake. She  lives with her children. 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 Family history of allergies and cancer.   Epworth 19  Allergies  Allergen Reactions   Lisinopril Cough   Metformin And Related     Caused palpitations/heart pounding    Immunization History  Administered Date(s) Administered   HPV 9-valent 01/29/2018, 08/03/2018, 10/05/2018, 01/05/2019   Influenza,inj,Quad PF,6+ Mos 03/02/2014, 03/10/2017, 08/03/2018, 03/27/2022   Influenza-Unspecified 04/03/2021   PFIZER(Purple Top)SARS-COV-2 Vaccination 11/15/2019, 12/08/2019   PPD Test 02/23/2021   Pneumococcal Polysaccharide-23 01/02/2018   Tdap 09/19/2011, 03/27/2022    Past Medical History:  Diagnosis Date   Depression    Diabetes mellitus without complication (HCC)    "pre-diabetic"   Eczema    GERD (gastroesophageal reflux disease)    Gestational hypertension    Hypertension     Tobacco History: Social History   Tobacco Use  Smoking Status Former   Types: Cigarettes  Smokeless Tobacco Never   Counseling given: Not Answered   Outpatient Medications Prior to Visit  Medication Sig Dispense Refill   albuterol (VENTOLIN HFA) 108 (90 Base) MCG/ACT inhaler Inhale 1-2 puffs into the lungs every 6 (six) hours as needed for wheezing or shortness of breath. 8 g 0   amLODipine (NORVASC) 5 MG tablet TAKE 1 TABLET (5 MG TOTAL) BY MOUTH DAILY. 90 tablet 0   atorvastatin (LIPITOR) 20 MG tablet Take 1 tablet (20 mg total) by mouth daily. 90 tablet 1   Blood Glucose Monitoring Suppl (ACCU-CHEK GUIDE ME) w/Device KIT UAD 1  kit 0   empagliflozin (JARDIANCE) 25 MG TABS tablet Take 1 tablet (25 mg total) by mouth daily before breakfast. 90 tablet 1   escitalopram (LEXAPRO) 20 MG tablet Take 20 mg by mouth daily.     glucose blood (ACCU-CHEK GUIDE) test strip Check BS once a day 100 each 12   hydrochlorothiazide (HYDRODIURIL) 25 MG tablet Take 1 tablet (25 mg total) by mouth daily. 90 tablet 1   hydrOXYzine  (ATARAX/VISTARIL) 25 MG tablet Take 12.5 mg by mouth 2 (two) times daily as needed.     Lancets Misc. (ACCU-CHEK FASTCLIX LANCET) KIT UAD 100 kit 3   levonorgestrel (MIRENA) 20 MCG/24HR IUD 1 each by Intrauterine route once.     meloxicam (MOBIC) 15 MG tablet Take 1 tablet (15 mg total) by mouth daily. 30 tablet 1   Spacer/Aero-Holding Chambers DEVI 1 Units by Does not apply route in the morning, at noon, and at bedtime. 1 Units 0   triamcinolone (KENALOG) 0.025 % ointment APPLY TO AFFECTED AREA TWICE A DAY 60 g 0   TRUEplus Lancets 28G MISC Use as directed 100 each 5   No facility-administered medications prior to visit.     Review of Systems:   Constitutional: No night sweats, fevers, chills, or lassitude. +fatigue, weight gain HEENT: No headaches, difficulty swallowing, tooth/dental problems, or sore throat. No sneezing, itching, ear ache. +occasional nasal congestion CV:  No chest pain, orthopnea, PND, swelling in lower extremities, anasarca, dizziness, palpitations, syncope Resp: +snoring; shortness of breath with exertion (baseline). No excess mucus or change in color of mucus. No productive or non-productive. No hemoptysis. No wheezing.  No chest wall deformity GI:  +heartburn, indigestion. No abdominal pain, nausea, vomiting, diarrhea, change in bowel habits, loss of appetite, bloody stools.  GU: No dysuria, change in color of urine, urgency or frequency.  Skin: No rash, lesions, ulcerations MSK:  No joint pain or swelling.  Neuro: No dizziness or lightheadedness.  Psych: +stable depression, anxiety. Mood stable. +sleep disturbance    Physical Exam:  BP 126/80   Pulse 85   Ht 5\' 2"  (1.575 m)   Wt 295 lb 3.2 oz (133.9 kg)   SpO2 95%   BMI 53.99 kg/m   GEN: Pleasant, interactive, well-kempt; morbidly obese; in no acute distress. HEENT:  Normocephalic and atraumatic. PERRLA. Sclera white. Nasal turbinates pink, moist and patent bilaterally. No rhinorrhea present. Oropharynx  pink and moist, without exudate or edema. No lesions, ulcerations, or postnasal drip. Mallampati III/IV NECK:  Supple w/ fair ROM. No JVD present. Normal carotid impulses w/o bruits. Thyroid symmetrical with no goiter or nodules palpated. No lymphadenopathy.   CV: RRR, no m/r/g, no peripheral edema. Pulses intact, +2 bilaterally. No cyanosis, pallor or clubbing. PULMONARY:  Unlabored, regular breathing. Clear bilaterally A&P w/o wheezes/rales/rhonchi. No accessory muscle use.  GI: BS present and normoactive. Soft, non-tender to palpation. No organomegaly or masses detected.  MSK: No erythema, warmth or tenderness. Cap refil <2 sec all extrem. No deformities or joint swelling noted.  Neuro: A/Ox3. No focal deficits noted.   Skin: Warm, no lesions or rashe Psych: Normal affect and behavior. Judgement and thought content appropriate.     Lab Results:  CBC    Component Value Date/Time   WBC 8.4 12/12/2020 1616   WBC 8.1 08/24/2016 0042   RBC 5.53 (H) 12/12/2020 1616   RBC 5.06 08/24/2016 0042   HGB 13.3 12/12/2020 1616   HCT 43.0 12/12/2020 1616   PLT 429 12/12/2020 1616  MCV 78 (L) 12/12/2020 1616   MCH 24.1 (L) 12/12/2020 1616   MCH 24.1 (L) 08/24/2016 0042   MCHC 30.9 (L) 12/12/2020 1616   MCHC 31.4 08/24/2016 0042   RDW 13.9 12/12/2020 1616   LYMPHSABS 2.0 08/24/2016 0042   MONOABS 0.4 08/24/2016 0042   EOSABS 0.1 08/24/2016 0042   BASOSABS 0.0 08/24/2016 0042    BMET    Component Value Date/Time   NA 141 03/27/2022 1015   K 3.8 03/27/2022 1015   CL 96 03/27/2022 1015   CO2 28 03/27/2022 1015   GLUCOSE 96 03/27/2022 1015   GLUCOSE 125 (H) 08/24/2016 0042   BUN 9 03/27/2022 1015   CREATININE 0.78 03/27/2022 1015   CALCIUM 9.8 03/27/2022 1015   GFRNONAA 86 12/27/2019 1010   GFRAA 99 12/27/2019 1010    BNP    Component Value Date/Time   BNP 11.9 10/08/2017 0934     Imaging:  No results found.       Latest Ref Rng & Units 10/23/2017    9:41 AM  PFT  Results  FVC-Pre L 1.75   FVC-Predicted Pre % 61   FVC-Post L 1.78   FVC-Predicted Post % 62   Pre FEV1/FVC % % 89   Post FEV1/FCV % % 88   FEV1-Pre L 1.56   FEV1-Predicted Pre % 65   FEV1-Post L 1.57   DLCO uncorrected ml/min/mmHg 15.09   DLCO UNC% % 69   DLCO corrected ml/min/mmHg 15.70   DLCO COR %Predicted % 72   DLVA Predicted % 133   TLC L 3.67   TLC % Predicted % 77   RV % Predicted % 103     No results found for: "NITRICOXIDE"      Assessment & Plan:   Excessive daytime sleepiness She has snoring, excessive daytime sleepiness, restless sleep. BMI 53.9. Epworth 19. Given this,  I am concerned she could have sleep disordered breathing with obstructive sleep apnea. She will need sleep study for further evaluation.    - discussed how weight can impact sleep and risk for sleep disordered breathing - discussed options to assist with weight loss: combination of diet modification, cardiovascular and strength training exercises   - had an extensive discussion regarding the adverse health consequences related to untreated sleep disordered breathing - specifically discussed the risks for hypertension, coronary artery disease, cardiac dysrhythmias, cerebrovascular disease, and diabetes - lifestyle modification discussed   - discussed how sleep disruption can increase risk of accidents, particularly when driving - safe driving practices were discussed  Patient Instructions  Given your symptoms, I am concerned that you may have sleep disordered breathing with sleep apnea. You will need a sleep study for further evaluation which you already have scheduled on 7/18   We discussed how untreated sleep apnea puts an individual at risk for cardiac arrhthymias, pulm HTN, DM, stroke and increases their risk for daytime accidents. We also briefly reviewed treatment options including weight loss, side sleeping position, oral appliance, CPAP therapy or referral to ENT for possible surgical  options  Use caution when driving and pull over if you become sleepy.  Follow up in 5-6 weeks with Florentina Addison Jerimy Johanson,NP to go over sleep study results, or sooner, if needed     RLS (restless legs syndrome) Possibly due to untreated OSA; although, she appears to have a history of this without any significant sleep disordered breathing on sleep study from 2018. Not currently treated. See above plan. Will await sleep study results. May  consider pharmacological therapy in the future.   Class 3 severe obesity with body mass index (BMI) of 50.0 to 59.9 in adult (HCC) BMI 53.9. Healthy weight loss encouraged.   I spent 45 minutes of dedicated to the care of this patient on the date of this encounter to include pre-visit review of records, face-to-face time with the patient discussing conditions above, post visit ordering of testing, clinical documentation with the electronic health record, making appropriate referrals as documented, and communicating necessary findings to members of the patients care team.  Noemi Chapel, NP 12/04/2022  Pt aware and understands NP's role.

## 2022-12-04 NOTE — Assessment & Plan Note (Signed)
BMI 53.9. Healthy weight loss encouraged.

## 2022-12-04 NOTE — Patient Instructions (Signed)
Given your symptoms, I am concerned that you may have sleep disordered breathing with sleep apnea. You will need a sleep study for further evaluation which you already have scheduled on 7/18   We discussed how untreated sleep apnea puts an individual at risk for cardiac arrhthymias, pulm HTN, DM, stroke and increases their risk for daytime accidents. We also briefly reviewed treatment options including weight loss, side sleeping position, oral appliance, CPAP therapy or referral to ENT for possible surgical options  Use caution when driving and pull over if you become sleepy.  Follow up in 5-6 weeks with Katie Latravis Grine,NP to go over sleep study results, or sooner, if needed

## 2022-12-13 DIAGNOSIS — F411 Generalized anxiety disorder: Secondary | ICD-10-CM | POA: Diagnosis not present

## 2022-12-18 DIAGNOSIS — F411 Generalized anxiety disorder: Secondary | ICD-10-CM | POA: Diagnosis not present

## 2022-12-18 DIAGNOSIS — Z5986 Financial insecurity: Secondary | ICD-10-CM | POA: Diagnosis not present

## 2022-12-19 ENCOUNTER — Ambulatory Visit (HOSPITAL_BASED_OUTPATIENT_CLINIC_OR_DEPARTMENT_OTHER): Payer: Medicaid Other | Attending: Family | Admitting: Internal Medicine

## 2022-12-19 DIAGNOSIS — G4733 Obstructive sleep apnea (adult) (pediatric): Secondary | ICD-10-CM | POA: Diagnosis not present

## 2022-12-19 DIAGNOSIS — R0683 Snoring: Secondary | ICD-10-CM | POA: Diagnosis present

## 2022-12-19 DIAGNOSIS — G4736 Sleep related hypoventilation in conditions classified elsewhere: Secondary | ICD-10-CM | POA: Insufficient documentation

## 2022-12-19 DIAGNOSIS — G4761 Periodic limb movement disorder: Secondary | ICD-10-CM | POA: Diagnosis present

## 2022-12-19 DIAGNOSIS — G2581 Restless legs syndrome: Secondary | ICD-10-CM

## 2022-12-22 ENCOUNTER — Encounter: Payer: Self-pay | Admitting: Internal Medicine

## 2022-12-22 ENCOUNTER — Telehealth: Payer: Self-pay | Admitting: Internal Medicine

## 2022-12-22 ENCOUNTER — Other Ambulatory Visit: Payer: Self-pay | Admitting: Internal Medicine

## 2022-12-22 DIAGNOSIS — F411 Generalized anxiety disorder: Secondary | ICD-10-CM

## 2022-12-22 DIAGNOSIS — G4733 Obstructive sleep apnea (adult) (pediatric): Secondary | ICD-10-CM

## 2022-12-22 DIAGNOSIS — G4761 Periodic limb movement disorder: Secondary | ICD-10-CM

## 2022-12-22 MED ORDER — ESCITALOPRAM OXALATE 20 MG PO TABS
20.0000 mg | ORAL_TABLET | Freq: Every day | ORAL | 0 refills | Status: DC
Start: 2022-12-22 — End: 2023-02-13
  Filled 2022-12-22: qty 30, 30d supply, fill #0

## 2022-12-22 NOTE — Procedures (Signed)
   Patient Name: Whitney White, Whitney White Date: 12/19/2022 Gender: Female D.O.B: 04-Jan-1976 Age (years): 47 Referring Provider: Eleonore Chiquito NP Height (inches): 62 Interpreting Physician: Jetty Duhamel MD, ABSM Weight (lbs): 295 RPSGT: Lowry Ram BMI: 54 MRN: [id number] Neck Size: 21.00  CLINICAL INFORMATION Sleep Study Type: NPSG Indication for sleep study: Daytime Fatigue, Hypertension, Morbid Obesity, Obesity, Snoring, Witnesses Apnea / Gasping During Sleep Epworth Sleepiness Score: 15  Most recent polysomnogram dated 01/20/2017 revealed an AHI of 3.5/h and RDI of 3.7/h.  SLEEP STUDY TECHNIQUE As per the AASM Manual for the Scoring of Sleep and Associated Events v2.3 (April 2016) with a hypopnea requiring 4% desaturations.  The channels recorded and monitored were frontal, central and occipital EEG, electrooculogram (EOG), submentalis EMG (chin), nasal and oral airflow, thoracic and abdominal wall motion, anterior tibialis EMG, snore microphone, electrocardiogram, and pulse oximetry.  MEDICATIONS Medications self-administered by patient taken the night of the study : BUSPIRONE HCL, MELATONIN  SLEEP ARCHITECTURE The study was initiated at 10:20:48 PM and ended at 4:52:57 AM.  Sleep onset time was 5.6 minutes and the sleep efficiency was 75.5%. The total sleep time was 296 minutes.  Stage REM latency was 353.0 minutes.  The patient spent 5.6% of the night in stage N1 sleep, 89.9% in stage N2 sleep, 0.0% in stage N3 and 4.6% in REM.  Alpha intrusion was absent.  Supine sleep was 17.57%.  RESPIRATORY PARAMETERS The overall apnea/hypopnea index (AHI) was 153.6 per hour. There were 380 total apneas, including 380 obstructive, 0 central and 0 mixed apneas. There were 378 hypopneas and 8 RERAs.  The AHI during Stage REM sleep was 142.2 per hour.  AHI while supine was 148.8 per hour.  The mean oxygen saturation was 88.5%. The minimum SpO2 during sleep was  74.0%.  moderate snoring was noted during this study.  CARDIAC DATA The 2 lead EKG demonstrated sinus rhythm. The mean heart rate was 71.6 beats per minute. Other EKG findings include: None.  LEG MOVEMENT DATA The total PLMS were 0 with a resulting PLMS index of 0.0. Associated arousal with leg movement index was 1.2 .  IMPRESSIONS - Severe obstructive sleep apnea occurred during this study (AHI = 153.6/h). - Moderate oxygen desaturation was noted during this study (Min O2 = 74.0%, Mean 88.5%). Time withO2 saturation 88% or less was 124 minutes. - The patient snored with moderate snoring volume. - No cardiac abnormalities were noted during this study. - Clinically significant periodic limb movements did not occur during sleep. No significant associated arousals.  DIAGNOSIS - Obstructive Sleep Apnea (G47.33) - Nocturnal Hypoxemia (G47.36)  RECOMMENDATIONS - Suggest CPAP titration sleep study or autopap. Other options would be based on clinical judgment. - Be careful with alcohol, sedatives and other CNS depressants that may worsen sleep apnea and disrupt normal sleep architecture. - Sleep hygiene should be reviewed to assess factors that may improve sleep quality. - Weight management and regular exercise should be initiated or continued if appropriate.  [Electronically signed] 12/22/2022 12:54 PM  Jetty Duhamel MD, ABSM Diplomate, American Board of Sleep Medicine NPI: [id number]                          Jetty Duhamel Diplomate, American Board of Sleep Medicine  ELECTRONICALLY SIGNED ON:  12/22/2022, 12:47 PM [facility] PH: [phone]   FX: [phone] ACCREDITED BY THE AMERICAN ACADEMY OF SLEEP MEDICINE

## 2022-12-23 ENCOUNTER — Other Ambulatory Visit: Payer: Self-pay | Admitting: Nurse Practitioner

## 2022-12-23 ENCOUNTER — Other Ambulatory Visit (HOSPITAL_COMMUNITY): Payer: Self-pay

## 2022-12-23 ENCOUNTER — Other Ambulatory Visit: Payer: Self-pay | Admitting: Internal Medicine

## 2022-12-23 DIAGNOSIS — I10 Essential (primary) hypertension: Secondary | ICD-10-CM

## 2022-12-23 MED ORDER — HYDROCHLOROTHIAZIDE 25 MG PO TABS: 25.0000 mg | ORAL_TABLET | Freq: Every day | ORAL | 0 refills | Status: DC

## 2022-12-23 NOTE — Telephone Encounter (Signed)
Called & spoke to the patient. Verified name & DOB. Informed of results. Informed to expect a phone call from [facility] to schedule her an appointment for the second part of her study. Patient aware to call back after 3 weeks if  no appointment was made. No further questions at this time.

## 2022-12-24 ENCOUNTER — Ambulatory Visit: Payer: Medicaid Other

## 2022-12-24 ENCOUNTER — Other Ambulatory Visit: Payer: Self-pay

## 2022-12-24 ENCOUNTER — Ambulatory Visit
Admission: RE | Admit: 2022-12-24 | Discharge: 2022-12-24 | Disposition: A | Discharge status: Home / Self Care | Payer: Medicaid Other | Source: Ambulatory Visit | Attending: Family Medicine | Admitting: Family Medicine

## 2022-12-24 VITALS — BP 146/87 | HR 69 | Temp 98.1°F | Resp 18

## 2022-12-24 DIAGNOSIS — M25511 Pain in right shoulder: Secondary | ICD-10-CM | POA: Diagnosis not present

## 2022-12-24 DIAGNOSIS — S46911A Strain of unspecified muscle, fascia and tendon at shoulder and upper arm level, right arm, initial encounter: Secondary | ICD-10-CM

## 2022-12-24 MED ORDER — CYCLOBENZAPRINE HCL 10 MG PO TABS: 10.0000 mg | ORAL_TABLET | Freq: Two times a day (BID) | ORAL | 0 refills | Status: DC | PRN

## 2022-12-24 MED ORDER — PREDNISONE 20 MG PO TABS: 40.0000 mg | ORAL_TABLET | Freq: Every day | ORAL | 0 refills | Status: DC

## 2022-12-24 NOTE — Telephone Encounter (Signed)
Unable to refill per protocol, Rx request was refilled 12/23/22 for 30 days.  Requested Prescriptions  Pending Prescriptions Disp Refills   hydrochlorothiazide (HYDRODIURIL) 25 MG tablet [Pharmacy Med Name: HYDROCHLOROTHIAZIDE 25 MG TAB] 90 tablet 1    Sig: TAKE 1 TABLET (25 MG TOTAL) BY MOUTH DAILY.     Cardiovascular: Diuretics - Thiazide Failed - 12/23/2022  7:24 AM      Failed - Cr in normal range and within 180 days    Creatinine, Ser  Date Value Ref Range Status  03/27/2022 0.78 0.57 - 1.00 mg/dL Final         Failed - K in normal range and within 180 days    Potassium  Date Value Ref Range Status  03/27/2022 3.8 3.5 - 5.2 mmol/L Final         Failed - Na in normal range and within 180 days    Sodium  Date Value Ref Range Status  03/27/2022 141 134 - 144 mmol/L Final         Passed - Last BP in normal range    BP Readings from Last 1 Encounters:  12/04/22 126/80         Passed - Valid encounter within last 6 months    Recent Outpatient Visits           2 months ago Periodic limb movement   [place] [facility] [place], Jomarie Longs, FNP   9 months ago Type 2 diabetes mellitus with morbid obesity [facility])   [place] [facility] [place], [place] W, NP   1 year ago Type 2 diabetes mellitus with morbid obesity [facility])   [place] [facility] Jonah Macdonell B, MD   2 years ago Type 2 diabetes mellitus with morbid obesity [facility])   [place] [facility] Marcine Matar, MD   2 years ago Essential hypertension   [place] [facility], [place], NP       Future Appointments             Today   Urgent Care at [facility])   In 6 days Marcine Matar, MD [facility]

## 2022-12-24 NOTE — ED Provider Notes (Signed)
[name]    CSN: [id number] Arrival date & time: 12/24/22  1223      History   Chief Complaint Chief Complaint  Patient presents with   Shoulder Pain    My shoulder is in exceutiating pain. It comes and goes. I cannot lay on that side to sleep. It has been going on for a few days - Entered by patient    HPI Whitney White is a 47 y.o. female.   HPI Patient presents with right shoulder pain x 6 days. She denies any specific injury.  Reports as the week has progressed her symptoms have worsened.  She has been performing range of motion to try to alleviate the pain.  Reports no previous problems with right shoulder pain.  Past Medical History:  Diagnosis Date   Depression    Diabetes mellitus without complication (HCC)    "pre-diabetic"   Eczema    GERD (gastroesophageal reflux disease)    Gestational hypertension    Hypertension     Patient Active Problem List   Diagnosis Date Noted   Excessive daytime sleepiness 12/04/2022   Screening for cervical cancer 06/06/2022   Screening examination for STD (sexually transmitted disease) 06/06/2022   Vaginal bleeding 06/06/2022   Periodic limb movement 03/10/2017   RLS (restless legs syndrome) 01/26/2017   Mild diastolic dysfunction 12/06/2016   Prediabetes 11/26/2016   Hyperlipidemia 11/26/2016   Anxiety state, unspecified 12/22/2013   Class 3 severe obesity with body mass index (BMI) of 50.0 to 59.9 in adult [facility]) 12/22/2013   Chest pain 12/17/2013   Depression    Hypertension    Eczema    Gestational hypertension    GERD (gastroesophageal reflux disease)     Past Surgical History:  Procedure Laterality Date   NO PAST SURGERIES      OB History     Gravida  4   Para  4   Term  4   Preterm  0   AB  0   Living  4      SAB  0   IAB  0   Ectopic  0   Multiple  0   Live Births  1            Home Medications    Prior to Admission medications   Medication Sig Start Date End  Date Taking? Authorizing Provider  cyclobenzaprine (FLEXERIL) 10 MG tablet Take 1 tablet (10 mg total) by mouth 2 (two) times daily as needed for muscle spasms. 12/24/22  Yes Bing Neighbors, NP  predniSONE (DELTASONE) 20 MG tablet Take 2 tablets (40 mg total) by mouth daily with breakfast. 12/24/22  Yes Bing Neighbors, NP  albuterol (VENTOLIN HFA) 108 (90 Base) MCG/ACT inhaler Inhale 1-2 puffs into the lungs every 6 (six) hours as needed for wheezing or shortness of breath. 09/28/22   Eleonore Chiquito, FNP  amLODipine (NORVASC) 5 MG tablet TAKE 1 TABLET (5 MG TOTAL) BY MOUTH DAILY. 12/02/22   Claiborne Rigg, NP  atorvastatin (LIPITOR) 20 MG tablet Take 1 tablet (20 mg total) by mouth daily. 03/27/22   Claiborne Rigg, NP  Blood Glucose Monitoring Suppl (ACCU-CHEK GUIDE ME) w/Device KIT UAD 06/20/22   Marcine Matar, MD  empagliflozin (JARDIANCE) 25 MG TABS tablet Take 1 tablet (25 mg total) by mouth daily before breakfast. 09/28/22   Eleonore Chiquito, FNP  escitalopram (LEXAPRO) 20 MG tablet Take 1 tablet (20 mg) by mouth daily. 12/22/22  Marcine Matar, MD  glucose blood (ACCU-CHEK GUIDE) test strip Check BS once a day 06/20/22   Marcine Matar, MD  hydrochlorothiazide (HYDRODIURIL) 25 MG tablet Take 1 tablet (25 mg total) by mouth daily. 12/23/22   Marcine Matar, MD  hydrOXYzine (ATARAX/VISTARIL) 25 MG tablet Take 12.5 mg by mouth 2 (two) times daily as needed. 10/02/20   [provider]  Lancets Misc. (ACCU-CHEK FASTCLIX LANCET) KIT UAD 06/20/22   Marcine Matar, MD  levonorgestrel (MIRENA) 20 MCG/24HR IUD 1 each by Intrauterine route once.    [provider]  meloxicam (MOBIC) 15 MG tablet Take 1 tablet (15 mg total) by mouth daily. 07/12/22   Lenda Kelp, MD  Spacer/Aero-Holding Deretha Emory DEVI 1 Units by Does not apply route in the morning, at noon, and at bedtime. 06/20/22   Ellsworth Lennox, PA-C  triamcinolone (KENALOG) 0.025 % ointment APPLY TO AFFECTED  AREA TWICE A DAY 11/08/22   Marcine Matar, MD  TRUEplus Lancets 28G MISC Use as directed 12/27/19   Rema Fendt, NP    Family History Family History  Problem Relation Age of Onset   Hypertension Mother    Stroke Mother    Diabetes Maternal Grandmother    Hypertension Maternal Grandmother    Cancer Maternal Aunt        breast cancer    Anesthesia problems Neg Hx     Social History Social History   Tobacco Use   Smoking status: Former    Types: Cigarettes   Smokeless tobacco: Never  Vaping Use   Vaping status: Never Used  Substance Use Topics   Alcohol use: Yes    Comment: occaisonally   Drug use: No     Allergies   Lisinopril and Metformin and related   Review of Systems Review of Systems Pertinent negatives listed in HPI   Physical Exam Triage Vital Signs ED Triage Vitals  Encounter Vitals Group     BP 12/24/22 1253 (!) 146/87     Systolic BP Percentile --      Diastolic BP Percentile --      Pulse Rate 12/24/22 1253 69     Resp 12/24/22 1253 18     Temp 12/24/22 1253 98.1 F (36.7 C)     Temp Source 12/24/22 1253 Oral     SpO2 12/24/22 1253 96 %     Weight --      Height --      Head Circumference --      Peak Flow --      Pain Score 12/24/22 1254 7     Pain Loc --      Pain Education --      Exclude from Growth Chart --    No data found.  Updated Vital Signs BP (!) 146/87 (BP Location: Left Arm)   Pulse 69   Temp 98.1 F (36.7 C) (Oral)   Resp 18   SpO2 96%   Visual Acuity Right Eye Distance:   Left Eye Distance:   Bilateral Distance:    Right Eye Near:   Left Eye Near:    Bilateral Near:     Physical Exam HENT:     Head: Normocephalic and atraumatic.  Eyes:     Extraocular Movements: Extraocular movements intact.     Pupils: Pupils are equal, round, and reactive to light.  Cardiovascular:     Rate and Rhythm: Normal rate and regular rhythm.  Pulmonary:     Effort:  Pulmonary effort is normal.     Breath sounds: Normal  breath sounds.  Musculoskeletal:     Right shoulder: Bony tenderness present. No swelling. Decreased range of motion. Normal strength.     Left shoulder: Normal.  Skin:    General: Skin is warm.     Capillary Refill: Capillary refill takes less than 2 seconds.  Neurological:     General: No focal deficit present.     Mental Status: She is alert.     UC Treatments / Results  Labs (all labs ordered are listed, but only abnormal results are displayed) Labs Reviewed - No data to display  EKG   Radiology DG Shoulder Right  Result Date: 12/24/2022 CLINICAL DATA:  Right shoulder pain x6 days EXAM: RIGHT SHOULDER - 2+ VIEW COMPARISON:  None Available. FINDINGS: No fracture or dislocation is seen. No focal lytic lesions are seen. No abnormal soft tissue calcifications are seen. IMPRESSION: No acute findings are seen in bony structures in right shoulder. No abnormal soft tissue calcifications are seen. Electronically Signed   By: Ernie Avena M.D.   On: 12/24/2022 14:10    Procedures Procedures (including critical care time)  Medications Ordered in UC Medications - No data to display  Initial Impression / Assessment and Plan / UC Course  I have reviewed the triage vital signs and the nursing notes.  Pertinent labs & imaging results that were available during my care of the patient were reviewed by me and considered in my medical decision making (see chart for details).    Shoulder strain, right Imaging concerning for mild arthritic changes.  Start prednisone 40 mg once daily for 5 days.  For acute pain cyclobenzaprine 10 mg twice daily.  Discussed that effects of Flexeril as it causes severe drowsiness.  Return if symptoms worsen or do not improve.  Patient verbalized understanding and agreement with today's plan.    Final Clinical Impressions(s) / UC Diagnoses   Final diagnoses:  Shoulder strain, right, initial encounter     Discharge Instructions      X-ray shows  mild arthritis changes which is likely contributing to your pain. I am prescribing you prednisone 40 mg daily x 5 days to reduce the inflammation that is causing the pain. For acute pain, start flexeril 10 mg may take up to twice daily(medication may cause severe drowsiness, avoid taking when driving).      ED Prescriptions     Medication Sig Dispense Auth. Provider   predniSONE (DELTASONE) 20 MG tablet Take 2 tablets (40 mg total) by mouth daily with breakfast. 10 tablet Bing Neighbors, NP   cyclobenzaprine (FLEXERIL) 10 MG tablet Take 1 tablet (10 mg total) by mouth 2 (two) times daily as needed for muscle spasms. 20 tablet Bing Neighbors, NP      PDMP not reviewed this encounter.   Bing Neighbors, NP 12/25/22 [id number]

## 2022-12-24 NOTE — Discharge Instructions (Addendum)
X-ray shows mild arthritis changes which is likely contributing to your pain. I am prescribing you prednisone 40 mg daily x 5 days to reduce the inflammation that is causing the pain. For acute pain, start flexeril 10 mg may take up to twice daily(medication may cause severe drowsiness, avoid taking when driving).

## 2022-12-24 NOTE — ED Triage Notes (Signed)
Pt here for right shoulder pain x 6 days; denies obvious injury

## 2022-12-25 DIAGNOSIS — F411 Generalized anxiety disorder: Secondary | ICD-10-CM | POA: Diagnosis not present

## 2022-12-29 ENCOUNTER — Encounter: Payer: Self-pay | Admitting: Family

## 2022-12-30 ENCOUNTER — Ambulatory Visit: Payer: Medicaid Other | Attending: Internal Medicine | Admitting: Internal Medicine

## 2022-12-30 ENCOUNTER — Encounter: Payer: Self-pay | Admitting: Internal Medicine

## 2022-12-30 DIAGNOSIS — I152 Hypertension secondary to endocrine disorders: Secondary | ICD-10-CM | POA: Diagnosis not present

## 2022-12-30 DIAGNOSIS — F332 Major depressive disorder, recurrent severe without psychotic features: Secondary | ICD-10-CM | POA: Diagnosis not present

## 2022-12-30 DIAGNOSIS — E785 Hyperlipidemia, unspecified: Secondary | ICD-10-CM

## 2022-12-30 DIAGNOSIS — E1159 Type 2 diabetes mellitus with other circulatory complications: Secondary | ICD-10-CM

## 2022-12-30 DIAGNOSIS — M25511 Pain in right shoulder: Secondary | ICD-10-CM | POA: Diagnosis not present

## 2022-12-30 DIAGNOSIS — E1169 Type 2 diabetes mellitus with other specified complication: Secondary | ICD-10-CM

## 2022-12-30 DIAGNOSIS — Z6379 Other stressful life events affecting family and household: Secondary | ICD-10-CM | POA: Diagnosis not present

## 2022-12-30 DIAGNOSIS — G4733 Obstructive sleep apnea (adult) (pediatric): Secondary | ICD-10-CM

## 2022-12-30 DIAGNOSIS — F411 Generalized anxiety disorder: Secondary | ICD-10-CM | POA: Diagnosis not present

## 2022-12-30 LAB — POCT GLYCOSYLATED HEMOGLOBIN (HGB A1C): HbA1c, POC (controlled diabetic range): 6.7 % (ref 0.0–7.0)

## 2022-12-30 LAB — GLUCOSE, POCT (MANUAL RESULT ENTRY): POC Glucose: 164 mg/dl — AB (ref 70–99)

## 2022-12-30 MED ORDER — EMPAGLIFLOZIN 25 MG PO TABS: 25.0000 mg | ORAL_TABLET | Freq: Every day | ORAL | 1 refills | Status: DC

## 2022-12-30 MED ORDER — AMLODIPINE BESYLATE 10 MG PO TABS: 10.0000 mg | ORAL_TABLET | Freq: Every day | ORAL | 1 refills | Status: DC

## 2022-12-30 MED ORDER — ATORVASTATIN CALCIUM 20 MG PO TABS: 20.0000 mg | ORAL_TABLET | Freq: Every day | ORAL | 1 refills | Status: DC

## 2022-12-30 NOTE — Patient Instructions (Signed)
His amlodipine to 10 mg daily

## 2022-12-30 NOTE — Progress Notes (Addendum)
Patient ID: Whitney White, female    DOB: 04-22-1976  MRN: [id number]  CC: Hypertension (HTN & DM f/u. Franchot Erichsen flexeril - causing excessive sleep (11 hrs) /)   Subjective: Whitney White is a 47 y.o. female who presents for chronic ds management Her concerns today include:  Pt with hx of HTN, anxiety/depression, GERD, DOE, DM II, HL and obesity.   OSA:  dx with severe OSA.  Awaiting sleep titration study. Placed on Flexeril 10 mg by UC for pain in RT shoulder. This caused her to sleep 12 hrs so she cut the pill in half  -Right shoulder pain: Seen at urgent care 12/24/2022 with pain in the right shoulder x 6 days.  No initiating factors.  Worse with movement.  X-ray did not reveal any abnormal findings.  Patient states she was told she may have arthritis.  Discharged on prednisone and Flexeril.  Reports pain is no better.  She has been taking meloxicam.   DM/obesity: Results for orders placed or performed in visit on 12/30/22  POCT glycosylated hemoglobin (Hb A1C)  Result Value Ref Range   Hemoglobin A1C     HbA1c POC (<> result, manual entry)     HbA1c, POC (prediabetic range)     HbA1c, POC (controlled diabetic range) 6.7 0.0 - 7.0 %  POCT glucose (manual entry)  Result Value Ref Range   POC Glucose 164 (A) 70 - 99 mg/dl  On Jardiance 25 mg Under a lot of stress and increase anxiety; behind on her rent and looking at possible eviction.  Has been over eating and "a lot of the wrong stuff." In regards to her blood pressure: She reports compliance with HCTZ 25 mg and Norvasc 5 mg daily.  Due to poor eating habits recently, she feels that she is getting in more salt in her diet. In regards to the cholesterol, she reports compliance with atorvastatin 20 mg daily.  Last LDL cholesterol was 85. Patient Active Problem List   Diagnosis Date Noted   OSA (obstructive sleep apnea) 12/30/2022   Excessive daytime sleepiness 12/04/2022   Screening for cervical cancer 06/06/2022   Screening  examination for STD (sexually transmitted disease) 06/06/2022   Vaginal bleeding 06/06/2022   Periodic limb movement 03/10/2017   RLS (restless legs syndrome) 01/26/2017   Mild diastolic dysfunction 12/06/2016   Prediabetes 11/26/2016   Hyperlipidemia 11/26/2016   Anxiety state, unspecified 12/22/2013   Class 3 severe obesity with body mass index (BMI) of 50.0 to 59.9 in adult [facility]) 12/22/2013   Chest pain 12/17/2013   Depression    Hypertension    Eczema    Gestational hypertension    GERD (gastroesophageal reflux disease)      Current Outpatient Medications on File Prior to Visit  Medication Sig Dispense Refill   albuterol (VENTOLIN HFA) 108 (90 Base) MCG/ACT inhaler Inhale 1-2 puffs into the lungs every 6 (six) hours as needed for wheezing or shortness of breath. 8 g 0   Blood Glucose Monitoring Suppl (ACCU-CHEK GUIDE ME) w/Device KIT UAD 1 kit 0   cyclobenzaprine (FLEXERIL) 10 MG tablet Take 1 tablet (10 mg total) by mouth 2 (two) times daily as needed for muscle spasms. 20 tablet 0   escitalopram (LEXAPRO) 20 MG tablet Take 1 tablet (20 mg) by mouth daily. 30 tablet 0   glucose blood (ACCU-CHEK GUIDE) test strip Check BS once a day 100 each 12   hydrochlorothiazide (HYDRODIURIL) 25 MG tablet Take 1 tablet (25 mg total)  by mouth daily. 30 tablet 0   hydrOXYzine (ATARAX/VISTARIL) 25 MG tablet Take 12.5 mg by mouth 2 (two) times daily as needed.     Lancets Misc. (ACCU-CHEK FASTCLIX LANCET) KIT UAD 100 kit 3   levonorgestrel (MIRENA) 20 MCG/24HR IUD 1 each by Intrauterine route once.     meloxicam (MOBIC) 15 MG tablet Take 1 tablet (15 mg total) by mouth daily. 30 tablet 1   predniSONE (DELTASONE) 20 MG tablet Take 2 tablets (40 mg total) by mouth daily with breakfast. 10 tablet 0   Spacer/Aero-Holding Chambers DEVI 1 Units by Does not apply route in the morning, at noon, and at bedtime. 1 Units 0   triamcinolone (KENALOG) 0.025 % ointment APPLY TO AFFECTED AREA TWICE A DAY 60 g 0    TRUEplus Lancets 28G MISC Use as directed 100 each 5   No current facility-administered medications on file prior to visit.    Allergies  Allergen Reactions   Lisinopril Cough   Metformin And Related     Caused palpitations/heart pounding    Social History   Socioeconomic History   Marital status: Divorced    Spouse name: Not on file   Number of children: Not on file   Years of education: Not on file   Highest education level: 12th grade  Occupational History   Not on file  Tobacco Use   Smoking status: Former    Types: Cigarettes   Smokeless tobacco: Never  Vaping Use   Vaping status: Never Used  Substance and Sexual Activity   Alcohol use: Yes    Comment: occaisonally   Drug use: No   Sexual activity: Yes    Birth control/protection: I.U.D.    Comment: mirena in place  Other Topics Concern   Not on file  Social History Narrative   Are you right handed or left handed? left   Are you currently employed ? 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 no   Who lives with you? With children   What type of home do you live in: 1 story or 2 story? 2 story         Social Determinants of Health   Financial Resource Strain: High Risk (09/28/2022)   Overall Financial Resource Strain (CARDIA)    Difficulty of Paying Living Expenses: Very hard  Food Insecurity: Food Insecurity Present (09/28/2022)   Hunger Vital Sign    Worried About Running Out of Food in the Last Year: Often true    Ran Out of Food in the Last Year: Often true  Transportation Needs: No Transportation Needs (09/28/2022)   PRAPARE - Administrator, Civil Service (Medical): No    Lack of Transportation (Non-Medical): No  Physical Activity: Unknown (09/28/2022)   Exercise Vital Sign    Days of Exercise per Week: Patient declined    Minutes of Exercise per Session: Not on file  Stress: Stress Concern Present (09/28/2022)   [employer] of Occupational  Health - Occupational Stress Questionnaire    Feeling of Stress : Rather much  Social Connections: Unknown (09/28/2022)   Social Connection and Isolation Panel [NHANES]    Frequency of Communication with Friends and Family: Not on file    Frequency of Social Gatherings with Friends and Family: Once a week    Attends Religious Services: 1 to 4 times per year    Active Member of [employer] or Organizations: Not on file  Attends Banker Meetings: Not on file    Marital Status: Divorced  Intimate Partner Violence: Not on file    Family History  Problem Relation Age of Onset   Hypertension Mother    Stroke Mother    Diabetes Maternal Grandmother    Hypertension Maternal Grandmother    Cancer Maternal Aunt        breast cancer    Anesthesia problems Neg Hx     Past Surgical History:  Procedure Laterality Date   NO PAST SURGERIES      ROS: Review of Systems Negative except as stated above  PHYSICAL EXAM: BP (!) 135/90 (BP Location: Left Arm, Patient Position: Sitting, Cuff Size: Large)   Pulse 90   Temp 98.2 F (36.8 C) (Oral)   Ht 5\' 2"  (1.575 m)   Wt 290 lb (131.5 kg)   SpO2 (!) 87%   BMI 53.04 kg/m   Physical Exam  General appearance - alert, well appearing, morbidly obese African-American female and in no distress Mental status - normal mood, behavior, speech, dress, motor activity, and thought processes Neck - supple, no significant adenopathy Chest - clear to auscultation, no wheezes, rales or rhonchi, symmetric air entry Heart - normal rate, regular rhythm, normal S1, S2, no murmurs, rubs, clicks or gallops Extremities - peripheral pulses normal, no pedal edema, no clubbing or cyanosis Diabetic Foot Exam - Simple   Simple Foot Form Diabetic Foot exam was performed with the following findings: Yes 12/30/2022  4:46 PM  Visual Inspection See comments: Yes Sensation Testing Intact to touch and monofilament testing bilaterally: Yes Pulse  Check Comments Flat-footed         Latest Ref Rng & Units 03/27/2022   10:15 AM 08/24/2021    4:51 PM 12/12/2020    4:16 PM  CMP  Glucose 70 - 99 mg/dL 96  98  88   BUN 6 - 24 mg/dL 9  6  8    Creatinine 0.57 - 1.00 mg/dL 7.78  2.42  3.53   Sodium 134 - 144 mmol/L 141  140  140   Potassium 3.5 - 5.2 mmol/L 3.8  4.1  4.1   Chloride 96 - 106 mmol/L 96  100  95   CO2 20 - 29 mmol/L 28  29  27    Calcium 8.7 - 10.2 mg/dL 9.8  9.5  61.4   Total Protein 6.0 - 8.5 g/dL 7.3   8.0   Total Bilirubin 0.0 - 1.2 mg/dL 0.7   0.8   Alkaline Phos 44 - 121 IU/L 68   80   AST 0 - 40 IU/L 15   18   ALT 0 - 32 IU/L 15   16    Lipid Panel     Component Value Date/Time   CHOL 139 03/27/2022 1015   TRIG 109 03/27/2022 1015   HDL 34 (L) 03/27/2022 1015   CHOLHDL 4.1 03/27/2022 1015   CHOLHDL 4.5 01/06/2014 1241   VLDL 17 01/06/2014 1241   LDLCALC 85 03/27/2022 1015    CBC    Component Value Date/Time   WBC 8.4 12/12/2020 1616   WBC 8.1 08/24/2016 0042   RBC 5.53 (H) 12/12/2020 1616   RBC 5.06 08/24/2016 0042   HGB 13.3 12/12/2020 1616   HCT 43.0 12/12/2020 1616   PLT 429 12/12/2020 1616   MCV 78 (L) 12/12/2020 1616   MCH 24.1 (L) 12/12/2020 1616   MCH 24.1 (L) 08/24/2016 0042   MCHC 30.9 (L) 12/12/2020  1616   MCHC 31.4 08/24/2016 0042   RDW 13.9 12/12/2020 1616   LYMPHSABS 2.0 08/24/2016 0042   MONOABS 0.4 08/24/2016 0042   EOSABS 0.1 08/24/2016 0042   BASOSABS 0.0 08/24/2016 0042    ASSESSMENT AND PLAN:  1. Type 2 diabetes mellitus with morbid obesity (HCC) A1c is at goal. Encourage healthy eating habits.  She has done a lot of binge eating lately due to increased stress. We discussed placing her on Ozempic to assist with weight loss.  I went over how the medication works and possible side effects of the medicine including nausea, vomiting, diarrhea/constipation, acute pancreatitis, bowel blockage and palpitations.  Patient states she will think about it and let me know if she  decides to try the medication. - POCT glycosylated hemoglobin (Hb A1C) - POCT glucose (manual entry) - empagliflozin (JARDIANCE) 25 MG TABS tablet; Take 1 tablet (25 mg total) by mouth daily before breakfast.  Dispense: 90 tablet; Refill: 1 - Ambulatory referral to Ophthalmology - CBC - Comprehensive metabolic panel - Lipid panel  2. Hypertension associated with type 2 diabetes mellitus (HCC) Not at goal.  Increase amlodipine to 10 mg daily.  Continue HCTZ 25 mg daily. - amLODipine (NORVASC) 10 MG tablet; Take 1 tablet (10 mg total) by mouth daily.  Dispense: 90 tablet; Refill: 1  3. Hyperlipidemia associated with type 2 diabetes mellitus (HCC) Continue atorvastatin - atorvastatin (LIPITOR) 20 MG tablet; Take 1 tablet (20 mg total) by mouth daily.  Dispense: 90 tablet; Refill: 1  4. Acute pain of right shoulder Continue meloxicam.  Refer to orthopedics. - Ambulatory referral to Orthopedics  5. OSA (obstructive sleep apnea) Patient has been referred for titration study.  Advised that if she is not called by the sleep lab in 1 week, she should send me a MyChart message so that we can follow-up on this.  I would like for her to get CPAP as soon as possible  6. Stressful life event affecting family Patient states she is currently working with family to see if they can assist her financially and getting caught up on her bills.    Patient was given the opportunity to ask questions.  Patient verbalized understanding of the plan and was able to repeat key elements of the plan.   This documentation was completed using Paediatric nurse.  Any transcriptional errors are unintentional.  Orders Placed This Encounter  Procedures   CBC   Comprehensive metabolic panel   Lipid panel   Ambulatory referral to Orthopedics   Ambulatory referral to Ophthalmology   POCT glycosylated hemoglobin (Hb A1C)   POCT glucose (manual entry)     Requested Prescriptions   Signed  Prescriptions Disp Refills   amLODipine (NORVASC) 10 MG tablet 90 tablet 1    Sig: Take 1 tablet (10 mg total) by mouth daily.   atorvastatin (LIPITOR) 20 MG tablet 90 tablet 1    Sig: Take 1 tablet (20 mg total) by mouth daily.   empagliflozin (JARDIANCE) 25 MG TABS tablet 90 tablet 1    Sig: Take 1 tablet (25 mg total) by mouth daily before breakfast.    Return in about 4 months (around 05/02/2023).  Jonah Fowle, MD, FACP

## 2023-01-01 ENCOUNTER — Other Ambulatory Visit: Payer: Self-pay | Admitting: Internal Medicine

## 2023-01-01 DIAGNOSIS — Z5986 Financial insecurity: Secondary | ICD-10-CM | POA: Diagnosis not present

## 2023-01-01 DIAGNOSIS — F411 Generalized anxiety disorder: Secondary | ICD-10-CM | POA: Diagnosis not present

## 2023-01-01 DIAGNOSIS — Z630 Problems in relationship with spouse or partner: Secondary | ICD-10-CM | POA: Diagnosis not present

## 2023-01-01 DIAGNOSIS — E1169 Type 2 diabetes mellitus with other specified complication: Secondary | ICD-10-CM

## 2023-01-01 MED ORDER — ATORVASTATIN CALCIUM 40 MG PO TABS: 40.0000 mg | ORAL_TABLET | Freq: Every day | ORAL | 1 refills | Status: DC

## 2023-01-03 ENCOUNTER — Ambulatory Visit (INDEPENDENT_AMBULATORY_CARE_PROVIDER_SITE_OTHER): Payer: Medicaid Other | Admitting: Physician Assistant

## 2023-01-03 ENCOUNTER — Encounter: Payer: Self-pay | Admitting: Physician Assistant

## 2023-01-03 DIAGNOSIS — M25511 Pain in right shoulder: Secondary | ICD-10-CM

## 2023-01-03 DIAGNOSIS — F411 Generalized anxiety disorder: Secondary | ICD-10-CM | POA: Diagnosis not present

## 2023-01-03 DIAGNOSIS — Z630 Problems in relationship with spouse or partner: Secondary | ICD-10-CM | POA: Diagnosis not present

## 2023-01-03 MED ORDER — LIDOCAINE HCL 1 % IJ SOLN
2.0000 mL | INTRAMUSCULAR | Status: AC | PRN
Start: 2023-01-03 — End: 2023-01-03
  Administered 2023-01-03: 2 mL

## 2023-01-03 MED ORDER — METHYLPREDNISOLONE ACETATE 40 MG/ML IJ SUSP
60.0000 mg | INTRAMUSCULAR | Status: AC | PRN
Start: 2023-01-03 — End: 2023-01-03
  Administered 2023-01-03: 60 mg via INTRA_ARTICULAR

## 2023-01-03 MED ORDER — BUPIVACAINE HCL 0.25 % IJ SOLN
2.0000 mL | INTRAMUSCULAR | Status: AC | PRN
Start: 2023-01-03 — End: 2023-01-03
  Administered 2023-01-03: 2 mL via INTRA_ARTICULAR

## 2023-01-03 NOTE — Progress Notes (Signed)
Office Visit Note   Patient: Whitney White           Date of Birth: [birth year]           MRN: [id number] Visit Date: 01/03/2023              Requested by: Marcine Matar, MD [address] [place],  [place] [address] PCP: Marcine Matar, MD   Assessment & Plan: Visit Diagnoses:  1. Acute pain of right shoulder     Plan: Pleasant 47 year old woman who admits she may have had some problems in the past with a right shoulder but no real consistent pain.  She denies any fever or chills.  She had onset of right shoulder pain that is making it difficult for her to sleep.  She has no evidence of any radicular findings or any neck pain.  X-rays do show some degenerative changes within the [facility] joint.  On exam today she does have quite a bit of impingement findings.  She has not taken her last steroid pill.  Does not think it is very helpful.  I talked about trying an subacromial injection today she does have a hemoglobin A1c under 7.  She understands this may elevate her blood sugars should adjust if needed.  I would like also like for her to engage with physical therapy will write an order for this.  May follow-up with me in 1 month however if she has increasing pain or other symptoms to call me  Follow-Up Instructions: No follow-ups on file.   Orders:  No orders of the defined types were placed in this encounter.  No orders of the defined types were placed in this encounter.     Procedures: Large Joint Inj: R subacromial bursa on 01/03/2023 10:17 AM Indications: diagnostic evaluation and pain Details: 25 G 1.5 in needle, posterior approach  Arthrogram: No  Medications: 2 mL lidocaine 1 %; 60 mg methylPREDNISolone acetate 40 MG/ML; 2 mL bupivacaine 0.25 % Outcome: tolerated well, no immediate complications Procedure, treatment alternatives, risks and benefits explained, specific risks discussed. Consent was given by the patient.     Clinical Data: No additional  findings.   Subjective: Chief Complaint  Patient presents with  . Right Shoulder - Pain    HPI patient is a pleasant 47 year old woman with a history of right shoulder pain.  She was seen and evaluated about 8 days ago.  X-rays done at urgent care showed some degenerative changes of the [facility] joint.  She was given prednisone for 5 days as well as muscle relaxant she does not feel this is helping.  She denies any recent injury no surgery no prior injection she is a diabetic but most recent hemoglobin A1c was under 7  Review of Systems  All other systems reviewed and are negative.    Objective: Vital Signs: There were no vitals taken for this visit.  Physical Exam Constitutional:      Appearance: Normal appearance.  Pulmonary:     Effort: Pulmonary effort is normal.  Skin:    General: Skin is warm and dry.  Neurological:     General: No focal deficit present.     Mental Status: She is alert and oriented to person, place, and time.    Ortho Exam Examination of her right shoulder she has no redness no erythema she actually has good range of motion with forward extension slight decrease in internal rotation behind the back her abductor strength  external and internal rotation strength is 5 out of 5.  She has no pain with external rotation.  She does have a positive empty can test positive speeds test and positive impingement findings.  Grip strength is intact she is distally neurovascularly intact Specialty Comments:  No specialty comments available.  Imaging: No results found.   PMFS History: Patient Active Problem List   Diagnosis Date Noted  . Pain in right shoulder 01/03/2023  . OSA (obstructive sleep apnea) 12/30/2022  . Excessive daytime sleepiness 12/04/2022  . Screening for cervical cancer 06/06/2022  . Screening examination for STD (sexually transmitted disease) 06/06/2022  . Vaginal bleeding 06/06/2022  . Periodic limb movement 03/10/2017  . RLS (restless legs  syndrome) 01/26/2017  . Mild diastolic dysfunction 12/06/2016  . Prediabetes 11/26/2016  . Hyperlipidemia 11/26/2016  . Anxiety state, unspecified 12/22/2013  . Class 3 severe obesity with body mass index (BMI) of 50.0 to 59.9 in adult (HCC) 12/22/2013  . Chest pain 12/17/2013  . Depression   . Hypertension   . Eczema   . Gestational hypertension   . GERD (gastroesophageal reflux disease)    Past Medical History:  Diagnosis Date  . Depression   . Diabetes mellitus without complication (HCC)    "pre-diabetic"  . Eczema   . GERD (gastroesophageal reflux disease)   . Gestational hypertension   . Hypertension     Family History  Problem Relation Age of Onset  . Hypertension Mother   . Stroke Mother   . Diabetes Maternal Grandmother   . Hypertension Maternal Grandmother   . Cancer Maternal Aunt        breast cancer   . Anesthesia problems Neg Hx     Past Surgical History:  Procedure Laterality Date  . NO PAST SURGERIES     Social History   Occupational History  . Not on file  Tobacco Use  . Smoking status: Former    Types: Cigarettes  . Smokeless tobacco: Never  Vaping Use  . Vaping status: Never Used  Substance and Sexual Activity  . Alcohol use: Yes    Comment: occaisonally  . Drug use: No  . Sexual activity: Yes    Birth control/protection: I.U.D.    Comment: mirena in place

## 2023-01-03 NOTE — Addendum Note (Signed)
Addended by: Gretta Arab C on: 01/03/2023 10:20 AM   Modules accepted: Orders

## 2023-01-15 ENCOUNTER — Telehealth: Payer: Medicaid Other | Admitting: Nurse Practitioner

## 2023-01-15 DIAGNOSIS — Z630 Problems in relationship with spouse or partner: Secondary | ICD-10-CM | POA: Diagnosis not present

## 2023-01-15 DIAGNOSIS — F411 Generalized anxiety disorder: Secondary | ICD-10-CM | POA: Diagnosis not present

## 2023-01-18 ENCOUNTER — Other Ambulatory Visit: Payer: Self-pay | Admitting: Internal Medicine

## 2023-01-18 DIAGNOSIS — I10 Essential (primary) hypertension: Secondary | ICD-10-CM

## 2023-01-20 NOTE — Telephone Encounter (Signed)
Requested Prescriptions  Pending Prescriptions Disp Refills   hydrochlorothiazide (HYDRODIURIL) 25 MG tablet [Pharmacy Med Name: HYDROCHLOROTHIAZIDE 25 MG TAB] 90 tablet 1    Sig: TAKE 1 TABLET (25 MG TOTAL) BY MOUTH DAILY.     Cardiovascular: Diuretics - Thiazide Failed - 01/18/2023 11:37 AM      Failed - Last BP in normal range    BP Readings from Last 1 Encounters:  12/30/22 (!) 135/90         Passed - Cr in normal range and within 180 days    Creatinine, Ser  Date Value Ref Range Status  12/30/2022 0.78 0.57 - 1.00 mg/dL Final         Passed - K in normal range and within 180 days    Potassium  Date Value Ref Range Status  12/30/2022 4.1 3.5 - 5.2 mmol/L Final         Passed - Na in normal range and within 180 days    Sodium  Date Value Ref Range Status  12/30/2022 138 134 - 144 mmol/L Final         Passed - Valid encounter within last 6 months    Recent Outpatient Visits           3 weeks ago Type 2 diabetes mellitus with morbid obesity (HCC)   [facility] Marcine Matar, MD   3 months ago Periodic limb movement   [place] [facility] Riggins, FNP   9 months ago Type 2 diabetes mellitus with morbid obesity [facility])   [place] [facility] [place], [place] W, NP   1 year ago Type 2 diabetes mellitus with morbid obesity [facility])   [place] [facility] Jonah Audi B, MD   2 years ago Type 2 diabetes mellitus with morbid obesity [facility])   [place] [facility] Marcine Matar, MD       Future Appointments             In 2 weeks Persons, [place], PA [facility]   In 1 month [place], Ruby Cola, NP [place] [facility]   In 3 months Marcine Matar, MD [place] [facility]

## 2023-01-29 DIAGNOSIS — F411 Generalized anxiety disorder: Secondary | ICD-10-CM | POA: Diagnosis not present

## 2023-02-04 ENCOUNTER — Ambulatory Visit: Payer: Medicaid Other | Admitting: Physician Assistant

## 2023-02-05 ENCOUNTER — Telehealth: Payer: Self-pay | Admitting: Internal Medicine

## 2023-02-05 DIAGNOSIS — F411 Generalized anxiety disorder: Secondary | ICD-10-CM | POA: Diagnosis not present

## 2023-02-05 DIAGNOSIS — Z630 Problems in relationship with spouse or partner: Secondary | ICD-10-CM | POA: Diagnosis not present

## 2023-02-05 DIAGNOSIS — G4733 Obstructive sleep apnea (adult) (pediatric): Secondary | ICD-10-CM

## 2023-02-05 DIAGNOSIS — E1169 Type 2 diabetes mellitus with other specified complication: Secondary | ICD-10-CM

## 2023-02-05 MED ORDER — SEMAGLUTIDE(0.25 OR 0.5MG/DOS) 2 MG/3ML ~~LOC~~ SOPN: 0.2500 mg | PEN_INJECTOR | SUBCUTANEOUS | 1 refills | Status: DC

## 2023-02-05 NOTE — Telephone Encounter (Signed)
Phone call placed to patient today.  Patient advised that her insurance did not approve for the titration sleep study because she did not meet certain criteria.  Advised patient that we can move forward instead with putting her on AutoPap.  I explained to her what that he is and how it works.  She is agreeable to this.  Pres[facility] to [facility]. And also inquired whether she can go ahead and stop the Ozempic.  On last visit with me we had talked about it but she wanted to think about it first.  She states she is now wanting to get on the medication because her appetite remains out of control.  I told her that we can start Ozempic.  For possible side effects with her nausea, vomiting, pancreatitis, bowel blockage, significant constipation/diarrhea.  Advised to stop the medicine if she develops any vomiting or severe abdominal pain as this can be indicative of pancreatitis or bowel blockage.  She expressed understanding.  We will start on the lowest dose of 0.25 mg once a week.  Advised patient to call and schedule follow-up appointment with me in 2 months to see how she is doing on that dose.  If she is tolerating it okay, we can then increase the dose.  Advised to have the pharmacist show her how to administer the injection.  All questions were answered.

## 2023-02-10 NOTE — Progress Notes (Signed)
NEUROLOGY FOLLOW UP OFFICE NOTE  Whitney White [id number]  Subjective:  Whitney White is a 47 y.o. year old left-handed female with a medical history of HTN, HLD, DM, GERD, and anxiety who we last saw on 11/07/22 for abnormal movements in sleep.  To briefly review: Patient is being told she has is kicking and punching in her sleep by her significant other. This has been going on at least 6 months. Patient is sleeping and is usually not aware of the symptoms. She has woken up to punching or kicking. She denies significant pain in her legs or restlessness wanting to move. She does remember her dreams as they tend to be vivid. She can wake up crying. She also endorses very loud snoring. Patient does not feel rested when waking in the morning.   Patient had a sleep study in 2018 that showed periodic limb movements. She does not think it showed sleep apnea. She never had any treatment of this.   Of note, patient has gained weight since 2018 sleep study.   Patient is unsure if another sleep study has been ordered.   Patient denies any reduction in sense of smell. She denies significant tremors. She denies freezing or imbalance and falls.   She mentions she was told she had neuropathy in the past and given gabapentin. She does not take it anymore because she does not have symptoms often anymore. She tolerated the medication well. She had numbness and tingling in her feet and hands. Since she controlled her DM better, her symptoms improved.   Patient has previously taken clonazepam for anxiety in the past. She did not have any side effects to this medication.   She does not report any constitutional symptoms like fever, night sweats, anorexia or unintentional weight loss.   EtOH use: 1 beer per day  Restrictive diet? No Family history of neuropathy/myopathy/neurologic disease? Grandmother with Alzheimer's disease  Most recent Assessment and Plan (11/07/22): Whitney White is a 47 y.o. female who  presents for evaluation of kicking and punching while sleeping. She has a relevant medical history of HTN, HLD, DM, GERD, and anxiety. Her neurological examination is essentially normal today. Available diagnostic data is significant for HbA1c of 6.5, ferritin in 2019 was 201, and sleep study in 2018 that did not show OSA but did show mild periodic limb movements of sleep. Given her prior sleep study, periodic limb movement disorder is certainly possible. Kicking and punching while dreaming could also be consistent with a REM sleep disorder. She does not have symptoms when awake that improvement with moving, so RLS is less likely. I will work up as follows   PLAN: -Blood work: iron studies, TSH, B12, IFE -Start melatonin 3 mg prior to bedtime -Sleep medicine referral -Will order a sleep study  Since their last visit: Labs were normal (ferritin of 101).  Patient saw sleep medicine on 12/04/22 and had a sleep study on 12/19/22 that was consistent with severe OSA. There were no significant periodic limb movements during sleep. She is scheduled to follow up with sleep medicine on 02/24/23. She has not been started on CPAP yet.  Per patient, she has not been told recently that she is attacking people. She does feel like her sleep has been more restful. She is still sleepy during the day. She did take melatonin for a while, but stopped. She thinks it may have helped.  Patient does occasionally notice tingling in fingers and toes that is intermittently over  the last week. She also has pain in her right rotator cuff for which she sees ortho and gets steroid shots. Otherwise, she denies significant pain.  MEDICATIONS:  Outpatient Encounter Medications as of 02/19/2023  Medication Sig   albuterol (VENTOLIN HFA) 108 (90 Base) MCG/ACT inhaler Inhale 1-2 puffs into the lungs every 6 (six) hours as needed for wheezing or shortness of breath.   amLODipine (NORVASC) 10 MG tablet Take 1 tablet (10 mg total) by mouth  daily.   atorvastatin (LIPITOR) 40 MG tablet Take 1 tablet (40 mg total) by mouth daily. Dose increase 01/01/2023   Blood Glucose Monitoring Suppl (ACCU-CHEK GUIDE ME) w/Device KIT UAD   cyclobenzaprine (FLEXERIL) 10 MG tablet Take 1 tablet (10 mg total) by mouth 2 (two) times daily as needed for muscle spasms.   empagliflozin (JARDIANCE) 25 MG TABS tablet Take 1 tablet (25 mg total) by mouth daily before breakfast.   escitalopram (LEXAPRO) 20 MG tablet Take 1 tablet (20 mg) by mouth daily.   glucose blood (ACCU-CHEK GUIDE) test strip Check BS once a day   hydrochlorothiazide (HYDRODIURIL) 25 MG tablet TAKE 1 TABLET (25 MG TOTAL) BY MOUTH DAILY.   hydrOXYzine (ATARAX/VISTARIL) 25 MG tablet Take 12.5 mg by mouth 2 (two) times daily as needed.   Lancets Misc. (ACCU-CHEK FASTCLIX LANCET) KIT UAD   levonorgestrel (MIRENA) 20 MCG/24HR IUD 1 each by Intrauterine route once.   Semaglutide,0.25 or 0.5MG /DOS, 2 MG/3ML SOPN Inject 0.25 mg into the skin once a week.   Spacer/Aero-Holding Chambers DEVI 1 Units by Does not apply route in the morning, at noon, and at bedtime.   triamcinolone (KENALOG) 0.025 % ointment APPLY TO AFFECTED AREA TWICE A DAY   TRUEplus Lancets 28G MISC Use as directed   meloxicam (MOBIC) 15 MG tablet Take 1 tablet (15 mg total) by mouth daily. (Patient not taking: Reported on 02/19/2023)   predniSONE (DELTASONE) 20 MG tablet Take 2 tablets (40 mg total) by mouth daily with breakfast. (Patient not taking: Reported on 02/19/2023)   [DISCONTINUED] escitalopram (LEXAPRO) 20 MG tablet Take 1 tablet (20 mg) by mouth daily.   No facility-administered encounter medications on file as of 02/19/2023.    PAST MEDICAL HISTORY: Past Medical History:  Diagnosis Date   Depression    Diabetes mellitus without complication (HCC)    "pre-diabetic"   Eczema    GERD (gastroesophageal reflux disease)    Gestational hypertension    Hypertension     PAST SURGICAL HISTORY: Past Surgical History:   Procedure Laterality Date   NO PAST SURGERIES      ALLERGIES: Allergies  Allergen Reactions   Lisinopril Cough   Metformin And Related     Caused palpitations/heart pounding    FAMILY HISTORY: Family History  Problem Relation Age of Onset   Hypertension Mother    Stroke Mother    Diabetes Maternal Grandmother    Hypertension Maternal Grandmother    Cancer Maternal Aunt        breast cancer    Anesthesia problems Neg Hx     SOCIAL HISTORY: Social History   Tobacco Use   Smoking status: Former    Types: Cigarettes   Smokeless tobacco: Never  Vaping Use   Vaping status: Never Used  Substance Use Topics   Alcohol use: Yes    Comment: occaisonally   Drug use: No   Social History   Social History Narrative   Are you right handed or left handed? left   Are you  currently employed ? 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 no   Who lives with you? With children   What type of home do you live in: 1 story or 2 story? 2 story            Objective:  Vital Signs:  BP 114/73   Pulse 74   Ht 5\' 2"  (1.575 m)   Wt 295 lb (133.8 kg)   SpO2 96%   BMI 53.96 kg/m   General: No acute distress.  Patient appears well-groomed.   Head:  Normocephalic/atraumatic Neck: supple, no paraspinal tenderness, full range of motion Lungs:  Non-labored breathing on room air  Neurological Exam: alert and oriented.  Speech fluent and not dysarthric, language intact.  CN II-XII intact. Bulk and tone normal, muscle strength 5/5 throughout.  Sensation to light touch and vibration intact.  Deep tendon reflexes 2+ throughout.  Finger to nose testing intact.  Gait normal, Romberg negative.   Labs and Imaging review: New results: 11/07/22: Iron studies: normal, ferritin 101 B12: 372 IFE no M protein TSH wnl  HbA1c (12/30/22): 6.7  Sleep study (12/19/22): IMPRESSIONS - Severe obstructive sleep apnea occurred during this study (AHI = 153.6/h). -  Moderate oxygen desaturation was noted during this study (Min O2 = 74.0%, Mean 88.5%). Time withO2 saturation 88% or less was 124 minutes. - The patient snored with moderate snoring volume. - No cardiac abnormalities were noted during this study. - Clinically significant periodic limb movements did not occur during sleep. No significant associated arousals.  Previously reviewed results: HbA1c (09/28/22): 6.5 CMP (03/27/22): unremarkable TSH (10/30/16): 1.110 Iron studies (10/10/17): normal, ferritin 201   Imaging/studies: Sleep study (01/20/17): IMPRESSIONS - No significant obstructive sleep apnea occurred during this study (AHI = 3.5/h). - No significant central sleep apnea occurred during this study (CAI = 0.0/h). - The patient had minimal or no oxygen desaturation during the study (Min O2 = 89.00%) - The patient snored with Loud snoring volume. - No cardiac abnormalities were noted during this study. - Mild periodic limb movements of sleep occurred during the study. Associated arousals were significant.   DIAGNOSIS - Periodic Limb Movement Syndrome (327.51 [G47.61 ICD-10]) - Primary Snoring (786.09 [R06.83 ICD-10])   RECOMMENDATIONS - Consider therapeutic trial for limb movement sleep disorder, such as Requip or Mirapex. - Management for snoring might include decongestants, chin strap. Other options based on clinical judgment. - Sleep hygiene should be reviewed to assess factors that may improve sleep quality. - Weight management and regular exercise should be initiated or continued if appropriate.  Assessment/Plan:  This is Whitney White, a 47 y.o. female with severe OSA and abnormal movements in sleep. This could be related to OSA and due to periodic limb movement disorder (which she has been told she had previously) or a REM sleep disorder. Neither of these were seen on recent sleep study. She is following up soon with sleep medicine to discuss CPAP. She also likely has mild  diabetic neuropathy, currently with only intermittent mild symptoms.   Plan: -Follow up with sleep medicine as planned -Monitor neuropathy symptoms (numbness and tingling in feet and fingers) -Discussed the importance of glucose control in preventing future worsening of neuropathy  Return to clinic as needed   Jacquelyne Balint, MD

## 2023-02-11 ENCOUNTER — Encounter: Payer: Self-pay | Admitting: Internal Medicine

## 2023-02-11 ENCOUNTER — Ambulatory Visit: Payer: Medicaid Other | Admitting: Physical Therapy

## 2023-02-13 ENCOUNTER — Other Ambulatory Visit: Payer: Self-pay | Admitting: Internal Medicine

## 2023-02-13 ENCOUNTER — Other Ambulatory Visit: Payer: Self-pay

## 2023-02-13 DIAGNOSIS — F411 Generalized anxiety disorder: Secondary | ICD-10-CM

## 2023-02-14 ENCOUNTER — Other Ambulatory Visit (HOSPITAL_COMMUNITY): Payer: Self-pay

## 2023-02-14 ENCOUNTER — Telehealth: Payer: Self-pay

## 2023-02-14 MED ORDER — ESCITALOPRAM OXALATE 20 MG PO TABS
20.0000 mg | ORAL_TABLET | Freq: Every day | ORAL | 6 refills | Status: AC
Start: 2023-02-14 — End: ?
  Filled 2023-02-14 – 2023-02-15 (×2): qty 30, 30d supply, fill #0
  Filled 2023-03-25: qty 30, 30d supply, fill #1
  Filled 2023-06-11 – 2023-10-10 (×2): qty 30, 30d supply, fill #2

## 2023-02-14 NOTE — Telephone Encounter (Signed)
Pharmacy Patient Advocate Encounter  Received notification from [facility] that Prior Authorization for [facility] has been APPROVED from 02/13/2023 to 02/13/2024   PA #/Case ID/Reference #: [id number]

## 2023-02-15 ENCOUNTER — Other Ambulatory Visit (HOSPITAL_COMMUNITY): Payer: Self-pay

## 2023-02-17 ENCOUNTER — Other Ambulatory Visit (HOSPITAL_COMMUNITY): Payer: Self-pay

## 2023-02-19 ENCOUNTER — Encounter: Payer: Self-pay | Admitting: Neurology

## 2023-02-19 ENCOUNTER — Ambulatory Visit: Payer: Medicaid Other | Admitting: Neurology

## 2023-02-19 VITALS — BP 114/73 | HR 74 | Ht 62.0 in | Wt 295.0 lb

## 2023-02-19 DIAGNOSIS — E1142 Type 2 diabetes mellitus with diabetic polyneuropathy: Secondary | ICD-10-CM

## 2023-02-19 DIAGNOSIS — G4733 Obstructive sleep apnea (adult) (pediatric): Secondary | ICD-10-CM

## 2023-02-19 DIAGNOSIS — R0683 Snoring: Secondary | ICD-10-CM

## 2023-02-19 DIAGNOSIS — R259 Unspecified abnormal involuntary movements: Secondary | ICD-10-CM | POA: Diagnosis not present

## 2023-02-19 NOTE — Patient Instructions (Signed)
Follow up with sleep medicine as planned for severe sleep apnea.  I would continue the melatonin prior to bedtime to help with sleep.  Follow up with me as needed, particularly if you experience worsening numbness or tingling.  The physicians and staff at [facility] are committed to providing excellent care. You may receive a survey requesting feedback about your experience at our office. We strive to receive "very good" responses to the survey questions. If you feel that your experience would prevent you from giving the office a "very good " response, please contact our office to try to remedy the situation. We may be reached at [phone]. Thank you for taking the time out of your busy day to complete the survey.  Jacquelyne Balint, MD [facility]

## 2023-02-24 ENCOUNTER — Telehealth: Payer: Medicaid Other | Admitting: Nurse Practitioner

## 2023-02-24 ENCOUNTER — Encounter: Payer: Self-pay | Admitting: Nurse Practitioner

## 2023-02-24 DIAGNOSIS — G4733 Obstructive sleep apnea (adult) (pediatric): Secondary | ICD-10-CM

## 2023-02-24 DIAGNOSIS — Z6841 Body Mass Index (BMI) 40.0 and over, adult: Secondary | ICD-10-CM | POA: Diagnosis not present

## 2023-02-24 NOTE — Assessment & Plan Note (Signed)
Healthy weight loss encouraged

## 2023-02-24 NOTE — Patient Instructions (Addendum)
Your sleep study revealed very severe sleep apnea. You will need to start CPAP therapy ASAP. I am going to send an updated order for the CPAP machine since you have not heard anything. They should contact you this week. Please call me if you haven't heard something by Friday.  Start CPAP 5-20 cmH2O, mask of choice and heated humidity, every night, minimum of 4-6 hours a night.  Change equipment as directed. Wash your tubing with warm soap and water daily, hang to dry. Wash humidifier portion weekly. Use bottled, distilled water and change daily  Be aware of reduced alertness and do not drive or operate heavy machinery if experiencing this or drowsiness.  Exercise encouraged, as tolerated. Healthy weight management discussed.  Notify if persistent daytime sleepiness occurs even with consistent use of CPAP.  Change supplies... Every month Mask cushions and/or nasal pillows CPAP machine filters Every 3 months Mask frame (not including the headgear) CPAP tubing Every 6 months Mask headgear Chin strap (if applicable) Humidifier water tub  I have ordered an urgent in lab CPAP titration study. I may have to have a conversation with your insurance on why this is required. I will keep you updated.  Follow up in 8 weeks with Dr. Wynona Neat or Philis Nettle. If symptoms do not improve or worsen, please contact office for sooner follow up

## 2023-02-24 NOTE — Assessment & Plan Note (Signed)
Very severe OSA. She needs an urgent in lab titration study given severity and significant oxygen desaturations. Will place orders today. Appears that previous CPAP orders were entered incorrectly. Will re-order urgent new start CPAP 5-20 cmH2O, mask of choice and heated humidity for her to start utilizing while we wait on titration study. Educated on proper use/care of device. Risks/benefits reviewed. Healthy weight loss encouraged. Safe sleep and driving precautions discussed.  Patient Instructions  Your sleep study revealed very severe sleep apnea. You will need to start CPAP therapy ASAP. I am going to send an updated order for the CPAP machine since you have not heard anything. They should contact you this week. Please call me if you haven't heard something by Friday.  Start CPAP 5-20 cmH2O, mask of choice and heated humidity, every night, minimum of 4-6 hours a night.  Change equipment as directed. Wash your tubing with warm soap and water daily, hang to dry. Wash humidifier portion weekly. Use bottled, distilled water and change daily  Be aware of reduced alertness and do not drive or operate heavy machinery if experiencing this or drowsiness.  Exercise encouraged, as tolerated. Healthy weight management discussed.  Notify if persistent daytime sleepiness occurs even with consistent use of CPAP.  Change supplies... Every month Mask cushions and/or nasal pillows CPAP machine filters Every 3 months Mask frame (not including the headgear) CPAP tubing Every 6 months Mask headgear Chin strap (if applicable) Humidifier water tub  I have ordered an urgent in lab CPAP titration study. I may have to have a conversation with your insurance on why this is required. I will keep you updated.  Follow up in 8 weeks with Dr. Wynona Neat or Philis Nettle. If symptoms do not improve or worsen, please contact office for sooner follow up

## 2023-02-24 NOTE — Progress Notes (Signed)
Patient ID: Whitney White, female     DOB: 04-Oct-1975, 47 y.o.      MRN: [id number]  No chief complaint on file.   Virtual Visit via Video Note  I connected with [name] on 02/24/23 at  3:30 PM EDT by a video enabled telemedicine application and verified that I am speaking with the correct person using two identifiers.  Location: Patient: Home Provider: Office   I discussed the limitations of evaluation and management by telemedicine and the availability of in person appointments. The patient expressed understanding and agreed to proceed.  History of Present Illness: 47 year old female, former smoker referred for sleep consult. Past medical history significant for HTN, GERD, depression, diastolic dysfunction, RLS, anxiety, obesity, prediabetes, HLD.   TEST/EVENTS:  01/20/2017 NPSG: AHI 3.5/h, SpO2 low 89%; weight 255 lb 12/19/2022 NPSG: AHI 153/h, SpO2 low 74%; weight 295 lb   12/04/2022: OV with [name] for sleep consult, referred by Dr. Laural Benes. She has been having trouble with her sleep for many years. She had a sleep study in 2018 which some periodic limb movement but no evidence of significant sleep disordered breathing. She feels like her symptoms have progressively worsened. She has been told by her bed partner that she constantly moves during the night. She tosses and turns often. She feels her sleep is restless. She wakes feeling tired. She has daytime sleepiness. She has been told she snores. She does talk in her sleep. She tells me she will have full conversations. She has slept walked years ago but nothing of recent. No witnessed apneas, drowsy driving, morning headaches, sleep paralysis. No history of narcolepsy or symptoms of cataplexy. She goes to bed around 9-11 pm. Falls asleep within 20 minutes. Takes 7-8 times a night. Gets up around 5:40-5:45 am. She currently takes 3 mg of melatonin nightly. She was on trazodone, prescribed by her psychiatrist, in the past but hasn't  taken this in quite some time. 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 She's gained 40 lb since her last sleep study. No oxygen use.  She has a history of HTN, angina, heart rhythm problems, DM. No history of stroke. She is followed by cardiology. She is a former smoker; quit 2015. Drinks alcohol socially. No excessive caffeine intake. She lives with her children. 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 Family history of allergies and cancer.  [place] 19  02/24/2023: Today - follow up Patient presents today for follow-up to discuss his sleep study results.  She had an in lab study 12/19/2022 with very severe obstructive sleep apnea.  AHI was 153/h.  She feels unchanged compared to her last visit.  Has significant trouble with daytime sleepiness.  Will doze off when she is sitting and not doing much.  Does not sleep well at all.  Tosses and turns frequently.  Talks in her sleep.  Has not slept walk in many years.  Denies any drowsy driving, morning headaches, sleep paralysis.  She did go over her results with her primary care provider who she was told put orders in for a CPAP machine.  This was a couple weeks ago.  She has not heard anything from medical supply company regarding this.  Allergies  Allergen Reactions   Lisinopril Cough   Metformin And Related     Caused palpitations/heart pounding   Immunization History  Administered Date(s) Administered   HPV 9-valent 01/29/2018, 08/03/2018, 10/05/2018, 01/05/2019   Influenza,inj,Quad PF,6+ Mos 03/02/2014, 03/10/2017, 08/03/2018, 03/27/2022  Influenza-Unspecified 04/03/2021   PFIZER(Purple Top)SARS-COV-2 Vaccination 11/15/2019, 12/08/2019   PPD Test 02/23/2021   Pneumococcal Polysaccharide-23 01/02/2018   Tdap 09/19/2011, 03/27/2022   Past Medical History:  Diagnosis Date   Depression    Diabetes mellitus without complication (HCC)    "pre-diabetic"   Eczema    GERD (gastroesophageal reflux disease)    Gestational  hypertension    Hypertension     Tobacco History: Social History   Tobacco Use  Smoking Status Former   Types: Cigarettes  Smokeless Tobacco Never   Counseling given: Not Answered   Outpatient Medications Prior to Visit  Medication Sig Dispense Refill   albuterol (VENTOLIN HFA) 108 (90 Base) MCG/ACT inhaler Inhale 1-2 puffs into the lungs every 6 (six) hours as needed for wheezing or shortness of breath. 8 g 0   amLODipine (NORVASC) 10 MG tablet Take 1 tablet (10 mg total) by mouth daily. 90 tablet 1   atorvastatin (LIPITOR) 40 MG tablet Take 1 tablet (40 mg total) by mouth daily. Dose increase 01/01/2023 90 tablet 1   Blood Glucose Monitoring Suppl (ACCU-CHEK GUIDE ME) w/Device KIT UAD 1 kit 0   cyclobenzaprine (FLEXERIL) 10 MG tablet Take 1 tablet (10 mg total) by mouth 2 (two) times daily as needed for muscle spasms. 20 tablet 0   empagliflozin (JARDIANCE) 25 MG TABS tablet Take 1 tablet (25 mg total) by mouth daily before breakfast. 90 tablet 1   escitalopram (LEXAPRO) 20 MG tablet Take 1 tablet (20 mg) by mouth daily. 30 tablet 6   glucose blood (ACCU-CHEK GUIDE) test strip Check BS once a day 100 each 12   hydrochlorothiazide (HYDRODIURIL) 25 MG tablet TAKE 1 TABLET (25 MG TOTAL) BY MOUTH DAILY. 90 tablet 1   hydrOXYzine (ATARAX/VISTARIL) 25 MG tablet Take 12.5 mg by mouth 2 (two) times daily as needed.     Lancets Misc. (ACCU-CHEK FASTCLIX LANCET) KIT UAD 100 kit 3   levonorgestrel (MIRENA) 20 MCG/24HR IUD 1 each by Intrauterine route once.     meloxicam (MOBIC) 15 MG tablet Take 1 tablet (15 mg total) by mouth daily. (Patient not taking: Reported on 02/19/2023) 30 tablet 1   predniSONE (DELTASONE) 20 MG tablet Take 2 tablets (40 mg total) by mouth daily with breakfast. (Patient not taking: Reported on 02/19/2023) 10 tablet 0   Semaglutide,0.25 or 0.5MG /DOS, 2 MG/3ML SOPN Inject 0.25 mg into the skin once a week. 3 mL 1   Spacer/Aero-Holding Chambers DEVI 1 Units by Does not apply  route in the morning, at noon, and at bedtime. 1 Units 0   triamcinolone (KENALOG) 0.025 % ointment APPLY TO AFFECTED AREA TWICE A DAY 60 g 0   TRUEplus Lancets 28G MISC Use as directed 100 each 5   No facility-administered medications prior to visit.     Review of Systems:   Constitutional: No night sweats, fevers, chills, or lassitude. +fatigue, weight gain HEENT: No headaches, difficulty swallowing, tooth/dental problems, or sore throat. No sneezing, itching, ear ache. +occasional nasal congestion CV:  No chest pain, orthopnea, PND, swelling in lower extremities, anasarca, dizziness, palpitations, syncope Resp: +snoring; shortness of breath with exertion (baseline). No excess mucus or change in color of mucus. No productive or non-productive. No hemoptysis. No wheezing.  No chest wall deformity GI:  +heartburn, indigestion. No abdominal pain, nausea, vomiting, diarrhea, change in bowel habits, loss of appetite, bloody stools.  GU: No dysuria, change in color of urine, urgency or frequency.  Skin: No rash, lesions, ulcerations MSK:  No joint pain or swelling.  Neuro: No dizziness or lightheadedness.  Psych: +stable depression, anxiety. Mood stable. +sleep disturbance  Observations/Objective: Patient is well-developed, well-nourished in no acute distress. A&Ox3. Resting comfortably at home. Unlabored breathing. Speech is clear and coherent with logical content.    Assessment and Plan: OSA (obstructive sleep apnea) Very severe OSA. She needs an urgent in lab titration study given severity and significant oxygen desaturations. Will place orders today. Appears that previous CPAP orders were entered incorrectly. Will re-order urgent new start CPAP 5-20 cmH2O, mask of choice and heated humidity for her to start utilizing while we wait on titration study. Educated on proper use/care of device. Risks/benefits reviewed. Healthy weight loss encouraged. Safe sleep and driving precautions  discussed.  Patient Instructions  Your sleep study revealed very severe sleep apnea. You will need to start CPAP therapy ASAP. I am going to send an updated order for the CPAP machine since you have not heard anything. They should contact you this week. Please call me if you haven't heard something by Friday.  Start CPAP 5-20 cmH2O, mask of choice and heated humidity, every night, minimum of 4-6 hours a night.  Change equipment as directed. Wash your tubing with warm soap and water daily, hang to dry. Wash humidifier portion weekly. Use bottled, distilled water and change daily  Be aware of reduced alertness and do not drive or operate heavy machinery if experiencing this or drowsiness.  Exercise encouraged, as tolerated. Healthy weight management discussed.  Notify if persistent daytime sleepiness occurs even with consistent use of CPAP.  Change supplies... Every month Mask cushions and/or nasal pillows CPAP machine filters Every 3 months Mask frame (not including the headgear) CPAP tubing Every 6 months Mask headgear Chin strap (if applicable) Humidifier water tub  I have ordered an urgent in lab CPAP titration study. I may have to have a conversation with your insurance on why this is required. I will keep you updated.  Follow up in 8 weeks with Dr. Wynona Neat or Philis Nettle. If symptoms do not improve or worsen, please contact office for sooner follow up    Class 3 severe obesity with body mass index (BMI) of 50.0 to 59.9 in adult [facility]) Healthy weight loss encouraged    I discussed the assessment and treatment plan with the patient. The patient was provided an opportunity to ask questions and all were answered. The patient agreed with the plan and demonstrated an understanding of the instructions.   The patient was advised to call back or seek an in-person evaluation if the symptoms worsen or if the condition fails to improve as anticipated.  I provided 31 minutes of  non-face-to-face time during this encounter.   Noemi Chapel, NP

## 2023-02-26 DIAGNOSIS — F411 Generalized anxiety disorder: Secondary | ICD-10-CM | POA: Diagnosis not present

## 2023-03-05 ENCOUNTER — Encounter: Payer: Self-pay | Admitting: Internal Medicine

## 2023-03-05 DIAGNOSIS — Z62820 Parent-biological child conflict: Secondary | ICD-10-CM | POA: Diagnosis not present

## 2023-03-05 DIAGNOSIS — F411 Generalized anxiety disorder: Secondary | ICD-10-CM | POA: Diagnosis not present

## 2023-03-05 DIAGNOSIS — Z630 Problems in relationship with spouse or partner: Secondary | ICD-10-CM | POA: Diagnosis not present

## 2023-03-12 DIAGNOSIS — F411 Generalized anxiety disorder: Secondary | ICD-10-CM | POA: Diagnosis not present

## 2023-03-19 ENCOUNTER — Encounter: Payer: Self-pay | Admitting: Internal Medicine

## 2023-03-19 DIAGNOSIS — F411 Generalized anxiety disorder: Secondary | ICD-10-CM | POA: Diagnosis not present

## 2023-03-20 ENCOUNTER — Other Ambulatory Visit: Payer: Self-pay

## 2023-03-26 ENCOUNTER — Encounter (HOSPITAL_BASED_OUTPATIENT_CLINIC_OR_DEPARTMENT_OTHER): Payer: Medicaid Other | Admitting: Internal Medicine

## 2023-03-26 DIAGNOSIS — R4581 Low self-esteem: Secondary | ICD-10-CM | POA: Diagnosis not present

## 2023-03-26 DIAGNOSIS — Z7985 Long-term (current) use of injectable non-insulin antidiabetic drugs: Secondary | ICD-10-CM

## 2023-03-26 DIAGNOSIS — F411 Generalized anxiety disorder: Secondary | ICD-10-CM | POA: Diagnosis not present

## 2023-03-26 DIAGNOSIS — E1169 Type 2 diabetes mellitus with other specified complication: Secondary | ICD-10-CM | POA: Diagnosis not present

## 2023-03-26 MED ORDER — SEMAGLUTIDE(0.25 OR 0.5MG/DOS) 2 MG/3ML ~~LOC~~ SOPN
0.5000 mg | PEN_INJECTOR | SUBCUTANEOUS | 1 refills | Status: DC
Start: 2023-03-26 — End: 2023-10-01

## 2023-03-26 NOTE — Telephone Encounter (Signed)

## 2023-03-31 DIAGNOSIS — G4733 Obstructive sleep apnea (adult) (pediatric): Secondary | ICD-10-CM | POA: Diagnosis not present

## 2023-04-01 ENCOUNTER — Ambulatory Visit: Payer: Medicaid Other | Admitting: Physical Therapy

## 2023-04-02 DIAGNOSIS — Z5986 Financial insecurity: Secondary | ICD-10-CM | POA: Diagnosis not present

## 2023-04-02 DIAGNOSIS — F411 Generalized anxiety disorder: Secondary | ICD-10-CM | POA: Diagnosis not present

## 2023-04-09 DIAGNOSIS — R4581 Low self-esteem: Secondary | ICD-10-CM | POA: Diagnosis not present

## 2023-04-09 DIAGNOSIS — G4730 Sleep apnea, unspecified: Secondary | ICD-10-CM | POA: Diagnosis not present

## 2023-04-09 DIAGNOSIS — F411 Generalized anxiety disorder: Secondary | ICD-10-CM | POA: Diagnosis not present

## 2023-04-09 DIAGNOSIS — Z5986 Financial insecurity: Secondary | ICD-10-CM | POA: Diagnosis not present

## 2023-04-12 ENCOUNTER — Encounter: Payer: Self-pay | Admitting: Physical Therapy

## 2023-04-12 ENCOUNTER — Ambulatory Visit: Payer: Medicaid Other | Attending: Physician Assistant | Admitting: Physical Therapy

## 2023-04-12 ENCOUNTER — Other Ambulatory Visit: Payer: Self-pay

## 2023-04-12 DIAGNOSIS — M25511 Pain in right shoulder: Secondary | ICD-10-CM | POA: Diagnosis not present

## 2023-04-12 DIAGNOSIS — M6281 Muscle weakness (generalized): Secondary | ICD-10-CM | POA: Insufficient documentation

## 2023-04-12 NOTE — Therapy (Signed)
OUTPATIENT PHYSICAL THERAPY SHOULDER EVALUATION   Patient Name: Whitney White MRN: [id number] DOB:1975-12-30, 47 y.o., female Today's Date: 04/12/2023   PT End of Session - 04/12/23 1155     Visit Number 1    Number of Visits --   1-2x/week   Date for PT Re-Evaluation 06/07/23    Authorization Type [employer] - FOTO    PT Start Time 1115    PT Stop Time 1150    PT Time Calculation (min) 35 min             Past Medical History:  Diagnosis Date   Depression    Diabetes mellitus without complication (HCC)    "pre-diabetic"   Eczema    GERD (gastroesophageal reflux disease)    Gestational hypertension    Hypertension    Past Surgical History:  Procedure Laterality Date   NO PAST SURGERIES     Patient Active Problem List   Diagnosis Date Noted   Pain in right shoulder 01/03/2023   OSA (obstructive sleep apnea) 12/30/2022   Excessive daytime sleepiness 12/04/2022   Screening for cervical cancer 06/06/2022   Screening examination for STD (sexually transmitted disease) 06/06/2022   Vaginal bleeding 06/06/2022   Periodic limb movement 03/10/2017   RLS (restless legs syndrome) 01/26/2017   Mild diastolic dysfunction 12/06/2016   Prediabetes 11/26/2016   Hyperlipidemia 11/26/2016   Anxiety state 12/22/2013   Class 3 severe obesity with body mass index (BMI) of 50.0 to 59.9 in adult (HCC) 12/22/2013   Chest pain 12/17/2013   Depression    Hypertension    Eczema    Gestational hypertension    GERD (gastroesophageal reflux disease)     PCP: Marcine Matar, MD  REFERRING PROVIDER: Persons, [place], [place]  THERAPY DIAG:  Right shoulder pain, unspecified chronicity  Muscle weakness  REFERRING DIAG: Acute pain of right shoulder [M25.511]   Rationale for Evaluation and Treatment:  Rehabilitation  SUBJECTIVE:  PERTINENT PAST HISTORY:  HTN, anxiety, prediabetes, SOB         PRECAUTIONS: None  WEIGHT BEARING RESTRICTIONS No  FALLS:  Has patient  fallen in last 6 months? No, Number of falls: 0  MOI/History of condition:  Onset date: 3-4 months  SUBJECTIVE STATEMENT  Whitney White is a 47 y.o. female who presents to clinic with chief complaint of R shoulder pain.  No known injury or trauma.  Slow onset with worsening over time.  She feels like it may be her R/C.  It hurts with shoulder movement.  She received an injection which helped the pain but did not alleviate all sxs.  Denies radiating pain.   Red flags:  denies  Pain:  Are you having pain? Yes Pain location: R shoulder NPRS scale:  0/10 to 6/10 Aggravating factors: abd, rotation, forward flexion Relieving factors: rest Pain description: intermittent Stage: Chronic 24 hour pattern: worse with activity   Occupation: computer work  Education administrator: na  Higher education careers adviser Dominance: L  Patient Goals/Specific Activities: reduce pain   OBJECTIVE:    DIAGNOSTIC FINDINGS:  X-ray clear  White OBSERVATION:  Rounded shoulder posture     SENSATION:  Light touch: Appears intact   PALPATION: TTP R UT   UPPER EXTREMITY AROM:  ROM Right (Eval) Left (Eval)  Shoulder flexion 160 160  Shoulder abduction 160 160  Shoulder internal rotation    Shoulder external rotation    Functional IR L4* L4  Functional ER T2* T2  Shoulder extension  Elbow extension    Elbow flexion     (Blank rows = not tested, N = WNL, * = concordant pain with testing)  UPPER EXTREMITY MMT:  MMT Right (Eval) Left (Eval)  Shoulder flexion 4+ 4+  Shoulder abduction (C5) 4* 4+  Shoulder ER 3+* 4+  Shoulder IR 4* 4+  Middle trapezius    Lower trapezius    Shoulder extension    Grip strength    Cervical flexion (C1,C2)    Cervical S/B (C3)    Shoulder shrug (C4)    Elbow flexion (C6)    Elbow ext (C7)    Thumb ext (C8)    Finger abd (T1)    Grossly     (Blank rows = not tested, score listed is out of 5 possible points.  N = WNL, D = diminished, C = clear for gross weakness with  myotome testing, * = concordant pain with testing)   UPPER EXTREMITY PROM:  PROM Right (Eval) Left (Eval)  Shoulder flexion    Shoulder abduction    Shoulder internal rotation    Shoulder external rotation    Functional IR    Functional ER    Shoulder extension    Elbow extension    Elbow flexion     (Blank rows = not tested, N = WNL, * = concordant pain with testing)  SPECIAL TESTS:  R/C test cluster (+)  JOINT MOBILITY TESTING:  NT  PATIENT SURVEYS:  FOTO 66 ->67    TODAY'S TREATMENT:  Therapeutic Exercise: Creating, reviewing, and completing below HEP   PATIENT EDUCATION:  POC, diagnosis, prognosis, HEP, and outcome measures.  Pt educated via explanation, demonstration, and handout (HEP).  Pt confirms understanding verbally.   HOME EXERCISE PROGRAM: Access Code: [email or web address] URL: [email or web address] Date: 04/12/2023 Prepared by: Alphonzo Severance  Exercises - Doorway Pec Stretch at 90 Degrees Abduction  - 1 x daily - 7 x weekly - 3 reps - 45 hold - Sidelying Thoracic Rotation with Open Book  - 1 x daily - 7 x weekly - 1 sets - 20 reps - 2 hold - Shoulder External Rotation Reactive Isometrics  - 1 x daily - 7 x weekly - 2 sets - 10 reps  Treatment priorities   Eval        Pec major and posture        Thoracic spine mobility        Progressive R/C                          ASSESSMENT:  CLINICAL IMPRESSION: Whitney White is a 47 y.o. female who presents to clinic with signs and sxs consistent with R shoulder pain. Consistent with SAPS.  No sign of cervical origin.  Pt will benefit from skilled PT to address relevant deficits and improve shoulder function   OBJECTIVE IMPAIRMENTS: Pain, R shoulder strength  ACTIVITY LIMITATIONS: work, sleep, reaching, lifting  PERSONAL FACTORS: See medical history and pertinent history   REHAB POTENTIAL: Good  CLINICAL DECISION MAKING: Stable/uncomplicated  EVALUATION COMPLEXITY: Low   GOALS:   SHORT  TERM GOALS: Target date: 05/10/2023   Whitney White will be >75% HEP compliant to improve carryover between sessions and facilitate independent management of condition  Evaluation: ongoing Goal status: INITIAL   LONG TERM GOALS: Target date: 06/07/2023   Whitney White will improve FOTO score to 67 as a proxy for functional improvement  Evaluation/Baseline: 66 Goal status: INITIAL    2.  Whitney White  will self report >/= 50% decrease in pain from evaluation to improve function in daily tasks  Evaluation/Baseline: 6/10 max pain Goal status: INITIAL   3.  Whitney White will be able to complete daily and work tasks, not limited by pain  Evaluation/Baseline: limited Goal status: INITIAL   4.  Whitney White will improve the following MMTs to >/= 4+/5 to show improvement in strength:     Evaluation/Baseline:   UPPER EXTREMITY MMT:  MMT Right (Eval) Left (Eval)  Shoulder flexion 4+ 4+  Shoulder abduction (C5) 4* 4+  Shoulder ER 3+* 4+  Shoulder IR 4* 4+  Middle trapezius    Lower trapezius    Shoulder extension    Grip strength    Cervical flexion (C1,C2)    Cervical S/B (C3)    Shoulder shrug (C4)    Elbow flexion (C6)    Elbow ext (C7)    Thumb ext (C8)    Finger abd (T1)    Grossly     (Blank rows = not tested, score listed is out of 5 possible points.  N = WNL, D = diminished, C = clear for gross weakness with myotome testing, * = concordant pain with testing)  Goal status: INITIAL   5.  Whitney White will report confidence in self management of condition at time of discharge with advanced HEP  Evaluation/Baseline: unable to self manage Goal status: INITIAL    PLAN: PT FREQUENCY: 1-2x/week  PT DURATION: 8 weeks  PLANNED INTERVENTIONS:  97164- PT Re-evaluation, 97110-Therapeutic exercises, 97530- Therapeutic activity, [id number]- Neuromuscular re-education, 97535- Self Care, 16109- Manual therapy, [id number]- Gait training, [id number]- Aquatic Therapy, [id number]- Electrical stimulation (manual), [id number]-  Vasopneumatic device, [id number]- Traction (mechanical), [id number]- Ionotophoresis 4mg /ml Dexamethasone, Taping, Dry Needling, Joint manipulation, and Spinal manipulation.   Alphonzo Severance PT, DPT 04/12/2023, 11:56 AM

## 2023-04-16 DIAGNOSIS — F411 Generalized anxiety disorder: Secondary | ICD-10-CM | POA: Diagnosis not present

## 2023-04-16 DIAGNOSIS — Z62820 Parent-biological child conflict: Secondary | ICD-10-CM | POA: Diagnosis not present

## 2023-04-26 ENCOUNTER — Telehealth: Payer: Self-pay | Admitting: Physical Therapy

## 2023-04-26 ENCOUNTER — Ambulatory Visit: Payer: Medicaid Other | Admitting: Physical Therapy

## 2023-04-26 NOTE — Telephone Encounter (Signed)
Called and informed patient of missed visit and provided reminder of next appt and attendance policy via VM. 

## 2023-04-30 DIAGNOSIS — F411 Generalized anxiety disorder: Secondary | ICD-10-CM | POA: Diagnosis not present

## 2023-05-01 DIAGNOSIS — G4733 Obstructive sleep apnea (adult) (pediatric): Secondary | ICD-10-CM | POA: Diagnosis not present

## 2023-05-03 ENCOUNTER — Ambulatory Visit: Payer: Medicaid Other | Admitting: Physical Therapy

## 2023-05-03 ENCOUNTER — Telehealth: Payer: Self-pay | Admitting: Physical Therapy

## 2023-05-03 NOTE — Telephone Encounter (Signed)
Called and informed patient of missed visit and provided reminder of next appt and attendance policy.  

## 2023-05-07 DIAGNOSIS — Y93E6 Activity, residential relocation: Secondary | ICD-10-CM | POA: Diagnosis not present

## 2023-05-07 DIAGNOSIS — F411 Generalized anxiety disorder: Secondary | ICD-10-CM | POA: Diagnosis not present

## 2023-05-09 ENCOUNTER — Ambulatory Visit: Payer: Medicaid Other | Admitting: Internal Medicine

## 2023-05-22 ENCOUNTER — Encounter (HOSPITAL_BASED_OUTPATIENT_CLINIC_OR_DEPARTMENT_OTHER): Payer: Medicaid Other | Admitting: Internal Medicine

## 2023-05-27 DIAGNOSIS — Z600 Problems of adjustment to life-cycle transitions: Secondary | ICD-10-CM | POA: Diagnosis not present

## 2023-05-27 DIAGNOSIS — F411 Generalized anxiety disorder: Secondary | ICD-10-CM | POA: Diagnosis not present

## 2023-05-31 ENCOUNTER — Encounter (HOSPITAL_COMMUNITY): Payer: Self-pay

## 2023-05-31 ENCOUNTER — Ambulatory Visit (HOSPITAL_COMMUNITY)
Admission: RE | Admit: 2023-05-31 | Discharge: 2023-05-31 | Disposition: A | Discharge status: Home / Self Care | Payer: Medicaid Other | Source: Ambulatory Visit | Attending: Emergency Medicine | Admitting: Emergency Medicine

## 2023-05-31 VITALS — BP 108/76 | HR 97 | Temp 98.6°F | Resp 16

## 2023-05-31 DIAGNOSIS — J0100 Acute maxillary sinusitis, unspecified: Secondary | ICD-10-CM

## 2023-05-31 DIAGNOSIS — R051 Acute cough: Secondary | ICD-10-CM | POA: Diagnosis not present

## 2023-05-31 DIAGNOSIS — G4733 Obstructive sleep apnea (adult) (pediatric): Secondary | ICD-10-CM | POA: Diagnosis not present

## 2023-05-31 LAB — POC COVID19/FLU A&B COMBO
Covid Antigen, POC: NEGATIVE
Influenza A Antigen, POC: NEGATIVE
Influenza B Antigen, POC: NEGATIVE

## 2023-05-31 MED ORDER — AMOXICILLIN-POT CLAVULANATE 875-125 MG PO TABS: 1.0000 | ORAL_TABLET | Freq: Two times a day (BID) | ORAL | 0 refills | Status: DC

## 2023-05-31 MED ORDER — BENZONATATE 100 MG PO CAPS: 100.0000 mg | ORAL_CAPSULE | Freq: Three times a day (TID) | ORAL | 0 refills | Status: DC

## 2023-05-31 MED ORDER — PREDNISONE 20 MG PO TABS: 20.0000 mg | ORAL_TABLET | Freq: Every day | ORAL | 0 refills | Status: DC

## 2023-05-31 NOTE — ED Provider Notes (Signed)
[facility]    CSN: [id number] Arrival date & time: 05/31/23  1154      History   Chief Complaint Chief Complaint  Patient presents with   Cough    I am not able to bring up loose mucus. It's painful when i cough and I can't sleep at night because of the coughing - Entered by patient    HPI Whitney White is a 47 y.o. female.   Patient reporting cold x 1 week.  It has worsened over the last couple of days with increased shortness of breath, thick tenacious mucus having difficulty bringing it up, intermittent fevers at home with chills and bodyaches.  The history is provided by the patient.  Cough Cough characteristics:  Harsh and productive Sputum characteristics:  Yellow Severity:  Moderate Onset quality:  Gradual Duration:  1 week Timing:  Constant Progression:  Worsening Associated symptoms: chills, ear pain and sore throat     Past Medical History:  Diagnosis Date   Depression    Diabetes mellitus without complication (HCC)    "pre-diabetic"   Eczema    GERD (gastroesophageal reflux disease)    Gestational hypertension    Hypertension     Patient Active Problem List   Diagnosis Date Noted   Pain in right shoulder 01/03/2023   OSA (obstructive sleep apnea) 12/30/2022   Excessive daytime sleepiness 12/04/2022   Screening for cervical cancer 06/06/2022   Screening examination for STD (sexually transmitted disease) 06/06/2022   Vaginal bleeding 06/06/2022   Periodic limb movement 03/10/2017   RLS (restless legs syndrome) 01/26/2017   Mild diastolic dysfunction 12/06/2016   Prediabetes 11/26/2016   Hyperlipidemia 11/26/2016   Anxiety state 12/22/2013   Class 3 severe obesity with body mass index (BMI) of 50.0 to 59.9 in adult [facility]) 12/22/2013   Chest pain 12/17/2013   Depression    Hypertension    Eczema    Gestational hypertension    GERD (gastroesophageal reflux disease)     Past Surgical History:  Procedure Laterality Date   NO PAST  SURGERIES      OB History     Gravida  4   Para  4   Term  4   Preterm  0   AB  0   Living  4      SAB  0   IAB  0   Ectopic  0   Multiple  0   Live Births  1            Home Medications    Prior to Admission medications   Medication Sig Start Date End Date Taking? Authorizing Provider  albuterol (VENTOLIN HFA) 108 (90 Base) MCG/ACT inhaler Inhale 1-2 puffs into the lungs every 6 (six) hours as needed for wheezing or shortness of breath. 09/28/22  Yes Eleonore Chiquito, FNP  amLODipine (NORVASC) 10 MG tablet Take 1 tablet (10 mg total) by mouth daily. 12/30/22  Yes Marcine Matar, MD  amoxicillin-clavulanate (AUGMENTIN) 875-125 MG tablet Take 1 tablet by mouth every 12 (twelve) hours. 05/31/23  Yes Zalma Channing, Linde Gillis, NP  atorvastatin (LIPITOR) 40 MG tablet Take 1 tablet (40 mg total) by mouth daily. Dose increase 01/01/2023 01/01/23  Yes Marcine Matar, MD  benzonatate (TESSALON) 100 MG capsule Take 1 capsule (100 mg total) by mouth every 8 (eight) hours. 05/31/23  Yes Sigismund Cross, Linde Gillis, NP  Blood Glucose Monitoring Suppl (ACCU-CHEK GUIDE ME) w/Device KIT UAD 06/20/22  Yes Marcine Matar, MD  cyclobenzaprine (FLEXERIL) 10 MG tablet Take 1 tablet (10 mg total) by mouth 2 (two) times daily as needed for muscle spasms. 12/24/22  Yes Bing Neighbors, NP  escitalopram (LEXAPRO) 20 MG tablet Take 1 tablet (20 mg) by mouth daily. 02/14/23  Yes Marcine Matar, MD  glucose blood (ACCU-CHEK GUIDE) test strip Check BS once a day 06/20/22  Yes Marcine Matar, MD  hydrochlorothiazide (HYDRODIURIL) 25 MG tablet TAKE 1 TABLET (25 MG TOTAL) BY MOUTH DAILY. 01/20/23  Yes Marcine Matar, MD  hydrOXYzine (ATARAX/VISTARIL) 25 MG tablet Take 12.5 mg by mouth 2 (two) times daily as needed. 10/02/20  Yes [provider]  Lancets Misc. (ACCU-CHEK FASTCLIX LANCET) KIT UAD 06/20/22  Yes Marcine Matar, MD  levonorgestrel [facility]) 20 MCG/24HR IUD 1 each by  Intrauterine route once.   Yes [provider]  meloxicam (MOBIC) 15 MG tablet Take 1 tablet (15 mg total) by mouth daily. 07/12/22  Yes Hudnall, Azucena Fallen, MD  predniSONE (DELTASONE) 20 MG tablet Take 1 tablet (20 mg total) by mouth daily with breakfast. 05/31/23  Yes Kayshaun Polanco, Linde Gillis, NP  Semaglutide,0.25 or 0.5MG /DOS, 2 MG/3ML SOPN Inject 0.5 mg into the skin once a week. 03/26/23  Yes Marcine Matar, MD  Spacer/Aero-Holding Chambers DEVI 1 Units by Does not apply route in the morning, at noon, and at bedtime. 06/20/22  Yes Ellsworth Lennox, PA-C  triamcinolone (KENALOG) 0.025 % ointment APPLY TO AFFECTED AREA TWICE A DAY 11/08/22  Yes Marcine Matar, MD  TRUEplus Lancets 28G MISC Use as directed 12/27/19  Yes Rema Fendt, NP  empagliflozin (JARDIANCE) 25 MG TABS tablet Take 1 tablet (25 mg total) by mouth daily before breakfast. 12/30/22   Marcine Matar, MD    Family History Family History  Problem Relation Age of Onset   Hypertension Mother    Stroke Mother    Diabetes Maternal Grandmother    Hypertension Maternal Grandmother    Cancer Maternal Aunt        breast cancer    Anesthesia problems Neg Hx     Social History Social History   Tobacco Use   Smoking status: Former    Types: Cigarettes   Smokeless tobacco: Never  Vaping Use   Vaping status: Never Used  Substance Use Topics   Alcohol use: Yes    Comment: occaisonally   Drug use: No     Allergies   Lisinopril and Metformin and related   Review of Systems Review of Systems  Constitutional:  Positive for chills.  HENT:  Positive for ear pain, postnasal drip, sinus pressure and sore throat.   Respiratory:  Positive for cough.   Musculoskeletal:  Positive for arthralgias.     Physical Exam Triage Vital Signs ED Triage Vitals  Encounter Vitals Group     BP 05/31/23 1234 108/76     Systolic BP Percentile --      Diastolic BP Percentile --      Pulse Rate 05/31/23 1234 97     Resp 05/31/23  1234 16     Temp 05/31/23 1234 98.6 F (37 C)     Temp Source 05/31/23 1234 Oral     SpO2 05/31/23 1234 93 %     Weight --      Height --      Head Circumference --      Peak Flow --      Pain Score 05/31/23 1242 3     Pain  Loc --      Pain Education --      Exclude from Growth Chart --    No data found.  Updated Vital Signs BP 108/76 (BP Location: Left Arm)   Pulse 97   Temp 98.6 F (37 C) (Oral)   Resp 16   SpO2 93%   Visual Acuity Right Eye Distance:   Left Eye Distance:   Bilateral Distance:    Right Eye Near:   Left Eye Near:    Bilateral Near:     Physical Exam Vitals reviewed.  HENT:     Right Ear: A middle ear effusion is present.     Left Ear: A middle ear effusion is present. Tympanic membrane is erythematous.     Nose: Congestion and rhinorrhea present. Rhinorrhea is purulent.     Right Turbinates: Enlarged.     Left Turbinates: Enlarged.     Right Sinus: Maxillary sinus tenderness present.     Left Sinus: Maxillary sinus tenderness present.  Pulmonary:     Effort: Pulmonary effort is normal.     Breath sounds: Normal breath sounds and air entry.  Neurological:     Mental Status: She is alert.      UC Treatments / Results  Labs (all labs ordered are listed, but only abnormal results are displayed) Labs Reviewed  POC COVID19/FLU A&B COMBO    EKG   Radiology No results found.  Procedures Procedures (including critical care time)  Medications Ordered in UC Medications - No data to display  Initial Impression / Assessment and Plan / UC Course  I have reviewed the triage vital signs and the nursing notes.  Pertinent labs & imaging results that were available during my care of the patient were reviewed by me and considered in my medical decision making (see chart for details).   Patient has symptoms consistent of a sinus infection most likely from recent upper respiratory virus.  She also has noted effusions bilateral ears.  She denies  any ear pain at this time we will treat with antibiotics and recommend over-the-counter medications such as Mucinex and low-dose pseudoephedrine to help with the mucus.   Final Clinical Impressions(s) / UC Diagnoses   Final diagnoses:  Acute non-recurrent maxillary sinusitis  Acute cough     Discharge Instructions      Take medications as ordered. Over the counter medications  Pseudoephedrine    Mucinex 600mg   1 every 12 hrs Motrin 600mg  or Tylenol 1000mg  three times a day for generalized body aches and fever Ensure hydration Consider adding on Zinc and Vit C for immunity  Salt water gargles    ED Prescriptions     Medication Sig Dispense Auth. Provider   amoxicillin-clavulanate (AUGMENTIN) 875-125 MG tablet Take 1 tablet by mouth every 12 (twelve) hours. 14 tablet Luis Sami, Linde Gillis, NP   predniSONE (DELTASONE) 20 MG tablet Take 1 tablet (20 mg total) by mouth daily with breakfast. 5 tablet Cherae Marton, Linde Gillis, NP   benzonatate (TESSALON) 100 MG capsule Take 1 capsule (100 mg total) by mouth every 8 (eight) hours. 21 capsule Promyse Ardito, Linde Gillis, NP      PDMP not reviewed this encounter.   Nelda Marseille, NP 06/21/23 1023

## 2023-05-31 NOTE — Discharge Instructions (Addendum)
Take medications as ordered. Over the counter medications  Pseudoephedrine    Mucinex 600mg   1 every 12 hrs Motrin 600mg  or Tylenol 1000mg  three times a day for generalized body aches and fever Ensure hydration Consider adding on Zinc and Vit C for immunity  Salt water gargles

## 2023-05-31 NOTE — ED Triage Notes (Signed)
Pt presents with a productive cough and congestion x 3 days. Pt reports she is having a hard time coughing up phlegm. Pt has been taking Mucinex and Nyquil.

## 2023-06-05 DIAGNOSIS — Z630 Problems in relationship with spouse or partner: Secondary | ICD-10-CM | POA: Diagnosis not present

## 2023-06-05 DIAGNOSIS — F411 Generalized anxiety disorder: Secondary | ICD-10-CM | POA: Diagnosis not present

## 2023-06-05 DIAGNOSIS — Z5986 Financial insecurity: Secondary | ICD-10-CM | POA: Diagnosis not present

## 2023-06-12 ENCOUNTER — Other Ambulatory Visit: Payer: Self-pay

## 2023-06-15 ENCOUNTER — Encounter (HOSPITAL_BASED_OUTPATIENT_CLINIC_OR_DEPARTMENT_OTHER): Payer: Medicaid Other | Admitting: Internal Medicine

## 2023-06-17 ENCOUNTER — Other Ambulatory Visit: Payer: Self-pay

## 2023-06-20 DIAGNOSIS — Z5986 Financial insecurity: Secondary | ICD-10-CM | POA: Diagnosis not present

## 2023-06-20 DIAGNOSIS — F411 Generalized anxiety disorder: Secondary | ICD-10-CM | POA: Diagnosis not present

## 2023-06-20 DIAGNOSIS — Z630 Problems in relationship with spouse or partner: Secondary | ICD-10-CM | POA: Diagnosis not present

## 2023-06-28 DIAGNOSIS — F411 Generalized anxiety disorder: Secondary | ICD-10-CM | POA: Diagnosis not present

## 2023-06-28 DIAGNOSIS — Z5986 Financial insecurity: Secondary | ICD-10-CM | POA: Diagnosis not present

## 2023-06-28 DIAGNOSIS — Z630 Problems in relationship with spouse or partner: Secondary | ICD-10-CM | POA: Diagnosis not present

## 2023-07-01 DIAGNOSIS — G4733 Obstructive sleep apnea (adult) (pediatric): Secondary | ICD-10-CM | POA: Diagnosis not present

## 2023-07-05 DIAGNOSIS — F411 Generalized anxiety disorder: Secondary | ICD-10-CM | POA: Diagnosis not present

## 2023-07-05 DIAGNOSIS — T7631XD Adult psychological abuse, suspected, subsequent encounter: Secondary | ICD-10-CM | POA: Diagnosis not present

## 2023-07-07 ENCOUNTER — Ambulatory Visit: Payer: Medicaid Other | Admitting: Internal Medicine

## 2023-07-08 DIAGNOSIS — T74A1XD Adult financial abuse, confirmed, subsequent encounter: Secondary | ICD-10-CM | POA: Diagnosis not present

## 2023-07-08 DIAGNOSIS — F411 Generalized anxiety disorder: Secondary | ICD-10-CM | POA: Diagnosis not present

## 2023-07-08 DIAGNOSIS — Z630 Problems in relationship with spouse or partner: Secondary | ICD-10-CM | POA: Diagnosis not present

## 2023-07-18 DIAGNOSIS — F411 Generalized anxiety disorder: Secondary | ICD-10-CM | POA: Diagnosis not present

## 2023-07-18 DIAGNOSIS — Z630 Problems in relationship with spouse or partner: Secondary | ICD-10-CM | POA: Diagnosis not present

## 2023-07-20 ENCOUNTER — Telehealth: Payer: Medicaid Other | Admitting: Family Medicine

## 2023-07-20 DIAGNOSIS — J111 Influenza due to unidentified influenza virus with other respiratory manifestations: Secondary | ICD-10-CM | POA: Diagnosis not present

## 2023-07-20 MED ORDER — OSELTAMIVIR PHOSPHATE 75 MG PO CAPS: 75.0000 mg | ORAL_CAPSULE | Freq: Two times a day (BID) | ORAL | 0 refills | Status: AC

## 2023-07-20 MED ORDER — PROMETHAZINE-DM 6.25-15 MG/5ML PO SYRP
5.0000 mL | ORAL_SOLUTION | Freq: Four times a day (QID) | ORAL | 0 refills | Status: AC | PRN
Start: 2023-07-20 — End: 2023-07-30

## 2023-07-20 NOTE — Patient Instructions (Signed)

## 2023-07-20 NOTE — Progress Notes (Signed)
 Virtual Visit Consent   Whitney White, you are scheduled for a virtual visit with a [facility] provider today. Just as with appointments in the office, your consent must be obtained to participate. Your consent will be active for this visit and any virtual visit you may have with one of our providers in the next 365 days. If you have a MyChart account, a copy of this consent can be sent to you electronically.  As this is a virtual visit, video technology does not allow for your provider to perform a traditional examination. This may limit your provider's ability to fully assess your condition. If your provider identifies any concerns that need to be evaluated in person or the need to arrange testing (such as labs, EKG, etc.), we will make arrangements to do so. Although advances in technology are sophisticated, we cannot ensure that it will always work on either your end or our end. If the connection with a video visit is poor, the visit may have to be switched to a telephone visit. With either a video or telephone visit, we are not always able to ensure that we have a secure connection.  By engaging in this virtual visit, you consent to the provision of healthcare and authorize for your insurance to be billed (if applicable) for the services provided during this visit. Depending on your insurance coverage, you may receive a charge related to this service.  I need to obtain your verbal consent now. Are you willing to proceed with your visit today? Whitney White has provided verbal consent on 07/20/2023 for a virtual visit (video or telephone). Whitney Curio, FNP  Date: 07/20/2023 3:15 PM   Virtual Visit via Video Note   I, Whitney White, connected with  Whitney White  ([id number], [birth year]) on 07/20/23 at  3:00 PM EST by a video-enabled telemedicine application and verified that I am speaking with the correct person using two identifiers.  Location: Patient: Virtual Visit Location Patient:  Home Provider: Virtual Visit Location Provider: Home Office   I discussed the limitations of evaluation and management by telemedicine and the availability of in person appointments. The patient expressed understanding and agreed to proceed.    History of Present Illness: Whitney White is a 48 y.o. who identifies as a female who was assigned female at birth, and is being seen today for 2 days of sx. With cough, head congestion, sneezing, body aches and chills. Also n.  HPI: HPI  Problems:  Patient Active Problem List   Diagnosis Date Noted   Pain in right shoulder 01/03/2023   OSA (obstructive sleep apnea) 12/30/2022   Excessive daytime sleepiness 12/04/2022   Screening for cervical cancer 06/06/2022   Screening examination for STD (sexually transmitted disease) 06/06/2022   Vaginal bleeding 06/06/2022   Periodic limb movement 03/10/2017   RLS (restless legs syndrome) 01/26/2017   Mild diastolic dysfunction 12/06/2016   Prediabetes 11/26/2016   Hyperlipidemia 11/26/2016   Anxiety state 12/22/2013   Class 3 severe obesity with body mass index (BMI) of 50.0 to 59.9 in adult [facility]) 12/22/2013   Chest pain 12/17/2013   Depression    Hypertension    Eczema    Gestational hypertension    GERD (gastroesophageal reflux disease)     Allergies:  Allergies  Allergen Reactions   Lisinopril Cough   Metformin And Related     Caused palpitations/heart pounding   Medications:  Current Outpatient Medications:    oseltamivir (TAMIFLU) 75 MG capsule, Take  1 capsule (75 mg total) by mouth 2 (two) times daily for 5 days., Disp: 10 capsule, Rfl: 0   promethazine-dextromethorphan (PROMETHAZINE-DM) 6.25-15 MG/5ML syrup, Take 5 mLs by mouth 4 (four) times daily as needed for up to 10 days for cough., Disp: 118 mL, Rfl: 0   albuterol (VENTOLIN HFA) 108 (90 Base) MCG/ACT inhaler, Inhale 1-2 puffs into the lungs every 6 (six) hours as needed for wheezing or shortness of breath., Disp: 8 g, Rfl: 0    amLODipine (NORVASC) 10 MG tablet, Take 1 tablet (10 mg total) by mouth daily., Disp: 90 tablet, Rfl: 1   amoxicillin-clavulanate (AUGMENTIN) 875-125 MG tablet, Take 1 tablet by mouth every 12 (twelve) hours., Disp: 14 tablet, Rfl: 0   atorvastatin (LIPITOR) 40 MG tablet, Take 1 tablet (40 mg total) by mouth daily. Dose increase 01/01/2023, Disp: 90 tablet, Rfl: 1   benzonatate (TESSALON) 100 MG capsule, Take 1 capsule (100 mg total) by mouth every 8 (eight) hours., Disp: 21 capsule, Rfl: 0   Blood Glucose Monitoring Suppl (ACCU-CHEK GUIDE ME) w/Device KIT, UAD, Disp: 1 kit, Rfl: 0   cyclobenzaprine (FLEXERIL) 10 MG tablet, Take 1 tablet (10 mg total) by mouth 2 (two) times daily as needed for muscle spasms., Disp: 20 tablet, Rfl: 0   empagliflozin (JARDIANCE) 25 MG TABS tablet, Take 1 tablet (25 mg total) by mouth daily before breakfast., Disp: 90 tablet, Rfl: 1   escitalopram (LEXAPRO) 20 MG tablet, Take 1 tablet (20 mg) by mouth daily., Disp: 30 tablet, Rfl: 6   glucose blood (ACCU-CHEK GUIDE) test strip, Check BS once a day, Disp: 100 each, Rfl: 12   hydrochlorothiazide (HYDRODIURIL) 25 MG tablet, TAKE 1 TABLET (25 MG TOTAL) BY MOUTH DAILY., Disp: 90 tablet, Rfl: 1   hydrOXYzine (ATARAX/VISTARIL) 25 MG tablet, Take 12.5 mg by mouth 2 (two) times daily as needed., Disp: , Rfl:    Lancets Misc. (ACCU-CHEK FASTCLIX LANCET) KIT, UAD, Disp: 100 kit, Rfl: 3   levonorgestrel (MIRENA) 20 MCG/24HR IUD, 1 each by Intrauterine route once., Disp: , Rfl:    meloxicam (MOBIC) 15 MG tablet, Take 1 tablet (15 mg total) by mouth daily., Disp: 30 tablet, Rfl: 1   predniSONE (DELTASONE) 20 MG tablet, Take 1 tablet (20 mg total) by mouth daily with breakfast., Disp: 5 tablet, Rfl: 0   Semaglutide,0.25 or 0.5MG /DOS, 2 MG/3ML SOPN, Inject 0.5 mg into the skin once a week., Disp: 3 mL, Rfl: 1   Spacer/Aero-Holding Chambers DEVI, 1 Units by Does not apply route in the morning, at noon, and at bedtime., Disp: 1 Units,  Rfl: 0   triamcinolone (KENALOG) 0.025 % ointment, APPLY TO AFFECTED AREA TWICE A DAY, Disp: 60 g, Rfl: 0   TRUEplus Lancets 28G MISC, Use as directed, Disp: 100 each, Rfl: 5  Observations/Objective: Patient is well-developed, well-nourished in no acute distress.  Resting comfortably  at home.  Head is normocephalic, atraumatic.  No labored breathing.  Speech is clear and coherent with logical content.  Patient is alert and oriented at baseline.    Assessment and Plan: 1. Influenza (Primary)  Increase fluids, humidifier at night, tylenol or ibuprofen as directed, UC if sx persist or worsen.   Follow Up Instructions: I discussed the assessment and treatment plan with the patient. The patient was provided an opportunity to ask questions and all were answered. The patient agreed with the plan and demonstrated an understanding of the instructions.  A copy of instructions were sent to the patient via  MyChart unless otherwise noted below.     The patient was advised to call back or seek an in-person evaluation if the symptoms worsen or if the condition fails to improve as anticipated.    Whitney Curio, FNP

## 2023-07-25 DIAGNOSIS — Z630 Problems in relationship with spouse or partner: Secondary | ICD-10-CM | POA: Diagnosis not present

## 2023-07-25 DIAGNOSIS — F411 Generalized anxiety disorder: Secondary | ICD-10-CM | POA: Diagnosis not present

## 2023-07-26 ENCOUNTER — Other Ambulatory Visit: Payer: Self-pay | Admitting: Internal Medicine

## 2023-07-26 DIAGNOSIS — I10 Essential (primary) hypertension: Secondary | ICD-10-CM

## 2023-07-28 NOTE — Telephone Encounter (Signed)
 Requested Prescriptions  Pending Prescriptions Disp Refills   hydrochlorothiazide (HYDRODIURIL) 25 MG tablet [Pharmacy Med Name: HYDROCHLOROTHIAZIDE 25 MG TAB] 90 tablet 0    Sig: TAKE 1 TABLET (25 MG TOTAL) BY MOUTH DAILY.     Cardiovascular: Diuretics - Thiazide Failed - 07/28/2023  2:46 PM      Failed - Cr in normal range and within 180 days    Creatinine, Ser  Date Value Ref Range Status  12/30/2022 0.78 0.57 - 1.00 mg/dL Final         Failed - K in normal range and within 180 days    Potassium  Date Value Ref Range Status  12/30/2022 4.1 3.5 - 5.2 mmol/L Final         Failed - Na in normal range and within 180 days    Sodium  Date Value Ref Range Status  12/30/2022 138 134 - 144 mmol/L Final         Failed - Valid encounter within last 6 months    Recent Outpatient Visits           7 months ago Type 2 diabetes mellitus with morbid obesity (HCC)   [facility] [place]. [facility] Marcine Matar, MD   10 months ago Periodic limb movement   [facility] - A Dept Of [place]. [facility] Eleonore Chiquito, FNP   1 year ago Type 2 diabetes mellitus with morbid obesity [facility])   [facility] - [facility] Dept Of [place]. [facility] Claiborne Rigg, NP   1 year ago Type 2 diabetes mellitus with morbid obesity [facility])   [place] [facility] - A Dept Of [place].  [facility] Jonah Heidinger B, MD   2 years ago Type 2 diabetes mellitus with morbid obesity [facility])   [place] [facility] - A Dept Of [place]. [facility] Marcine Matar, MD       Future Appointments             In 1 week Marcine Matar, MD [facility] [place] - A Dept Of [place]igha Brideg[place]. [facility]            Passed - Last BP in normal range    BP Readings from Last 1 Encounters:  05/31/23 108/76

## 2023-08-01 ENCOUNTER — Ambulatory Visit (HOSPITAL_BASED_OUTPATIENT_CLINIC_OR_DEPARTMENT_OTHER): Payer: Medicaid Other | Admitting: Internal Medicine

## 2023-08-01 DIAGNOSIS — G4733 Obstructive sleep apnea (adult) (pediatric): Secondary | ICD-10-CM | POA: Diagnosis not present

## 2023-08-01 DIAGNOSIS — F411 Generalized anxiety disorder: Secondary | ICD-10-CM | POA: Diagnosis not present

## 2023-08-01 DIAGNOSIS — Z630 Problems in relationship with spouse or partner: Secondary | ICD-10-CM | POA: Diagnosis not present

## 2023-08-05 ENCOUNTER — Other Ambulatory Visit: Payer: Self-pay | Admitting: Internal Medicine

## 2023-08-05 DIAGNOSIS — E1159 Type 2 diabetes mellitus with other circulatory complications: Secondary | ICD-10-CM

## 2023-08-05 DIAGNOSIS — E1169 Type 2 diabetes mellitus with other specified complication: Secondary | ICD-10-CM

## 2023-08-08 ENCOUNTER — Ambulatory Visit: Payer: Medicaid Other | Attending: Internal Medicine | Admitting: Internal Medicine

## 2023-08-08 ENCOUNTER — Telehealth: Payer: Self-pay | Admitting: *Deleted

## 2023-08-08 DIAGNOSIS — E119 Type 2 diabetes mellitus without complications: Secondary | ICD-10-CM

## 2023-08-08 DIAGNOSIS — E1151 Type 2 diabetes mellitus with diabetic peripheral angiopathy without gangrene: Secondary | ICD-10-CM | POA: Diagnosis not present

## 2023-08-08 DIAGNOSIS — Z6841 Body Mass Index (BMI) 40.0 and over, adult: Secondary | ICD-10-CM | POA: Diagnosis not present

## 2023-08-08 DIAGNOSIS — Z7985 Long-term (current) use of injectable non-insulin antidiabetic drugs: Secondary | ICD-10-CM | POA: Diagnosis not present

## 2023-08-08 DIAGNOSIS — I152 Hypertension secondary to endocrine disorders: Secondary | ICD-10-CM

## 2023-08-08 DIAGNOSIS — I10 Essential (primary) hypertension: Secondary | ICD-10-CM

## 2023-08-08 DIAGNOSIS — Z7984 Long term (current) use of oral hypoglycemic drugs: Secondary | ICD-10-CM

## 2023-08-08 DIAGNOSIS — Z23 Encounter for immunization: Secondary | ICD-10-CM

## 2023-08-08 DIAGNOSIS — Z2821 Immunization not carried out because of patient refusal: Secondary | ICD-10-CM | POA: Diagnosis not present

## 2023-08-08 DIAGNOSIS — E1169 Type 2 diabetes mellitus with other specified complication: Secondary | ICD-10-CM

## 2023-08-08 DIAGNOSIS — E785 Hyperlipidemia, unspecified: Secondary | ICD-10-CM | POA: Diagnosis not present

## 2023-08-08 DIAGNOSIS — E1159 Type 2 diabetes mellitus with other circulatory complications: Secondary | ICD-10-CM

## 2023-08-08 DIAGNOSIS — E1142 Type 2 diabetes mellitus with diabetic polyneuropathy: Secondary | ICD-10-CM

## 2023-08-08 DIAGNOSIS — Z1211 Encounter for screening for malignant neoplasm of colon: Secondary | ICD-10-CM

## 2023-08-08 LAB — POCT GLYCOSYLATED HEMOGLOBIN (HGB A1C): HbA1c, POC (controlled diabetic range): 7 % — AB (ref 0.0–7.0)

## 2023-08-08 LAB — GLUCOSE, POCT (MANUAL RESULT ENTRY): POC Glucose: 103 mg/dL — AB (ref 70–99)

## 2023-08-08 MED ORDER — EMPAGLIFLOZIN 25 MG PO TABS: 25.0000 mg | ORAL_TABLET | Freq: Every day | ORAL | 1 refills | Status: DC

## 2023-08-08 MED ORDER — ATORVASTATIN CALCIUM 40 MG PO TABS
40.0000 mg | ORAL_TABLET | Freq: Every day | ORAL | 1 refills | Status: DC
Start: 2023-08-08 — End: 2024-03-17

## 2023-08-08 MED ORDER — HYDROCHLOROTHIAZIDE 25 MG PO TABS: 25.0000 mg | ORAL_TABLET | Freq: Every day | ORAL | 1 refills | Status: DC

## 2023-08-08 MED ORDER — AMLODIPINE BESYLATE 10 MG PO TABS: 10.0000 mg | ORAL_TABLET | Freq: Every day | ORAL | 1 refills | Status: DC

## 2023-08-08 MED ORDER — CYCLOBENZAPRINE HCL 10 MG PO TABS
10.0000 mg | ORAL_TABLET | Freq: Two times a day (BID) | ORAL | 0 refills | Status: DC | PRN
Start: 2023-08-08 — End: 2023-10-10

## 2023-08-08 MED ORDER — GABAPENTIN 300 MG PO CAPS: 300.0000 mg | ORAL_CAPSULE | Freq: Every day | ORAL | 3 refills | Status: AC

## 2023-08-08 NOTE — Patient Instructions (Signed)
 Restarted Ozempic 0.5 mg once a week.  Please pick a day of the week and a time when you would be most consistent with taking it.  Your blood pressure is not at goal.  Goal is 130/80 or lower.  Since you do not want another medication added at this time, I recommend checking blood pressure at least once or twice a week and recording the readings.  Bring those readings with you on next visit.

## 2023-08-08 NOTE — Telephone Encounter (Signed)
 Copied from CRM [id number]. Topic: Appointments - Scheduling Inquiry for Clinic >> Aug 08, 2023  1:21 PM Marlow Baars wrote: Reason for CRM: The patient called in stating she had an appt this morning but forgot to ask for a work note for the day. She would like it if one can be uploaded to her my chart. Please assist patient further. She states she really needs it by Monday if possible and if someone can just send a message in my chart letting her know it is done.

## 2023-08-08 NOTE — Progress Notes (Signed)
 Patient ID: Whitney White, female    DOB: 09-Feb-1976  MRN: [id number]  CC: Hypertension (HTN f/u. Med refills. /Discuss semiglutide - pt missed administration days /Neuropathy on fingers X1 mo/Yes to flu & pneumonia vax)   Subjective: Whitney White is a 48 y.o. female who presents for chronic ds management. Her concerns today include:  Pt with hx of HTN, anxiety/depression, GERD, DOE, DM II, HL and obesity.   Discussed the use of AI scribe software for clinical note transcription with the patient, who gave verbal consent to proceed.  History of Present Illness   The patient, with a history of diabetes, hyperlipidemia, and hypertension, presents for a follow-up appointment.   DM: Results for orders placed or performed in visit on 08/08/23  POCT glycosylated hemoglobin (Hb A1C)   Collection Time: 08/08/23 11:25 AM  Result Value Ref Range   Hemoglobin A1C     HbA1c POC (<> result, manual entry)     HbA1c, POC (prediabetic range)     HbA1c, POC (controlled diabetic range) 103.0 (A) 0.0 - 7.0 %  POCT glucose (manual entry)   Collection Time: 08/08/23 11:30 AM  Result Value Ref Range   POC Glucose 7.0 (A) 70 - 99 mg/dl    She reports struggling with medication adherence, particularly with Ozempic, due to forgetfulness and scheduling conflicts.  She initially set alarm on her phone for Sundays at a certain time.  However usually when her alarm goes off she is not at home to take the Ozempic and then she forgets to take it.  She has stopped taking Ozempic since late January but continues to take Jardiance 25mg  daily. She also reports increased thirst and frequent urination, which she manages by drinking more fluids, including occasional sugary drinks. In terms of lifestyle, the patient tries to limit her salt intake and avoids adding salt to her food. She has also been trying to limit her starch intake and portion sizes. However, she admits to struggling with her eating habits and has  noticed an increased desire for sugary drinks.   The patient has also noticed an increase in neuropathic symptoms, particularly tingling and numbness in the fingertips of both hands, over the past month.  Also gets tingling and numbness in the toes but not as much is in the fingertips.  She was on gabapentin in the past.  Her significant other is currently on gabapentin.  She has been taking one of his at night and finds it helpful.   HTN: Reports compliance with hydrochlorothiazide 25 mg daily and amlodipine 10 mg daily.  Does have access to a blood pressure device from her significant other but has not been checking blood pressure.  Also reports compliance with taking cholesterol medicine atorvastatin 40 mg daily.       Patient Active Problem List   Diagnosis Date Noted   Pain in right shoulder 01/03/2023   OSA (obstructive sleep apnea) 12/30/2022   Excessive daytime sleepiness 12/04/2022   Screening for cervical cancer 06/06/2022   Screening examination for STD (sexually transmitted disease) 06/06/2022   Vaginal bleeding 06/06/2022   Periodic limb movement 03/10/2017   RLS (restless legs syndrome) 01/26/2017   Mild diastolic dysfunction 12/06/2016   Prediabetes 11/26/2016   Hyperlipidemia 11/26/2016   Anxiety state 12/22/2013   Class 3 severe obesity with body mass index (BMI) of 50.0 to 59.9 in adult [facility]) 12/22/2013   Chest pain 12/17/2013   Depression    Hypertension    Eczema  Gestational hypertension    GERD (gastroesophageal reflux disease)      Current Outpatient Medications on File Prior to Visit  Medication Sig Dispense Refill   albuterol (VENTOLIN HFA) 108 (90 Base) MCG/ACT inhaler Inhale 1-2 puffs into the lungs every 6 (six) hours as needed for wheezing or shortness of breath. 8 g 0   Blood Glucose Monitoring Suppl (ACCU-CHEK GUIDE ME) w/Device KIT UAD 1 kit 0   escitalopram (LEXAPRO) 20 MG tablet Take 1 tablet (20 mg) by mouth daily. 30 tablet 6   glucose blood  (ACCU-CHEK GUIDE) test strip Check BS once a day 100 each 12   hydrOXYzine (ATARAX/VISTARIL) 25 MG tablet Take 12.5 mg by mouth 2 (two) times daily as needed.     Lancets Misc. (ACCU-CHEK FASTCLIX LANCET) KIT UAD 100 kit 3   levonorgestrel (MIRENA) 20 MCG/24HR IUD 1 each by Intrauterine route once.     meloxicam (MOBIC) 15 MG tablet Take 1 tablet (15 mg total) by mouth daily. 30 tablet 1   Semaglutide,0.25 or 0.5MG /DOS, 2 MG/3ML SOPN Inject 0.5 mg into the skin once a week. 3 mL 1   Spacer/Aero-Holding Chambers DEVI 1 Units by Does not apply route in the morning, at noon, and at bedtime. 1 Units 0   triamcinolone (KENALOG) 0.025 % ointment APPLY TO AFFECTED AREA TWICE A DAY 60 g 0   TRUEplus Lancets 28G MISC Use as directed 100 each 5   No current facility-administered medications on file prior to visit.    Allergies  Allergen Reactions   Lisinopril Cough   Metformin And Related     Caused palpitations/heart pounding    Social History   Socioeconomic History   Marital status: Divorced    Spouse name: Not on file   Number of children: Not on file   Years of education: Not on file   Highest education level: 12th grade  Occupational History   Not on file  Tobacco Use   Smoking status: Former    Types: Cigarettes   Smokeless tobacco: Never  Vaping Use   Vaping status: Never Used  Substance and Sexual Activity   Alcohol use: Yes    Comment: occaisonally   Drug use: No   Sexual activity: Yes    Birth control/protection: I.U.D.    Comment: mirena in place  Other Topics Concern   Not on file  Social History Narrative   Are you right handed or left handed? left   Are you currently employed ? 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 no   Who lives with you? With children   What type of home do you live in: 1 story or 2 story? 2 story         Social Drivers of Corporate investment banker Strain: Medium Risk (08/08/2023)   Overall  Financial Resource Strain (CARDIA)    Difficulty of Paying Living Expenses: Somewhat hard  Food Insecurity: Food Insecurity Present (08/08/2023)   Hunger Vital Sign    Worried About Running Out of Food in the Last Year: Often true    Ran Out of Food in the Last Year: Often true  Transportation Needs: No Transportation Needs (08/08/2023)   PRAPARE - Administrator, Civil Service (Medical): No    Lack of Transportation (Non-Medical): No  Physical Activity: Insufficiently Active (08/08/2023)   Exercise Vital Sign    Days of Exercise per Week: 2 days  Minutes of Exercise per Session: 20 min  Stress: No Stress Concern Present (08/08/2023)   [employer] of Occupational Health - Occupational Stress Questionnaire    Feeling of Stress : Only a little  Social Connections: Socially Isolated (08/08/2023)   Social Connection and Isolation Panel [NHANES]    Frequency of Communication with Friends and Family: Never    Frequency of Social Gatherings with Friends and Family: Never    Attends Religious Services: Never    Diplomatic Services operational officer: No    Attends Engineer, structural: Not on file    Marital Status: Living with partner  Intimate Partner Violence: Not on file    Family History  Problem Relation Age of Onset   Hypertension Mother    Stroke Mother    Diabetes Maternal Grandmother    Hypertension Maternal Grandmother    Cancer Maternal Aunt        breast cancer    Anesthesia problems Neg Hx     Past Surgical History:  Procedure Laterality Date   NO PAST SURGERIES      ROS: Review of Systems Negative except as stated above  PHYSICAL EXAM: BP 123/86   Pulse 82   Temp 97.9 F (36.6 C) (Oral)   Ht 5\' 2"  (1.575 m)   Wt 288 lb (130.6 kg)   SpO2 95%   BMI 52.68 kg/m   Wt Readings from Last 3 Encounters:  08/08/23 288 lb (130.6 kg)  02/19/23 295 lb (133.8 kg)  12/30/22 290 lb (131.5 kg)    Physical Exam  General appearance - alert,  well appearing, and in no distress Mental status - normal mood, behavior, speech, dress, motor activity, and thought processes Neck - supple, no significant adenopathy Chest - clear to auscultation, no wheezes, rales or rhonchi, symmetric air entry Heart - normal rate, regular rhythm, normal S1, S2, no murmurs, rubs, clicks or gallops Extremities - peripheral pulses normal, no pedal edema, no clubbing or cyanosis      Latest Ref Rng & Units 12/30/2022    4:54 PM 03/27/2022   10:15 AM 08/24/2021    4:51 PM  CMP  Glucose 70 - 99 mg/dL 213  96  98   BUN 6 - 24 mg/dL 11  9  6    Creatinine 0.57 - 1.00 mg/dL 0.86  5.78  4.69   Sodium 134 - 144 mmol/L 138  141  140   Potassium 3.5 - 5.2 mmol/L 4.1  3.8  4.1   Chloride 96 - 106 mmol/L 92  96  100   CO2 20 - 29 mmol/L 29  28  29    Calcium 8.7 - 10.2 mg/dL 62.9  9.8  9.5   Total Protein 6.0 - 8.5 g/dL 8.3  7.3    Total Bilirubin 0.0 - 1.2 mg/dL 0.8  0.7    Alkaline Phos 44 - 121 IU/L 72  68    AST 0 - 40 IU/L 16  15    ALT 0 - 32 IU/L 16  15     Lipid Panel     Component Value Date/Time   CHOL 228 (H) 12/30/2022 1654   TRIG 106 12/30/2022 1654   HDL 57 12/30/2022 1654   CHOLHDL 4.0 12/30/2022 1654   CHOLHDL 4.5 01/06/2014 1241   VLDL 17 01/06/2014 1241   LDLCALC 152 (H) 12/30/2022 1654    CBC    Component Value Date/Time   WBC 8.2 12/30/2022 1654   WBC 8.1 08/24/2016  0042   RBC 6.38 (H) 12/30/2022 1654   RBC 5.06 08/24/2016 0042   HGB 15.6 12/30/2022 1654   HCT 50.4 (H) 12/30/2022 1654   PLT 408 12/30/2022 1654   MCV 79 12/30/2022 1654   MCH 24.5 (L) 12/30/2022 1654   MCH 24.1 (L) 08/24/2016 0042   MCHC 31.0 (L) 12/30/2022 1654   MCHC 31.4 08/24/2016 0042   RDW 14.7 12/30/2022 1654   LYMPHSABS 2.0 08/24/2016 0042   MONOABS 0.4 08/24/2016 0042   EOSABS 0.1 08/24/2016 0042   BASOSABS 0.0 08/24/2016 0042    ASSESSMENT AND PLAN: 1. Type 2 diabetes mellitus with morbid obesity (HCC) (Primary) Close to goal on A1c.  She  will continue Jardiance.  We discussed ways to help her remember to take the Ozempic on the same day every week.  She will choose a day of the week and take it before bedtime.  She still has some of the 0.5 mg dose at home.  After she has been on this for 1 month and tolerating it, we will increase the dose.  Will bring her back in 1 month to see how she is doing. - POCT glycosylated hemoglobin (Hb A1C) - POCT glucose (manual entry) - empagliflozin (JARDIANCE) 25 MG TABS tablet; Take 1 tablet (25 mg total) by mouth daily before breakfast.  Dispense: 90 tablet; Refill: 1 - Ambulatory referral to Ophthalmology - Microalbumin / creatinine urine ratio  2. Long-term (current) use of injectable non-insulin antidiabetic drugs See #1 above  3. Diabetes mellitus treated with oral medication (HCC) See #1 above  4. Diabetic peripheral neuropathy (HCC) Advised against taking other peoples medications.  We will put her on gabapentin 300 mg at bedtime. - gabapentin (NEURONTIN) 300 MG capsule; Take 1 capsule (300 mg total) by mouth at bedtime.  Dispense: 90 capsule; Refill: 3  5. Hyperlipidemia associated with type 2 diabetes mellitus (HCC) - atorvastatin (LIPITOR) 40 MG tablet; Take 1 tablet (40 mg total) by mouth daily.  Dispense: 90 tablet; Refill: 1  6. Hypertension associated with type 2 diabetes mellitus (HCC) Not at goal.  DASH diet encouraged.  We discussed adding another medication but patient wants to hold off and try to check blood pressure at home.  She will check blood pressure 1-2 times a week and record the readings and bring them in on next visit.  Goal is 130/80 or lower. - amLODipine (NORVASC) 10 MG tablet; Take 1 tablet (10 mg total) by mouth daily.  Dispense: 90 tablet; Refill: 1 - hydrochlorothiazide (HYDRODIURIL) 25 MG tablet; Take 1 tablet (25 mg total) by mouth daily.  Dispense: 90 tablet; Refill: 1  7. Influenza vaccination declined  8. Need for vaccination against Streptococcus  pneumoniae - PNEUMOCOCCAL CONJUGATE VACCINE 15-VALENT  9. Screening for colon cancer - Cologuard   Patient was given the opportunity to ask questions.  Patient verbalized understanding of the plan and was able to repeat key elements of the plan.   This documentation was completed using Paediatric nurse.  Any transcriptional errors are unintentional.  Orders Placed This Encounter  Procedures   PNEUMOCOCCAL CONJUGATE VACCINE 15-VALENT   Cologuard   Microalbumin / creatinine urine ratio   Ambulatory referral to Ophthalmology   POCT glycosylated hemoglobin (Hb A1C)   POCT glucose (manual entry)     Requested Prescriptions   Signed Prescriptions Disp Refills   atorvastatin (LIPITOR) 40 MG tablet 90 tablet 1    Sig: Take 1 tablet (40 mg total) by mouth daily.  amLODipine (NORVASC) 10 MG tablet 90 tablet 1    Sig: Take 1 tablet (10 mg total) by mouth daily.   empagliflozin (JARDIANCE) 25 MG TABS tablet 90 tablet 1    Sig: Take 1 tablet (25 mg total) by mouth daily before breakfast.   cyclobenzaprine (FLEXERIL) 10 MG tablet 20 tablet 0    Sig: Take 1 tablet (10 mg total) by mouth 2 (two) times daily as needed for muscle spasms.   hydrochlorothiazide (HYDRODIURIL) 25 MG tablet 90 tablet 1    Sig: Take 1 tablet (25 mg total) by mouth daily.   gabapentin (NEURONTIN) 300 MG capsule 90 capsule 3    Sig: Take 1 capsule (300 mg total) by mouth at bedtime.    Return in about 6 weeks (around 09/19/2023) for Recheck BP/wg management on Ozempic.  Jonah Boxley, MD, FACP

## 2023-08-11 ENCOUNTER — Encounter: Payer: Self-pay | Admitting: Internal Medicine

## 2023-08-11 LAB — MICROALBUMIN / CREATININE URINE RATIO
Creatinine, Urine: 47.1 mg/dL
Microalb/Creat Ratio: 26 mg/g{creat} (ref 0–29)
Microalbumin, Urine: 12.2 ug/mL

## 2023-08-14 DIAGNOSIS — F411 Generalized anxiety disorder: Secondary | ICD-10-CM | POA: Diagnosis not present

## 2023-08-14 DIAGNOSIS — Z630 Problems in relationship with spouse or partner: Secondary | ICD-10-CM | POA: Diagnosis not present

## 2023-08-14 DIAGNOSIS — R4581 Low self-esteem: Secondary | ICD-10-CM | POA: Diagnosis not present

## 2023-08-29 DIAGNOSIS — F411 Generalized anxiety disorder: Secondary | ICD-10-CM | POA: Diagnosis not present

## 2023-08-29 DIAGNOSIS — Z630 Problems in relationship with spouse or partner: Secondary | ICD-10-CM | POA: Diagnosis not present

## 2023-09-05 DIAGNOSIS — F411 Generalized anxiety disorder: Secondary | ICD-10-CM | POA: Diagnosis not present

## 2023-09-05 DIAGNOSIS — Z630 Problems in relationship with spouse or partner: Secondary | ICD-10-CM | POA: Diagnosis not present

## 2023-09-12 DIAGNOSIS — Z630 Problems in relationship with spouse or partner: Secondary | ICD-10-CM | POA: Diagnosis not present

## 2023-09-12 DIAGNOSIS — F411 Generalized anxiety disorder: Secondary | ICD-10-CM | POA: Diagnosis not present

## 2023-09-19 DIAGNOSIS — Z630 Problems in relationship with spouse or partner: Secondary | ICD-10-CM | POA: Diagnosis not present

## 2023-09-19 DIAGNOSIS — F411 Generalized anxiety disorder: Secondary | ICD-10-CM | POA: Diagnosis not present

## 2023-09-23 DIAGNOSIS — F411 Generalized anxiety disorder: Secondary | ICD-10-CM | POA: Diagnosis not present

## 2023-09-23 DIAGNOSIS — Z630 Problems in relationship with spouse or partner: Secondary | ICD-10-CM | POA: Diagnosis not present

## 2023-09-25 ENCOUNTER — Encounter (HOSPITAL_COMMUNITY): Payer: Self-pay

## 2023-09-25 ENCOUNTER — Ambulatory Visit (HOSPITAL_COMMUNITY)
Admission: RE | Admit: 2023-09-25 | Discharge: 2023-09-25 | Disposition: A | Discharge status: Home / Self Care | Source: Ambulatory Visit | Attending: Family Medicine | Admitting: Family Medicine

## 2023-09-25 VITALS — BP 105/72 | HR 85 | Temp 98.8°F | Resp 16 | Ht 60.0 in | Wt 288.0 lb

## 2023-09-25 DIAGNOSIS — L02211 Cutaneous abscess of abdominal wall: Secondary | ICD-10-CM | POA: Diagnosis not present

## 2023-09-25 MED ORDER — HYDROCODONE-ACETAMINOPHEN 5-325 MG PO TABS: 1.0000 | ORAL_TABLET | Freq: Four times a day (QID) | ORAL | 0 refills | Status: DC | PRN

## 2023-09-25 MED ORDER — DOXYCYCLINE HYCLATE 100 MG PO CAPS: 100.0000 mg | ORAL_CAPSULE | Freq: Two times a day (BID) | ORAL | 0 refills | Status: DC

## 2023-09-25 NOTE — ED Triage Notes (Signed)
 Patient here today with c/o an abscess on the lower part of her abd X 2 days. Patient states that she has 2 painful bumps.

## 2023-09-25 NOTE — Discharge Instructions (Signed)
 Be aware, you have been prescribed pain medications that may cause drowsiness. While taking this medication, do not take any other medications containing acetaminophen (Tylenol). Do not combine with alcohol or recreational drugs. Please do not drive, operate heavy machinery, or take part in activities that require making important decisions while on this medication as your judgement may be clouded.

## 2023-09-25 NOTE — ED Provider Notes (Signed)
 [facility]   [id number] 09/25/23 Arrival Time: 1256  ASSESSMENT & PLAN:  1. Abdominal wall abscess    I have recommended I&D today but she declines. Prefers trial of antibiotic and hot compresses to area first. Agrees to return if area does not spontaneously drain over next 48 hours.  Meds ordered this encounter  Medications   doxycycline  (VIBRAMYCIN ) 100 MG capsule    Sig: Take 1 capsule (100 mg total) by mouth 2 (two) times daily.    Dispense:  14 capsule    Refill:  0   HYDROcodone -acetaminophen  (NORCO/VICODIN) 5-325 MG tablet    Sig: Take 1 tablet by mouth every 6 (six) hours as needed for moderate pain (pain score 4-6) or severe pain (pain score 7-10).    Dispense:  8 tablet    Refill:  0    Reviewed expectations re: course of current medical issues. Questions answered. Outlined signs and symptoms indicating need for more acute intervention. Patient verbalized understanding. After Visit Summary given.   SUBJECTIVE:  Whitney White is a 48 y.o. female with DM who presents with a possible infection of her lower abd wall; noted x 48 hours; very painful. Denies drainage/bleeding. No tx PTA.   OBJECTIVE:  Vitals:   09/25/23 1342  BP: 105/72  Pulse: 85  Resp: 16  Temp: 98.8 F (37.1 C)  TempSrc: Oral  SpO2: 90%  Weight: 130.6 kg  Height: 5' (1.524 m)    General appearance: alert; no distress Abd: painful and fluctuant approx 2x2 cm induration of lower mid abdominal wall; with overlying erythema Psychological: alert and cooperative; normal mood and affect  Allergies  Allergen Reactions   Lisinopril  Cough   Metformin  And Related     Caused palpitations/heart pounding    Past Medical History:  Diagnosis Date   Depression    Diabetes mellitus without complication (HCC)    "pre-diabetic"   Eczema    GERD (gastroesophageal reflux disease)    Gestational hypertension    Hypertension    Social History   Socioeconomic History   Marital status:  Divorced    Spouse name: Not on file   Number of children: Not on file   Years of education: Not on file   Highest education level: 12th grade  Occupational History   Not on file  Tobacco Use   Smoking status: Former    Types: Cigarettes   Smokeless tobacco: Never  Vaping Use   Vaping status: Never Used  Substance and Sexual Activity   Alcohol use: Yes    Comment: occaisonally   Drug use: No   Sexual activity: Yes    Birth control/protection: I.U.D.    Comment: mirena  in place  Other Topics Concern   Not on file  Social History Narrative   Are you right handed or left handed? left   Are you currently employed ? 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 no   Who lives with you? With children   What type of home do you live in: 1 story or 2 story? 2 story         Social Drivers of Corporate investment banker Strain: Medium Risk (08/08/2023)   Overall Financial Resource Strain (CARDIA)    Difficulty of Paying Living Expenses: Somewhat hard  Food Insecurity: Food Insecurity Present (08/08/2023)   Hunger Vital Sign    Worried About Running Out of Food in the Last Year: Often  true    Ran Out of Food in the Last Year: Often true  Transportation Needs: No Transportation Needs (08/08/2023)   PRAPARE - Administrator, Civil Service (Medical): No    Lack of Transportation (Non-Medical): No  Physical Activity: Insufficiently Active (08/08/2023)   Exercise Vital Sign    Days of Exercise per Week: 2 days    Minutes of Exercise per Session: 20 min  Stress: No Stress Concern Present (08/08/2023)   [employer] of Occupational Health - Occupational Stress Questionnaire    Feeling of Stress : Only a little  Social Connections: Socially Isolated (08/08/2023)   Social Connection and Isolation Panel [NHANES]    Frequency of Communication with Friends and Family: Never    Frequency of Social Gatherings with Friends and Family: Never     Attends Religious Services: Never    Database administrator or Organizations: No    Attends Engineer, structural: Not on file    Marital Status: Living with partner   Family History  Problem Relation Age of Onset   Hypertension Mother    Stroke Mother    Diabetes Maternal Grandmother    Hypertension Maternal Grandmother    Cancer Maternal Aunt        breast cancer    Anesthesia problems Neg Hx    Past Surgical History:  Procedure Laterality Date   NO PAST SURGERIES              Afton Albright, MD 09/25/23 1416

## 2023-10-01 ENCOUNTER — Other Ambulatory Visit: Payer: Self-pay | Admitting: Internal Medicine

## 2023-10-02 DIAGNOSIS — F411 Generalized anxiety disorder: Secondary | ICD-10-CM | POA: Diagnosis not present

## 2023-10-04 DIAGNOSIS — G4733 Obstructive sleep apnea (adult) (pediatric): Secondary | ICD-10-CM | POA: Diagnosis not present

## 2023-10-06 ENCOUNTER — Ambulatory Visit: Admitting: Internal Medicine

## 2023-10-07 DIAGNOSIS — F411 Generalized anxiety disorder: Secondary | ICD-10-CM | POA: Diagnosis not present

## 2023-10-07 DIAGNOSIS — Z564 Discord with boss and workmates: Secondary | ICD-10-CM | POA: Diagnosis not present

## 2023-10-10 ENCOUNTER — Encounter: Payer: Self-pay | Admitting: Pharmacist

## 2023-10-10 ENCOUNTER — Encounter (HOSPITAL_COMMUNITY): Payer: Self-pay

## 2023-10-10 ENCOUNTER — Other Ambulatory Visit: Payer: Self-pay

## 2023-10-10 ENCOUNTER — Ambulatory Visit (HOSPITAL_COMMUNITY)
Admission: RE | Admit: 2023-10-10 | Discharge: 2023-10-10 | Disposition: A | Discharge status: Home / Self Care | Source: Ambulatory Visit | Attending: Nurse Practitioner

## 2023-10-10 VITALS — BP 109/74 | HR 89 | Temp 98.1°F | Resp 16

## 2023-10-10 DIAGNOSIS — L02211 Cutaneous abscess of abdominal wall: Secondary | ICD-10-CM

## 2023-10-10 MED ORDER — LIDOCAINE HCL 2 % IJ SOLN
INTRAMUSCULAR | Status: AC
  Filled 2023-10-10: qty 20

## 2023-10-10 MED ORDER — SULFAMETHOXAZOLE-TRIMETHOPRIM 800-160 MG PO TABS: 1.0000 | ORAL_TABLET | Freq: Two times a day (BID) | ORAL | 0 refills | Status: AC

## 2023-10-10 MED ORDER — IBUPROFEN 800 MG PO TABS: 800.0000 mg | ORAL_TABLET | Freq: Three times a day (TID) | ORAL | 0 refills | Status: AC | PRN

## 2023-10-10 NOTE — Discharge Instructions (Addendum)
 You were seen today for an absces.  An abscess is an infection under the skin that can happen anywhere on the body. If not treated, it can get worse and cause a more serious infection.  Today, we performed a procedure called an I&D, which stands for incision and drainage. This means a small cut was made to let the infection and pus drain out. The area was cleaned thoroughly, and a gauze packing was placed inside the open area to help it heal properly. This packing should stay in place until you return for your follow-up visit. A sample of the drainage was sent to the lab to check for any bacteria.  You have been prescribed antibiotics. Be sure to take them exactly as directed, and always take them with food to help avoid an upset stomach. Keep using warm, moist compresses on the area to help with healing.   Watch the area closely for any signs that the infection is getting worse, such as increased redness, swelling, pain, pus, or fever. If you notice any of these symptoms, contact your doctor, return to urgent care or go to the ED right away.

## 2023-10-10 NOTE — ED Provider Notes (Signed)
 [facility]    CSN: [id number] Arrival date & time: 10/10/23  1109      History   Chief Complaint Chief Complaint  Patient presents with   Abscess    Large  boil on underside of my belly. - Entered by patient    HPI Whitney White is a 48 y.o. female.   Subjective:   Whitney White is a 47 year old female who presents for evaluation of a probable cutaneous abscess located on the lower abdomen. The patient reports the onset of localized pain to the area approximately one day ago, which has since become more pronounced. She notes a history of a similar abscess in the same area less than one month ago and was previously evaluated here on 09/25/2023. At that time, incision and drainage (I&D) were recommended; however, she declined due to fear of the procedure. Instead, she opted for supportive treatment with doxycycline  and warm compresses.  The patient states the prior lesion spontaneously drained 1-2 days after initiating antibiotics, producing a small amount of clear, odorless fluid. She denies fevers, chills, or systemic symptoms. No spontaneous drainage has occurred from the current lesion, and she has not initiated any treatment prior to today's visit.  The patient has a history of diabetes mellitus with a most recent HbA1c of 7% dated 08/08/2023. She reports normal renal function per labs drawn in July 2024.  The following portions of the patient's history were reviewed and updated as appropriate: allergies, current medications, past family history, past medical history, past social history, past surgical history, and problem list.          Past Medical History:  Diagnosis Date   Depression    Diabetes mellitus without complication (HCC)    "pre-diabetic"   Eczema    GERD (gastroesophageal reflux disease)    Gestational hypertension    Hypertension     Patient Active Problem List   Diagnosis Date Noted   Pain in right shoulder 01/03/2023   OSA (obstructive  sleep apnea) 12/30/2022   Excessive daytime sleepiness 12/04/2022   Screening for cervical cancer 06/06/2022   Screening examination for STD (sexually transmitted disease) 06/06/2022   Vaginal bleeding 06/06/2022   Periodic limb movement 03/10/2017   RLS (restless legs syndrome) 01/26/2017   Mild diastolic dysfunction 12/06/2016   Prediabetes 11/26/2016   Hyperlipidemia 11/26/2016   Anxiety state 12/22/2013   Class 3 severe obesity with body mass index (BMI) of 50.0 to 59.9 in adult 12/22/2013   Chest pain 12/17/2013   Depression    Hypertension    Eczema    Gestational hypertension    GERD (gastroesophageal reflux disease)     Past Surgical History:  Procedure Laterality Date   NO PAST SURGERIES      OB History     Gravida  4   Para  4   Term  4   Preterm  0   AB  0   Living  4      SAB  0   IAB  0   Ectopic  0   Multiple  0   Live Births  1            Home Medications    Prior to Admission medications   Medication Sig Start Date End Date Taking? Authorizing Provider  ibuprofen  (ADVIL ) 800 MG tablet Take 1 tablet (800 mg total) by mouth every 8 (eight) hours as needed (pain). 10/10/23  Yes Kisa Fujii, FNP  sulfamethoxazole-trimethoprim (BACTRIM DS)  800-160 MG tablet Take 1 tablet by mouth 2 (two) times daily for 7 days. 10/10/23 10/17/23 Yes Maryruth Sol, FNP  albuterol  (VENTOLIN  HFA) 108 (90 Base) MCG/ACT inhaler Inhale 1-2 puffs into the lungs every 6 (six) hours as needed for wheezing or shortness of breath. 09/28/22   Lora Robinsons, FNP  amLODipine  (NORVASC ) 10 MG tablet Take 1 tablet (10 mg total) by mouth daily. 08/08/23   Lawrance Presume, MD  atorvastatin  (LIPITOR) 40 MG tablet Take 1 tablet (40 mg total) by mouth daily. 08/08/23   Lawrance Presume, MD  Blood Glucose Monitoring Suppl (ACCU-CHEK GUIDE ME) w/Device KIT UAD 06/20/22   Lawrance Presume, MD  busPIRone (BUSPAR) 15 MG tablet Take 15 mg by mouth 2 (two) times daily. 09/03/23    [provider]  empagliflozin  (JARDIANCE ) 25 MG TABS tablet Take 1 tablet (25 mg total) by mouth daily before breakfast. 08/08/23   Lawrance Presume, MD  escitalopram  (LEXAPRO ) 20 MG tablet Take 1 tablet (20 mg) by mouth daily. 02/14/23   Lawrance Presume, MD  gabapentin  (NEURONTIN ) 300 MG capsule Take 1 capsule (300 mg total) by mouth at bedtime. 08/08/23   Lawrance Presume, MD  glucose blood (ACCU-CHEK GUIDE) test strip Check BS once a day 06/20/22   Lawrance Presume, MD  hydrochlorothiazide  (HYDRODIURIL ) 25 MG tablet Take 1 tablet (25 mg total) by mouth daily. 08/08/23   Lawrance Presume, MD  hydrOXYzine  (ATARAX /VISTARIL ) 25 MG tablet Take 12.5 mg by mouth 2 (two) times daily as needed. 10/02/20   [provider]  Lancets Misc. (ACCU-CHEK FASTCLIX LANCET) KIT UAD 06/20/22   Lawrance Presume, MD  levonorgestrel  (MIRENA ) 20 MCG/24HR IUD 1 each by Intrauterine route once.    [provider]  Semaglutide ,0.25 or 0.5MG /DOS, (OZEMPIC , 0.25 OR 0.5 MG/DOSE,) 2 MG/3ML SOPN INJECT 0.5 MG INTO THE SKIN ONE TIME PER WEEK 10/01/23   Lawrance Presume, MD  Spacer/Aero-Holding Idelle Majors DEVI 1 Units by Does not apply route in the morning, at noon, and at bedtime. 06/20/22   Gretel Leaven, PA-C  triamcinolone  (KENALOG ) 0.025 % ointment APPLY TO AFFECTED AREA TWICE A DAY 11/08/22   Lawrance Presume, MD  TRUEplus Lancets 28G MISC Use as directed 12/27/19   Senaida Dama, NP    Family History Family History  Problem Relation Age of Onset   Hypertension Mother    Stroke Mother    Diabetes Maternal Grandmother    Hypertension Maternal Grandmother    Cancer Maternal Aunt        breast cancer    Anesthesia problems Neg Hx     Social History Social History   Tobacco Use   Smoking status: Former    Types: Cigarettes   Smokeless tobacco: Never  Vaping Use   Vaping status: Never Used  Substance Use Topics   Alcohol use: Yes    Comment: occaisonally   Drug use: No      Allergies   Lisinopril  and Metformin  and related   Review of Systems Review of Systems  Constitutional:  Negative for chills and fever.  Gastrointestinal:  Negative for nausea and vomiting.  Musculoskeletal:  Negative for myalgias.  Skin:  Positive for wound.  All other systems reviewed and are negative.    Physical Exam Triage Vital Signs ED Triage Vitals [10/10/23 1126]  Encounter Vitals Group     BP 109/74     Systolic BP Percentile      Diastolic BP Percentile  Pulse Rate 89     Resp 16     Temp 98.1 F (36.7 C)     Temp Source Oral     SpO2 92 %     Weight      Height      Head Circumference      Peak Flow      Pain Score 5     Pain Loc      Pain Education      Exclude from Growth Chart    No data found.  Updated Vital Signs BP 109/74 (BP Location: Right Arm)   Pulse 89   Temp 98.1 F (36.7 C) (Oral)   Resp 16   LMP 08/24/2023 (Approximate)   SpO2 92%   Visual Acuity Right Eye Distance:   Left Eye Distance:   Bilateral Distance:    Right Eye Near:   Left Eye Near:    Bilateral Near:     Physical Exam Vitals reviewed.  Constitutional:      General: She is awake. She is not in acute distress.    Appearance: Normal appearance. She is well-developed. She is obese. She is not ill-appearing, toxic-appearing or diaphoretic.  HENT:     Head: Normocephalic.     Mouth/Throat:     Mouth: Mucous membranes are moist.  Eyes:     Conjunctiva/sclera: Conjunctivae normal.  Cardiovascular:     Rate and Rhythm: Normal rate and regular rhythm.     Heart sounds: Normal heart sounds.  Pulmonary:     Effort: Pulmonary effort is normal.     Breath sounds: Normal breath sounds.  Abdominal:     Palpations: Abdomen is soft.       Comments: Cutaneous abscess located on the lower abdomen beneath the pannus. Lesion is fluctuant without surrounding erythema.  Musculoskeletal:        General: Normal range of motion.  Skin:    General: Skin is warm and  dry.     Findings: Abscess (see above) present.  Neurological:     General: No focal deficit present.     Mental Status: She is alert and oriented to person, place, and time.     Cranial Nerves: Cranial nerves 2-12 are intact.     Sensory: Sensation is intact.     Motor: Motor function is intact.     Coordination: Coordination is intact.     Gait: Gait is intact.  Psychiatric:        Behavior: Behavior is cooperative.      UC Treatments / Results  Labs (all labs ordered are listed, but only abnormal results are displayed) Labs Reviewed  AEROBIC CULTURE W GRAM STAIN (SUPERFICIAL SPECIMEN)    EKG   Radiology No results found.  Procedures Incision and Drainage  Date/Time: 10/10/2023 12:15 PM  Performed by: Maryruth Sol, FNP Authorized by: Maryruth Sol, FNP   Consent:    Consent obtained:  Verbal   Risks, benefits, and alternatives were discussed: yes     Risks discussed:  Bleeding, pain and infection Universal protocol:    Patient identity confirmed:  Verbally with patient and arm band Location:    Type:  Abscess   Location:  Trunk   Trunk location:  Abdomen Pre-procedure details:    Skin preparation:  Chlorhexidine with alcohol and antiseptic wash Anesthesia:    Anesthesia method:  Local infiltration   Local anesthetic:  Lidocaine  2% w/o epi Procedure type:    Complexity:  Complex Procedure details:  Wound management:  Probed and deloculated   Drainage:  Purulent   Drainage amount:  Moderate   Wound treatment:  Drain placed   Packing materials:  1/2 in iodoform gauze Post-procedure details:    Procedure completion:  Tolerated well, no immediate complications  (including critical care time)  Medications Ordered in UC Medications - No data to display  Initial Impression / Assessment and Plan / UC Course  I have reviewed the triage vital signs and the nursing notes.  Pertinent labs & imaging results that were available during my care of the  patient were reviewed by me and considered in my medical decision making (see chart for details).      48 year old female with a recurrent cutaneous abscess of the lower abdomen. Patient is afebrile and nontoxic on exam. Physical findings are consistent with a localized abscess without signs of systemic infection. Incision and drainage were performed in the clinic without complication. A wound culture was obtained and is currently pending. The patient was prescribed Bactrim DS for empiric antibiotic coverage and Motrin  for pain management. Detailed wound care instructions were provided, including signs of worsening infection and indications for immediate medical attention. She was advised to return to the clinic in 48 hours for packing removal and wound reassessment.  Today's evaluation has revealed no signs of a dangerous process. Discussed diagnosis with patient and/or guardian. Patient and/or guardian aware of their diagnosis, possible red flag symptoms to watch out for and need for close follow up. Patient and/or guardian understands verbal and written discharge instructions. Patient and/or guardian comfortable with plan and disposition.  Patient and/or guardian has a clear mental status at this time, good insight into illness (after discussion and teaching) and has clear judgment to make decisions regarding their care  Documentation was completed with the aid of voice recognition software. Transcription may contain typographical errors. Final Clinical Impressions(s) / UC Diagnoses   Final diagnoses:  Abscess of abdominal wall   Discharge Instructions   None    ED Prescriptions     Medication Sig Dispense Auth. Provider   sulfamethoxazole-trimethoprim (BACTRIM DS) 800-160 MG tablet Take 1 tablet by mouth 2 (two) times daily for 7 days. 14 tablet Maryruth Sol, FNP   ibuprofen  (ADVIL ) 800 MG tablet Take 1 tablet (800 mg total) by mouth every 8 (eight) hours as needed (pain). 21 tablet  Maryruth Sol, FNP      PDMP not reviewed this encounter.   Maryruth Sol, [place] 10/10/23 1301

## 2023-10-10 NOTE — ED Triage Notes (Signed)
 patient reports that she has an abscess to the lower abdominal wall. Patient states she was seen on 09/25/23 and was prescribed an antibiotic and did decrease, but now the abscess has returned.

## 2023-10-12 ENCOUNTER — Encounter (HOSPITAL_COMMUNITY): Payer: Self-pay

## 2023-10-12 ENCOUNTER — Ambulatory Visit (HOSPITAL_COMMUNITY)
Admission: RE | Admit: 2023-10-12 | Discharge: 2023-10-12 | Discharge status: Home / Self Care | Source: Ambulatory Visit | Attending: Internal Medicine | Admitting: Internal Medicine

## 2023-10-12 VITALS — BP 123/84 | HR 84 | Temp 97.8°F | Resp 22

## 2023-10-12 DIAGNOSIS — L0291 Cutaneous abscess, unspecified: Secondary | ICD-10-CM | POA: Diagnosis not present

## 2023-10-12 DIAGNOSIS — Z09 Encounter for follow-up examination after completed treatment for conditions other than malignant neoplasm: Secondary | ICD-10-CM | POA: Diagnosis not present

## 2023-10-12 DIAGNOSIS — Z5189 Encounter for other specified aftercare: Secondary | ICD-10-CM

## 2023-10-12 NOTE — Discharge Instructions (Addendum)
 Packing removed today.  Overall the wound is healthy appearing and there does not appear to be any signs of residual infection.  At this point recommend continuing the antibiotics as prescribed and the ibuprofen .  Recommend keeping a small amount of antibacterial ointment on the area and cover with a dry dressing at least once a day.  Wash the area with soap and water at least once daily.  Given the pain that is still present, recommend recheck in 2 to 3 days.  If you develop fevers or severe worsening of the pain in the area then recommend returning sooner.

## 2023-10-12 NOTE — ED Provider Notes (Signed)
 [facility]    CSN: [id number] Arrival date & time: 10/12/23  1126      History   Chief Complaint Chief Complaint  Patient presents with   Wound Check    HPI Whitney White is a 48 y.o. female.   48 year old female who presents urgent care needing a wound check after undergoing incision and drainage of a suprapubic abscess on Friday.  She reports that she is taking the Bactrim as directed.  She continues to have tenderness around the area and is using the ibuprofen  as prescribed.  She denies any fevers, chills, nausea, vomiting, abdominal pain in any other region or diarrhea.  She is here today to have the packing removed and for wound check.   Wound Check Pertinent negatives include no chest pain, no abdominal pain and no shortness of breath.    Past Medical History:  Diagnosis Date   Depression    Diabetes mellitus without complication (HCC)    "pre-diabetic"   Eczema    GERD (gastroesophageal reflux disease)    Gestational hypertension    Hypertension     Patient Active Problem List   Diagnosis Date Noted   Pain in right shoulder 01/03/2023   OSA (obstructive sleep apnea) 12/30/2022   Excessive daytime sleepiness 12/04/2022   Screening for cervical cancer 06/06/2022   Screening examination for STD (sexually transmitted disease) 06/06/2022   Vaginal bleeding 06/06/2022   Periodic limb movement 03/10/2017   RLS (restless legs syndrome) 01/26/2017   Mild diastolic dysfunction 12/06/2016   Prediabetes 11/26/2016   Hyperlipidemia 11/26/2016   Anxiety state 12/22/2013   Class 3 severe obesity with body mass index (BMI) of 50.0 to 59.9 in adult 12/22/2013   Chest pain 12/17/2013   Depression    Hypertension    Eczema    Gestational hypertension    GERD (gastroesophageal reflux disease)     Past Surgical History:  Procedure Laterality Date   NO PAST SURGERIES      OB History     Gravida  4   Para  4   Term  4   Preterm  0   AB  0    Living  4      SAB  0   IAB  0   Ectopic  0   Multiple  0   Live Births  1            Home Medications    Prior to Admission medications   Medication Sig Start Date End Date Taking? Authorizing Provider  albuterol  (VENTOLIN  HFA) 108 (90 Base) MCG/ACT inhaler Inhale 1-2 puffs into the lungs every 6 (six) hours as needed for wheezing or shortness of breath. 09/28/22   Lora Robinsons, FNP  amLODipine  (NORVASC ) 10 MG tablet Take 1 tablet (10 mg total) by mouth daily. 08/08/23   Lawrance Presume, MD  atorvastatin  (LIPITOR) 40 MG tablet Take 1 tablet (40 mg total) by mouth daily. 08/08/23   Lawrance Presume, MD  Blood Glucose Monitoring Suppl (ACCU-CHEK GUIDE ME) w/Device KIT UAD 06/20/22   Lawrance Presume, MD  busPIRone (BUSPAR) 15 MG tablet Take 15 mg by mouth 2 (two) times daily. 09/03/23   [provider]  empagliflozin  (JARDIANCE ) 25 MG TABS tablet Take 1 tablet (25 mg total) by mouth daily before breakfast. 08/08/23   Lawrance Presume, MD  escitalopram  (LEXAPRO ) 20 MG tablet Take 1 tablet (20 mg) by mouth daily. 02/14/23   Lawrance Presume, MD  gabapentin  (NEURONTIN ) 300 MG capsule Take 1 capsule (300 mg total) by mouth at bedtime. 08/08/23   Lawrance Presume, MD  glucose blood (ACCU-CHEK GUIDE) test strip Check BS once a day 06/20/22   Lawrance Presume, MD  hydrochlorothiazide  (HYDRODIURIL ) 25 MG tablet Take 1 tablet (25 mg total) by mouth daily. 08/08/23   Lawrance Presume, MD  hydrOXYzine  (ATARAX /VISTARIL ) 25 MG tablet Take 12.5 mg by mouth 2 (two) times daily as needed. 10/02/20   [provider]  ibuprofen  (ADVIL ) 800 MG tablet Take 1 tablet (800 mg total) by mouth every 8 (eight) hours as needed (pain). 10/10/23   Maryruth Sol, FNP  Lancets Misc. (ACCU-CHEK FASTCLIX LANCET) KIT UAD 06/20/22   Lawrance Presume, MD  levonorgestrel  (MIRENA ) 20 MCG/24HR IUD 1 each by Intrauterine route once.    [provider]  Semaglutide ,0.25 or 0.5MG /DOS,  (OZEMPIC , 0.25 OR 0.5 MG/DOSE,) 2 MG/3ML SOPN INJECT 0.5 MG INTO THE SKIN ONE TIME PER WEEK 10/01/23   Lawrance Presume, MD  Spacer/Aero-Holding Idelle Majors DEVI 1 Units by Does not apply route in the morning, at noon, and at bedtime. 06/20/22   Gretel Leaven, PA-C  sulfamethoxazole-trimethoprim (BACTRIM DS) 800-160 MG tablet Take 1 tablet by mouth 2 (two) times daily for 7 days. 10/10/23 10/17/23  Maryruth Sol, FNP  triamcinolone  (KENALOG ) 0.025 % ointment APPLY TO AFFECTED AREA TWICE A DAY 11/08/22   Lawrance Presume, MD  TRUEplus Lancets 28G MISC Use as directed 12/27/19   Senaida Dama, NP    Family History Family History  Problem Relation Age of Onset   Hypertension Mother    Stroke Mother    Diabetes Maternal Grandmother    Hypertension Maternal Grandmother    Cancer Maternal Aunt        breast cancer    Anesthesia problems Neg Hx     Social History Social History   Tobacco Use   Smoking status: Former    Types: Cigarettes   Smokeless tobacco: Never  Vaping Use   Vaping status: Never Used  Substance Use Topics   Alcohol use: Yes    Comment: occaisonally   Drug use: No     Allergies   Lisinopril  and Metformin  and related   Review of Systems Review of Systems  Constitutional:  Negative for chills and fever.  HENT:  Negative for ear pain and sore throat.   Eyes:  Negative for pain and visual disturbance.  Respiratory:  Negative for cough and shortness of breath.   Cardiovascular:  Negative for chest pain and palpitations.  Gastrointestinal:  Negative for abdominal pain and vomiting.  Genitourinary:  Negative for dysuria and hematuria.  Musculoskeletal:  Negative for arthralgias and back pain.  Skin:  Positive for wound (Suprapubic region). Negative for color change and rash.  Neurological:  Negative for seizures and syncope.  All other systems reviewed and are negative.    Physical Exam Triage Vital Signs ED Triage Vitals  Encounter Vitals Group     BP  10/12/23 1146 123/84     Systolic BP Percentile --      Diastolic BP Percentile --      Pulse Rate 10/12/23 1146 84     Resp 10/12/23 1146 (!) 22     Temp 10/12/23 1146 97.8 F (36.6 C)     Temp Source 10/12/23 1146 Oral     SpO2 10/12/23 1146 93 %     Weight --      Height --  Head Circumference --      Peak Flow --      Pain Score 10/12/23 1145 7     Pain Loc --      Pain Education --      Exclude from Growth Chart --    No data found.  Updated Vital Signs BP 123/84 (BP Location: Right Arm)   Pulse 84   Temp 97.8 F (36.6 C) (Oral)   Resp (!) 22   LMP 08/24/2023 (Approximate)   SpO2 93%   Visual Acuity Right Eye Distance:   Left Eye Distance:   Bilateral Distance:    Right Eye Near:   Left Eye Near:    Bilateral Near:     Physical Exam Vitals and nursing note reviewed.  Constitutional:      General: She is not in acute distress.    Appearance: She is well-developed.  HENT:     Head: Normocephalic and atraumatic.  Eyes:     Conjunctiva/sclera: Conjunctivae normal.  Cardiovascular:     Rate and Rhythm: Normal rate and regular rhythm.     Heart sounds: No murmur heard. Pulmonary:     Effort: Pulmonary effort is normal. No respiratory distress.  Abdominal:     Palpations: Abdomen is soft.     Tenderness: There is abdominal tenderness (Suprapubic in the region of the open wound).  Musculoskeletal:        General: No swelling.     Cervical back: Neck supple.  Skin:    General: Skin is warm and dry.     Capillary Refill: Capillary refill takes less than 2 seconds.       Neurological:     Mental Status: She is alert.  Psychiatric:        Mood and Affect: Mood normal.      UC Treatments / Results  Labs (all labs ordered are listed, but only abnormal results are displayed) Labs Reviewed - No data to display  EKG   Radiology No results found.  Procedures Procedures (including critical care time)  Medications Ordered in UC Medications -  No data to display  Initial Impression / Assessment and Plan / UC Course  I have reviewed the triage vital signs and the nursing notes.  Pertinent labs & imaging results that were available during my care of the patient were reviewed by me and considered in my medical decision making (see chart for details).     Visit for wound check  Encounter for recheck of abscess following incision and drainage  Abscess   Overall the wound is healthy and the surrounding induration and erythema has resolved.  The culture is still pending final results.  The patient continues to have a fair amount of pain in the area and given this we will have her return in 2 days for recheck.  Recommend antibacterial ointment on the wound and cover with a dry dressing.  She should changes at least once a day and wash the area with soap and water daily.  Advised the patient that if she develops fevers or severe worsening of the pain then she should return sooner.  Continue Bactrim and ibuprofen .  Final Clinical Impressions(s) / UC Diagnoses   Final diagnoses:  None   Discharge Instructions   None    ED Prescriptions   None    PDMP not reviewed this encounter.   Kreg Pesa, [place] 10/12/23 1214

## 2023-10-12 NOTE — ED Triage Notes (Signed)
 Pt had abscess in under-fold of abdomen lanced and packed few days ago, told to come in for packing removal and wound check.

## 2023-10-13 LAB — AEROBIC CULTURE W GRAM STAIN (SUPERFICIAL SPECIMEN)

## 2023-10-14 ENCOUNTER — Ambulatory Visit (HOSPITAL_COMMUNITY): Payer: Self-pay

## 2023-10-14 DIAGNOSIS — F411 Generalized anxiety disorder: Secondary | ICD-10-CM | POA: Diagnosis not present

## 2023-10-14 DIAGNOSIS — R4581 Low self-esteem: Secondary | ICD-10-CM | POA: Diagnosis not present

## 2023-10-14 DIAGNOSIS — Z630 Problems in relationship with spouse or partner: Secondary | ICD-10-CM | POA: Diagnosis not present

## 2023-10-15 ENCOUNTER — Other Ambulatory Visit: Payer: Self-pay

## 2023-10-22 ENCOUNTER — Ambulatory Visit
Admission: RE | Admit: 2023-10-22 | Discharge: 2023-10-22 | Disposition: A | Discharge status: Home / Self Care | Source: Ambulatory Visit | Attending: Physician Assistant

## 2023-10-22 ENCOUNTER — Emergency Department (HOSPITAL_COMMUNITY)

## 2023-10-22 ENCOUNTER — Other Ambulatory Visit: Payer: Self-pay

## 2023-10-22 ENCOUNTER — Encounter (HOSPITAL_COMMUNITY): Payer: Self-pay

## 2023-10-22 ENCOUNTER — Emergency Department (HOSPITAL_COMMUNITY)
Admission: EM | Admit: 2023-10-22 | Discharge: 2023-10-22 | Disposition: A | Discharge status: Home / Self Care | Attending: Emergency Medicine | Admitting: Emergency Medicine

## 2023-10-22 VITALS — BP 114/80 | HR 93 | Temp 99.0°F | Resp 22 | Ht 62.0 in | Wt 285.0 lb

## 2023-10-22 DIAGNOSIS — M4802 Spinal stenosis, cervical region: Secondary | ICD-10-CM | POA: Diagnosis not present

## 2023-10-22 DIAGNOSIS — R590 Localized enlarged lymph nodes: Secondary | ICD-10-CM | POA: Diagnosis not present

## 2023-10-22 DIAGNOSIS — I10 Essential (primary) hypertension: Secondary | ICD-10-CM | POA: Insufficient documentation

## 2023-10-22 DIAGNOSIS — J020 Streptococcal pharyngitis: Secondary | ICD-10-CM | POA: Diagnosis not present

## 2023-10-22 DIAGNOSIS — J392 Other diseases of pharynx: Secondary | ICD-10-CM | POA: Diagnosis not present

## 2023-10-22 DIAGNOSIS — R221 Localized swelling, mass and lump, neck: Secondary | ICD-10-CM

## 2023-10-22 DIAGNOSIS — Z79899 Other long term (current) drug therapy: Secondary | ICD-10-CM | POA: Insufficient documentation

## 2023-10-22 DIAGNOSIS — J029 Acute pharyngitis, unspecified: Secondary | ICD-10-CM

## 2023-10-22 DIAGNOSIS — R6883 Chills (without fever): Secondary | ICD-10-CM | POA: Diagnosis not present

## 2023-10-22 LAB — CBC WITH DIFFERENTIAL/PLATELET
Abs Immature Granulocytes: 0.07 10*3/uL (ref 0.00–0.07)
Basophils Absolute: 0 10*3/uL (ref 0.0–0.1)
Basophils Relative: 0 %
Eosinophils Absolute: 0 10*3/uL (ref 0.0–0.5)
Eosinophils Relative: 0 %
HCT: 46.1 % — ABNORMAL HIGH (ref 36.0–46.0)
Hemoglobin: 14 g/dL (ref 12.0–15.0)
Immature Granulocytes: 1 %
Lymphocytes Relative: 18 %
Lymphs Abs: 2.5 10*3/uL (ref 0.7–4.0)
MCH: 24.3 pg — ABNORMAL LOW (ref 26.0–34.0)
MCHC: 30.4 g/dL (ref 30.0–36.0)
MCV: 80.2 fL (ref 80.0–100.0)
Monocytes Absolute: 1 10*3/uL (ref 0.1–1.0)
Monocytes Relative: 7 %
Neutro Abs: 10.3 10*3/uL — ABNORMAL HIGH (ref 1.7–7.7)
Neutrophils Relative %: 74 %
Platelets: 354 10*3/uL (ref 150–400)
RBC: 5.75 MIL/uL — ABNORMAL HIGH (ref 3.87–5.11)
RDW: 15.3 % (ref 11.5–15.5)
WBC: 13.9 10*3/uL — ABNORMAL HIGH (ref 4.0–10.5)
nRBC: 0 % (ref 0.0–0.2)

## 2023-10-22 LAB — BASIC METABOLIC PANEL WITH GFR
Anion gap: 10 (ref 5–15)
BUN: 6 mg/dL (ref 6–20)
CO2: 29 mmol/L (ref 22–32)
Calcium: 9 mg/dL (ref 8.9–10.3)
Chloride: 97 mmol/L — ABNORMAL LOW (ref 98–111)
Creatinine, Ser: 0.67 mg/dL (ref 0.44–1.00)
GFR, Estimated: >60 mL/min (ref >60)
Glucose, Bld: 104 mg/dL — ABNORMAL HIGH (ref 70–99)
Potassium: 3 mmol/L — ABNORMAL LOW (ref 3.5–5.1)
Sodium: 136 mmol/L (ref 135–145)

## 2023-10-22 LAB — HCG, SERUM, QUALITATIVE: Preg, Serum: NEGATIVE

## 2023-10-22 LAB — MONONUCLEOSIS SCREEN: Mono Screen: NEGATIVE

## 2023-10-22 LAB — POCT RAPID STREP A (OFFICE): Rapid Strep A Screen: NEGATIVE

## 2023-10-22 LAB — GROUP A STREP BY PCR: Group A Strep by PCR: DETECTED — AB

## 2023-10-22 MED ORDER — ACETAMINOPHEN 500 MG PO TABS
1000.0000 mg | ORAL_TABLET | Freq: Once | ORAL | Status: AC
  Administered 2023-10-22: 1000 mg via ORAL
  Filled 2023-10-22: qty 2

## 2023-10-22 MED ORDER — PREDNISONE 20 MG PO TABS
40.0000 mg | ORAL_TABLET | Freq: Every day | ORAL | 0 refills | Status: AC
Start: 2023-10-22 — End: 2023-10-27

## 2023-10-22 MED ORDER — POTASSIUM CHLORIDE CRYS ER 20 MEQ PO TBCR
40.0000 meq | EXTENDED_RELEASE_TABLET | Freq: Once | ORAL | Status: AC
  Administered 2023-10-22: 40 meq via ORAL
  Filled 2023-10-22: qty 2

## 2023-10-22 MED ORDER — AMOXICILLIN 500 MG PO CAPS: 500.0000 mg | ORAL_CAPSULE | Freq: Two times a day (BID) | ORAL | 0 refills | Status: AC

## 2023-10-22 MED ORDER — LIDOCAINE VISCOUS HCL 2 % MT SOLN
15.0000 mL | Freq: Once | OROMUCOSAL | Status: AC
  Administered 2023-10-22: 15 mL via OROMUCOSAL
  Filled 2023-10-22: qty 15

## 2023-10-22 MED ORDER — IOHEXOL 300 MG/ML  SOLN
75.0000 mL | Freq: Once | INTRAMUSCULAR | Status: AC | PRN
  Administered 2023-10-22: 100 mL via INTRAVENOUS

## 2023-10-22 NOTE — Discharge Instructions (Addendum)
 As discussed, you are positive for strep throat.  Your CT results also reflect this as well.  Take amoxicillin  500 mg twice a day for the next 10 days.  It takes about 2 to 3 days for the antibiotics to kick in.  Please take full course of antibiotics to ensure resolution of infection.  Change her toothbrush so you do not reinfect yourself.  I have also a prescription of prednisone  to your pharmacy you can take 40 mg once a day for the next 5 days to help with throat swelling.  Additionally, you can alternate between ibuprofen  600 mg and Tylenol  650 mg every 4 hours as needed for pain.  Follow-up the primary care in the next week for reevaluation of your symptoms.  Get help right away if: You have new symptoms, such as vomiting, severe headache, stiff or painful neck, chest pain, or shortness of breath. You have severe throat pain, drooling, or changes in your voice. You have swelling of the neck, or the skin on the neck becomes red and tender. You have signs of dehydration, such as tiredness (fatigue), dry mouth, and decreased urination. You become increasingly sleepy, or you cannot wake up completely. Your joints become red or painful.

## 2023-10-22 NOTE — ED Triage Notes (Signed)
 Pt arrived with complaints of throat soreness and swelling that started yesterday suddenly with increased saliva. Denies fever, cough or any other symptoms. Pt voice also sounds muffled. Reports she recently completed 7 days of bactrim  for Abscess. Sent from [facility]

## 2023-10-22 NOTE — ED Triage Notes (Signed)
 Pt presents with complaints of sore throat, difficulty swallowing, chills, decrease in appetite, and generalized body aches x 1 day. Unsure of fevers at home, felt warm but did not have a working thermometer. Pt took a 800 mg Ibuprofen  at 0700 today. Pt currently rates her overall pain a 5/10.

## 2023-10-22 NOTE — ED Provider Triage Note (Signed)
 Emergency Medicine Provider Triage Evaluation Note  Whitney White , a 48 y.o. female  was evaluated in triage.  Pt complains of sore throat.  Painful swallowing since yesterday  Seen at [facility] and sent into ED with concern for soft palate swelling  Patient reports finishing course of bactrim  and doxycycline  for lower body abscess on Monday, 2 days ago.  Patient doesn't feel neck is swollen, "I have a big neck." Denies lip swelling or hx of angioedema  Review of Systems  Positive: Sore throat Negative: Neck swelling, fever  Physical Exam  BP (!) 138/98 (BP Location: Right Arm)   Pulse 84   Temp 98.8 F (37.1 C) (Oral)   Resp 20   Ht 5\' 2"  (1.575 m)   Wt 129 kg   LMP 08/24/2023 (Approximate)   SpO2 100%   BMI 52.02 kg/m  Gen:   Awake, no distress; voice is hoarse (pt reports this is normal for her); obese neck, no stridor; uvula shows some swelling, no PTA swelling, no muffled speech, no tongue or lip swelling, no submandibular swelling Resp:  Normal effort  MSK:   Moves extremities without difficulty    Medical Decision Making  Medically screening exam initiated at 11:34 AM.  Appropriate orders placed.  Whitney White was informed that the remainder of the evaluation will be completed by another provider, this initial triage assessment does not replace that evaluation, and the importance of remaining in the ED until their evaluation is complete.  Labs ordered CT soft tissue neck to evaluate for possible deep space infection  Airway patent on initial exam   [name], MD 10/22/23 1136

## 2023-10-22 NOTE — ED Notes (Signed)
 Patient is being discharged from the Urgent Care and sent to the Emergency Department via private vehicle . Per Cleveland Dales Mecum PA, patient is in need of higher level of care due to pain and swelling in throat. Patient is aware and verbalizes understanding of plan of care.  Vitals:   10/22/23 0854  BP: 114/80  Pulse: 93  Resp: (!) 22  Temp: 99 F (37.2 C)  SpO2: 92%

## 2023-10-22 NOTE — ED Provider Notes (Signed)
 Patient care taken over at shift change.  Disposition pending lab and imaging results.  If all reassuring should be safe for discharge home with steroids.  Physical Exam  BP (!) 164/100 (BP Location: Right Arm)   Pulse 86   Temp 99.1 F (37.3 C) (Oral)   Resp 18   Ht 5\' 2"  (1.575 m)   Wt 129 kg   LMP 08/24/2023 (Approximate)   SpO2 97%   BMI 52.02 kg/m   Physical Exam  Procedures  Procedures  ED Course / MDM    Medical Decision Making Amount and/or Complexity of Data Reviewed Labs: ordered. Radiology: ordered.  Risk OTC drugs. Prescription drug management.   Negative monotest.  Strep test positive.  CT of the neck shows tonsillitis but no acute findings.  Discussed findings with patient.  All questions were answered.  Prescription for amoxicillin  and prednisone  sent to the pharmacy.  Return precautions provided.   Sonnie Dusky, PA-C 10/22/23 1636    Long, Shereen Dike, MD 10/23/23 [phone]

## 2023-10-22 NOTE — ED Provider Notes (Signed)
 [place] EMERGENCY DEPARTMENT AT [facility] Provider Note   CSN: [id number] Arrival date & time: 10/22/23  1056     History  Chief Complaint  Patient presents with   Sore Throat   Generalized Body Aches    Whitney White is a 48 y.o. female.  The history is provided by the patient and medical records. No language interpreter was used.  Sore Throat     48 year old female history of hypertension, prediabetes, GERD, OSA sent here from urgent care center with complaints of sore throat.  For the past 2 days patient has had sore throat, fever, chills, body aches, and decrease in appetite.  Increasing pain with swallowing and she was having difficulty to swallow her saliva.  She has tried taking ibuprofen  at home but states it does not provide adequate relief.  She also previously finished a course of antibiotic including doxycycline  and Bactrim  for a skin abscess.  She finished antibiotics 2 days ago.  She was initially seen and evaluated in the urgent care center and was sent here for further evaluation of her symptoms.  Home Medications Prior to Admission medications   Medication Sig Start Date End Date Taking? Authorizing Provider  albuterol  (VENTOLIN  HFA) 108 (90 Base) MCG/ACT inhaler Inhale 1-2 puffs into the lungs every 6 (six) hours as needed for wheezing or shortness of breath. 09/28/22   Lora Robinsons, FNP  amLODipine  (NORVASC ) 10 MG tablet Take 1 tablet (10 mg total) by mouth daily. 08/08/23   Lawrance Presume, MD  atorvastatin  (LIPITOR) 40 MG tablet Take 1 tablet (40 mg total) by mouth daily. 08/08/23   Lawrance Presume, MD  Blood Glucose Monitoring Suppl (ACCU-CHEK GUIDE ME) w/Device KIT UAD 06/20/22   Lawrance Presume, MD  busPIRone (BUSPAR) 15 MG tablet Take 15 mg by mouth 2 (two) times daily. 09/03/23   [provider]  empagliflozin  (JARDIANCE ) 25 MG TABS tablet Take 1 tablet (25 mg total) by mouth daily before breakfast. 08/08/23   Lawrance Presume,  MD  escitalopram  (LEXAPRO ) 20 MG tablet Take 1 tablet (20 mg) by mouth daily. 02/14/23   Lawrance Presume, MD  gabapentin  (NEURONTIN ) 300 MG capsule Take 1 capsule (300 mg total) by mouth at bedtime. 08/08/23   Lawrance Presume, MD  glucose blood (ACCU-CHEK GUIDE) test strip Check BS once a day 06/20/22   Lawrance Presume, MD  hydrochlorothiazide  (HYDRODIURIL ) 25 MG tablet Take 1 tablet (25 mg total) by mouth daily. 08/08/23   Lawrance Presume, MD  hydrOXYzine  (ATARAX /VISTARIL ) 25 MG tablet Take 12.5 mg by mouth 2 (two) times daily as needed. 10/02/20   [provider]  ibuprofen  (ADVIL ) 800 MG tablet Take 1 tablet (800 mg total) by mouth every 8 (eight) hours as needed (pain). 10/10/23   Maryruth Sol, FNP  Lancets Misc. (ACCU-CHEK FASTCLIX LANCET) KIT UAD 06/20/22   Lawrance Presume, MD  levonorgestrel  (MIRENA ) 20 MCG/24HR IUD 1 each by Intrauterine route once.    [provider]  Semaglutide ,0.25 or 0.5MG /DOS, (OZEMPIC , 0.25 OR 0.5 MG/DOSE,) 2 MG/3ML SOPN INJECT 0.5 MG INTO THE SKIN ONE TIME PER WEEK 10/01/23   Lawrance Presume, MD  Spacer/Aero-Holding Idelle Majors DEVI 1 Units by Does not apply route in the morning, at noon, and at bedtime. 06/20/22   Gretel Leaven, PA-C  triamcinolone  (KENALOG ) 0.025 % ointment APPLY TO AFFECTED AREA TWICE A DAY 11/08/22   Lawrance Presume, MD  TRUEplus Lancets 28G MISC Use as directed  12/27/19   Senaida Dama, NP      Allergies    Lisinopril  and Metformin  and related    Review of Systems   Review of Systems  All other systems reviewed and are negative.   Physical Exam Updated Vital Signs BP (!) 138/98 (BP Location: Right Arm)   Pulse 84   Temp 98.8 F (37.1 C) (Oral)   Resp 20   Ht 5\' 2"  (1.575 m)   Wt 129 kg   LMP 08/24/2023 (Approximate)   SpO2 100%   BMI 52.02 kg/m  Physical Exam Vitals and nursing note reviewed.  Constitutional:      General: She is not in acute distress.    Appearance: She is well-developed. She is  obese.  HENT:     Head: Atraumatic.     Mouth/Throat:     Comments: Some mild fullness of the posterior oropharyngeal region without any tongue swelling or sublingual swelling.  No stridor or trismus noted. Eyes:     Conjunctiva/sclera: Conjunctivae normal.  Cardiovascular:     Rate and Rhythm: Normal rate and regular rhythm.     Pulses: Normal pulses.     Heart sounds: Normal heart sounds.  Pulmonary:     Effort: Pulmonary effort is normal.  Musculoskeletal:     Cervical back: Normal range of motion and neck supple. No rigidity.  Lymphadenopathy:     Cervical: Cervical adenopathy present.  Skin:    Findings: No rash.  Neurological:     Mental Status: She is alert.  Psychiatric:        Mood and Affect: Mood normal.     ED Results / Procedures / Treatments   Labs (all labs ordered are listed, but only abnormal results are displayed) Labs Reviewed  CBC WITH DIFFERENTIAL/PLATELET - Abnormal; Notable for the following components:      Result Value   WBC 13.9 (*)    RBC 5.75 (*)    HCT 46.1 (*)    MCH 24.3 (*)    Neutro Abs 10.3 (*)    All other components within normal limits  BASIC METABOLIC PANEL WITH GFR - Abnormal; Notable for the following components:   Potassium 3.0 (*)    Chloride 97 (*)    Glucose, Bld 104 (*)    All other components within normal limits  HCG, SERUM, QUALITATIVE    EKG None  Radiology No results found.  Procedures Procedures    Medications Ordered in ED Medications  acetaminophen  (TYLENOL ) tablet 1,000 mg (has no administration in time range)  potassium chloride  SA (KLOR-CON  M) CR tablet 40 mEq (has no administration in time range)  iohexol  (OMNIPAQUE ) 300 MG/ML solution 75 mL (100 mLs Intravenous Contrast Given 10/22/23 1335)  lidocaine  (XYLOCAINE ) 2 % viscous mouth solution 15 mL (15 mLs Mouth/Throat Given 10/22/23 1435)    ED Course/ Medical Decision Making/ A&P                                 Medical Decision Making Amount  and/or Complexity of Data Reviewed Labs: ordered. Radiology: ordered.  Risk OTC drugs. Prescription drug management.   BP (!) 164/100 (BP Location: Right Arm)   Pulse 86   Temp 99.1 F (37.3 C) (Oral)   Resp 18   Ht 5\' 2"  (1.575 m)   Wt 129 kg   LMP 08/24/2023 (Approximate)   SpO2 97%   BMI 52.02 kg/m   2:40  PM 48 year old female history of hypertension, prediabetes, GERD, OSA sent here from urgent care center with complaints of sore throat.  For the past 2 days patient has had sore throat, fever, chills, body aches, and decrease in appetite.  Increasing pain with swallowing and she was having difficulty to swallow her saliva.  She has tried taking ibuprofen  at home but states it does not provide adequate relief.  She also previously finished a course of antibiotic including doxycycline  and Bactrim  for a skin abscess.  She finished antibiotics 2 days ago.  She was initially seen and evaluated in the urgent care center and was sent here for further evaluation of her symptoms.  On exam this is an obese female resting comfortably appears to be in no acute discomfort.  Throat exam notable for some mild swelling to her tonsillar pillars.  She also has tenderness noted to her anterior neck but exam is difficult due to her large body habitus.  -Labs ordered, independently viewed and interpreted by me.  Labs remarkable for elevated count of 13.9.  Potassium of 3.0, supplementation given -The patient was maintained on a cardiac monitor.  I personally viewed and interpreted the cardiac monitored which showed an underlying rhythm of: sinus rhythm -Imaging including CT soft tissue neck is currently pending -This patient presents to the ED for concern of sore throat, this involves an extensive number of treatment options, and is a complaint that carries with it a high risk of complications and morbidity.  The differential diagnosis includes strep, mono, PTA, RPA, Lemiere's disease, Ludwigs angina,  thyromegaly, angioedema from drugs allergy -Co morbidities that complicate the patient evaluation includes HTN, GERD, DM -Treatment includes viscous lido, tylenol  -Reevaluation of the patient after these medicines showed that the patient improved -PCP office notes or outside notes reviewed -Discussion with oncoming provider who will f/u on strep/mono and CT scan and determine disposition -Escalation to admission/observation considered: dispo pending.          Final Clinical Impression(s) / ED Diagnoses Final diagnoses:  None    Rx / DC Orders ED Discharge Orders     None         Debbra Fairy, PA-C 10/22/23 1530    Gordon Latus Janalyn Me, MD 10/22/23 1535

## 2023-10-22 NOTE — Discharge Instructions (Signed)
 Your strep testing was negative.  At this time I am concerned that you may be having a severe condition called angioedema. Given the fact that you are having difficulty swallowing and report increased saliva production I am concerned that you may be developing some difficulty with managing your saliva and airway. This will need to be managed in the ED with medications and observation.  You have declined transportation by EMS so I recommend that you go to the nearest emergency room immediately for further evaluation and ongoing management.  If any point you feel like you cannot make it to the emergency room please pull over and call 911 for further assistance.

## 2023-10-22 NOTE — ED Provider Notes (Signed)
 [name]    CSN: [id number] Arrival date & time: 10/22/23  0847      History   Chief Complaint Chief Complaint  Patient presents with   Sore Throat    Continuous throat pain - Entered by patient    HPI Whitney White is a 48 y.o. female.   HPI  She reports concerns of sore throat, chills, body aches since yesterday She states symptoms started suddenly with sore throat and then she developed chills and body aches She reports severe pain with swallowing  She reports she has been producing a lot of saliva and feels like she cannot swallow this due to pain   Interventions: Ibuprofen  800 mg - took one dose and reports she only had relief for about 4 hours She denies recent sick contacts She has finished taking Abx for abscess (doxycycline  and Bactrim ) she finished the second abx on Monday    Past Medical History:  Diagnosis Date   Depression    Diabetes mellitus without complication (HCC)    "pre-diabetic"   Eczema    GERD (gastroesophageal reflux disease)    Gestational hypertension    Hypertension     Patient Active Problem List   Diagnosis Date Noted   Pain in right shoulder 01/03/2023   OSA (obstructive sleep apnea) 12/30/2022   Excessive daytime sleepiness 12/04/2022   Screening for cervical cancer 06/06/2022   Screening examination for STD (sexually transmitted disease) 06/06/2022   Vaginal bleeding 06/06/2022   Periodic limb movement 03/10/2017   RLS (restless legs syndrome) 01/26/2017   Mild diastolic dysfunction 12/06/2016   Prediabetes 11/26/2016   Hyperlipidemia 11/26/2016   Anxiety state 12/22/2013   Class 3 severe obesity with body mass index (BMI) of 50.0 to 59.9 in adult 12/22/2013   Chest pain 12/17/2013   Depression    Hypertension    Eczema    Gestational hypertension    GERD (gastroesophageal reflux disease)     Past Surgical History:  Procedure Laterality Date   NO PAST SURGERIES      OB History     Gravida  4    Para  4   Term  4   Preterm  0   AB  0   Living  4      SAB  0   IAB  0   Ectopic  0   Multiple  0   Live Births  1            Home Medications    Prior to Admission medications   Medication Sig Start Date End Date Taking? Authorizing Provider  albuterol  (VENTOLIN  HFA) 108 (90 Base) MCG/ACT inhaler Inhale 1-2 puffs into the lungs every 6 (six) hours as needed for wheezing or shortness of breath. 09/28/22   Lora Robinsons, FNP  amLODipine  (NORVASC ) 10 MG tablet Take 1 tablet (10 mg total) by mouth daily. 08/08/23   Lawrance Presume, MD  atorvastatin  (LIPITOR) 40 MG tablet Take 1 tablet (40 mg total) by mouth daily. 08/08/23   Lawrance Presume, MD  Blood Glucose Monitoring Suppl (ACCU-CHEK GUIDE ME) w/Device KIT UAD 06/20/22   Lawrance Presume, MD  busPIRone (BUSPAR) 15 MG tablet Take 15 mg by mouth 2 (two) times daily. 09/03/23   [provider]  empagliflozin  (JARDIANCE ) 25 MG TABS tablet Take 1 tablet (25 mg total) by mouth daily before breakfast. 08/08/23   Lawrance Presume, MD  escitalopram  (LEXAPRO ) 20 MG tablet Take 1 tablet (20  mg) by mouth daily. 02/14/23   Lawrance Presume, MD  gabapentin  (NEURONTIN ) 300 MG capsule Take 1 capsule (300 mg total) by mouth at bedtime. 08/08/23   Lawrance Presume, MD  glucose blood (ACCU-CHEK GUIDE) test strip Check BS once a day 06/20/22   Lawrance Presume, MD  hydrochlorothiazide  (HYDRODIURIL ) 25 MG tablet Take 1 tablet (25 mg total) by mouth daily. 08/08/23   Lawrance Presume, MD  hydrOXYzine  (ATARAX /VISTARIL ) 25 MG tablet Take 12.5 mg by mouth 2 (two) times daily as needed. 10/02/20   [provider]  ibuprofen  (ADVIL ) 800 MG tablet Take 1 tablet (800 mg total) by mouth every 8 (eight) hours as needed (pain). 10/10/23   Maryruth Sol, FNP  Lancets Misc. (ACCU-CHEK FASTCLIX LANCET) KIT UAD 06/20/22   Lawrance Presume, MD  levonorgestrel  (MIRENA ) 20 MCG/24HR IUD 1 each by Intrauterine route once.    [provider]  Semaglutide ,0.25 or 0.5MG /DOS, (OZEMPIC , 0.25 OR 0.5 MG/DOSE,) 2 MG/3ML SOPN INJECT 0.5 MG INTO THE SKIN ONE TIME PER WEEK 10/01/23   Lawrance Presume, MD  Spacer/Aero-Holding Idelle Majors DEVI 1 Units by Does not apply route in the morning, at noon, and at bedtime. 06/20/22   Gretel Leaven, PA-C  triamcinolone  (KENALOG ) 0.025 % ointment APPLY TO AFFECTED AREA TWICE A DAY 11/08/22   Lawrance Presume, MD  TRUEplus Lancets 28G MISC Use as directed 12/27/19   Senaida Dama, NP    Family History Family History  Problem Relation Age of Onset   Hypertension Mother    Stroke Mother    Diabetes Maternal Grandmother    Hypertension Maternal Grandmother    Cancer Maternal Aunt        breast cancer    Anesthesia problems Neg Hx     Social History Social History   Tobacco Use   Smoking status: Former    Types: Cigarettes   Smokeless tobacco: Never  Vaping Use   Vaping status: Never Used  Substance Use Topics   Alcohol use: Yes    Comment: occaisonally   Drug use: No     Allergies   Lisinopril  and Metformin  and related   Review of Systems Review of Systems  Constitutional:  Positive for chills and fever.  HENT:  Positive for sore throat and trouble swallowing. Negative for congestion, ear pain, postnasal drip, rhinorrhea, sinus pressure and sinus pain.   Respiratory:  Negative for cough, shortness of breath and wheezing.   Musculoskeletal:  Positive for myalgias and neck pain (anterior neck pain).     Physical Exam Triage Vital Signs ED Triage Vitals [10/22/23 0854]  Encounter Vitals Group     BP 114/80     Systolic BP Percentile      Diastolic BP Percentile      Pulse Rate 93     Resp (!) 22     Temp 99 F (37.2 C)     Temp Source Oral     SpO2 92 %     Weight 285 lb (129.3 kg)     Height 5\' 2"  (1.575 m)     Head Circumference      Peak Flow      Pain Score      Pain Loc      Pain Education      Exclude from Growth Chart    No data  found.  Updated Vital Signs BP 114/80 (BP Location: Right Arm)   Pulse 93   Temp 99 F (37.2  C) (Oral)   Resp (!) 22   Ht 5\' 2"  (1.575 m)   Wt 285 lb (129.3 kg)   LMP 08/24/2023 (Approximate)   SpO2 92%   BMI 52.13 kg/m   Visual Acuity Right Eye Distance:   Left Eye Distance:   Bilateral Distance:    Right Eye Near:   Left Eye Near:    Bilateral Near:     Physical Exam Vitals reviewed.  Constitutional:      General: She is awake. She is not in acute distress.    Appearance: Normal appearance. She is well-developed. She is obese. She is ill-appearing. She is not toxic-appearing or diaphoretic.     Comments: Patient is a pleasant 48 year old female who appears stated age.  She is seated comfortably in exam room and does appear slightly ill-appearing but does not appear to be in acute distress  HENT:     Head: Normocephalic and atraumatic.     Mouth/Throat:     Lips: Pink.     Mouth: Mucous membranes are moist.     Comments: Soft palate is notably swollen.  I am unable to fully make out tonsillar pillars or posterior pharynx for evaluation due to decreased airway opening. Eyes:     General: Lids are normal. Gaze aligned appropriately.     Conjunctiva/sclera: Conjunctivae normal.  Neck:     Trachea: Tracheal tenderness present.   Cardiovascular:     Rate and Rhythm: Normal rate and regular rhythm.     Heart sounds: Normal heart sounds. No murmur heard.    No friction rub. No gallop.  Pulmonary:     Effort: Pulmonary effort is normal.     Breath sounds: Normal breath sounds. No decreased air movement. No decreased breath sounds, wheezing, rhonchi or rales.  Musculoskeletal:     Cervical back: Normal range of motion. Edema present.  Neurological:     General: No focal deficit present.     Mental Status: She is alert and oriented to person, place, and time.  Psychiatric:        Mood and Affect: Mood normal.        Behavior: Behavior normal. Behavior is cooperative.         Thought Content: Thought content normal.        Judgment: Judgment normal.      UC Treatments / Results  Labs (all labs ordered are listed, but only abnormal results are displayed) Labs Reviewed  POCT RAPID STREP A (OFFICE) - Normal    EKG   Radiology No results found.  Procedures Procedures (including critical care time)  Medications Ordered in UC Medications - No data to display  Initial Impression / Assessment and Plan / UC Course  I have reviewed the triage vital signs and the nursing notes.  Pertinent labs & imaging results that were available during my care of the patient were reviewed by me and considered in my medical decision making (see chart for details).     Reviewed vitals from last 3 urgent care visits within the month of May and April as well as primary care visit from 08/08/2023.  Her oxygen saturation level appears to be consistent at about 90 to 95% on all visits that were reviewed as well as today. Offered patient treatment with a steroid injection but given recommended disposition to ED patient reports that she will get treatment there and declines steroid injection.  Final Clinical Impressions(s) / UC Diagnoses   Final diagnoses:  Pharyngeal swelling  Neck swelling  Acute sore throat   Patient presents today with concerns for acute sore throat that started yesterday along with chills, body aches and severe pain with swallowing.  She also reports increased saliva production and some drooling.  She reports that she has taken ibuprofen  800 mg to assist with pain but this only provided about 4 hours of relief.  She was recently treated for an abdominal abscess and given a round of doxycycline  followed by round of Bactrim .  She finished the Bactrim  on Monday.  Rapid strep was negative.  At this time physical exam was notable for pharyngeal swelling and decreased visibility of the posterior oropharynx.  The anterior aspect of the neck was also a bit tight  and anatomical landmarks were difficult to palpate.  Patient has oxygen saturation of 92% and respiratory rate of 22.  While this does appear to be her baseline when I reviewed her previous urgent care visits and vitals during the months of May I am concerned that she may be having some increased difficulty breathing due to swelling.  Physical exam is concerning for potential angioedema and I recommended that the patient go to the emergency room for further evaluation given degree of swelling and concern for difficulty managing secretions.  While patient was here I offered a steroid injection to assist with swelling but patient reported that she would prefer to get medication and treatment from the emergency room.  The patient was agreeable with recommendation and declined EMS transportation stating that she would go via private vehicle as her son was accompanying her and could drive her.    Discharge Instructions      Your strep testing was negative.  At this time I am concerned that you may be having a severe condition called angioedema. Given the fact that you are having difficulty swallowing and report increased saliva production I am concerned that you may be developing some difficulty with managing your saliva and airway. This will need to be managed in the ED with medications and observation.  You have declined transportation by EMS so I recommend that you go to the nearest emergency room immediately for further evaluation and ongoing management.  If any point you feel like you cannot make it to the emergency room please pull over and call 911 for further assistance.   ED Prescriptions   None    PDMP not reviewed this encounter.   Whitney Mooring, PA-C 10/22/23 1113

## 2023-10-24 DIAGNOSIS — Z5989 Other problems related to housing and economic circumstances: Secondary | ICD-10-CM | POA: Diagnosis not present

## 2023-10-24 DIAGNOSIS — F411 Generalized anxiety disorder: Secondary | ICD-10-CM | POA: Diagnosis not present

## 2023-10-29 DIAGNOSIS — F411 Generalized anxiety disorder: Secondary | ICD-10-CM | POA: Diagnosis not present

## 2023-11-06 ENCOUNTER — Other Ambulatory Visit: Payer: Self-pay | Admitting: Internal Medicine

## 2023-11-06 DIAGNOSIS — E1169 Type 2 diabetes mellitus with other specified complication: Secondary | ICD-10-CM

## 2023-11-07 DIAGNOSIS — Z630 Problems in relationship with spouse or partner: Secondary | ICD-10-CM | POA: Diagnosis not present

## 2023-11-07 DIAGNOSIS — F411 Generalized anxiety disorder: Secondary | ICD-10-CM | POA: Diagnosis not present

## 2023-11-14 DIAGNOSIS — Z630 Problems in relationship with spouse or partner: Secondary | ICD-10-CM | POA: Diagnosis not present

## 2023-11-14 DIAGNOSIS — F411 Generalized anxiety disorder: Secondary | ICD-10-CM | POA: Diagnosis not present

## 2023-11-21 DIAGNOSIS — Z630 Problems in relationship with spouse or partner: Secondary | ICD-10-CM | POA: Diagnosis not present

## 2023-11-21 DIAGNOSIS — F411 Generalized anxiety disorder: Secondary | ICD-10-CM | POA: Diagnosis not present

## 2023-12-04 DIAGNOSIS — F411 Generalized anxiety disorder: Secondary | ICD-10-CM | POA: Diagnosis not present

## 2023-12-12 ENCOUNTER — Ambulatory Visit: Admitting: Internal Medicine

## 2023-12-12 DIAGNOSIS — Z630 Problems in relationship with spouse or partner: Secondary | ICD-10-CM | POA: Diagnosis not present

## 2023-12-12 DIAGNOSIS — F411 Generalized anxiety disorder: Secondary | ICD-10-CM | POA: Diagnosis not present

## 2023-12-16 DIAGNOSIS — Z630 Problems in relationship with spouse or partner: Secondary | ICD-10-CM | POA: Diagnosis not present

## 2023-12-16 DIAGNOSIS — F411 Generalized anxiety disorder: Secondary | ICD-10-CM | POA: Diagnosis not present

## 2023-12-19 DIAGNOSIS — F411 Generalized anxiety disorder: Secondary | ICD-10-CM | POA: Diagnosis not present

## 2023-12-19 DIAGNOSIS — R4581 Low self-esteem: Secondary | ICD-10-CM | POA: Diagnosis not present

## 2023-12-26 DIAGNOSIS — Z7389 Other problems related to life management difficulty: Secondary | ICD-10-CM | POA: Diagnosis not present

## 2023-12-26 DIAGNOSIS — F411 Generalized anxiety disorder: Secondary | ICD-10-CM | POA: Diagnosis not present

## 2024-01-01 DIAGNOSIS — F411 Generalized anxiety disorder: Secondary | ICD-10-CM | POA: Diagnosis not present

## 2024-01-01 DIAGNOSIS — Z630 Problems in relationship with spouse or partner: Secondary | ICD-10-CM | POA: Diagnosis not present

## 2024-01-16 DIAGNOSIS — Z630 Problems in relationship with spouse or partner: Secondary | ICD-10-CM | POA: Diagnosis not present

## 2024-01-16 DIAGNOSIS — F411 Generalized anxiety disorder: Secondary | ICD-10-CM | POA: Diagnosis not present

## 2024-01-23 DIAGNOSIS — F411 Generalized anxiety disorder: Secondary | ICD-10-CM | POA: Diagnosis not present

## 2024-01-27 ENCOUNTER — Telehealth: Payer: Self-pay | Admitting: Internal Medicine

## 2024-01-27 NOTE — Telephone Encounter (Signed)
 Called patient to confirm upcoming appointment 01/30/2024, no answer. Unable to leave VM.

## 2024-01-30 ENCOUNTER — Ambulatory Visit: Admitting: Internal Medicine

## 2024-01-30 DIAGNOSIS — R4581 Low self-esteem: Secondary | ICD-10-CM | POA: Diagnosis not present

## 2024-01-30 DIAGNOSIS — F411 Generalized anxiety disorder: Secondary | ICD-10-CM | POA: Diagnosis not present

## 2024-02-06 DIAGNOSIS — F411 Generalized anxiety disorder: Secondary | ICD-10-CM | POA: Diagnosis not present

## 2024-02-13 DIAGNOSIS — F411 Generalized anxiety disorder: Secondary | ICD-10-CM | POA: Diagnosis not present

## 2024-02-20 DIAGNOSIS — F411 Generalized anxiety disorder: Secondary | ICD-10-CM | POA: Diagnosis not present

## 2024-02-27 DIAGNOSIS — Z62820 Parent-biological child conflict: Secondary | ICD-10-CM | POA: Diagnosis not present

## 2024-02-27 DIAGNOSIS — F411 Generalized anxiety disorder: Secondary | ICD-10-CM | POA: Diagnosis not present

## 2024-03-04 ENCOUNTER — Ambulatory Visit: Admission: RE | Admit: 2024-03-04 | Discharge: 2024-03-04 | Disposition: A | Discharge status: Home / Self Care

## 2024-03-04 VITALS — BP 112/77 | HR 83 | Temp 98.2°F | Resp 22 | Ht 62.0 in | Wt 280.0 lb

## 2024-03-04 DIAGNOSIS — B349 Viral infection, unspecified: Secondary | ICD-10-CM

## 2024-03-04 LAB — POC COVID19/FLU A&B COMBO
Covid Antigen, POC: NEGATIVE
Influenza A Antigen, POC: NEGATIVE
Influenza B Antigen, POC: NEGATIVE

## 2024-03-04 MED ORDER — PREDNISONE 20 MG PO TABS: 40.0000 mg | ORAL_TABLET | Freq: Every day | ORAL | 0 refills | Status: AC

## 2024-03-04 MED ORDER — PROMETHAZINE-DM 6.25-15 MG/5ML PO SYRP: 10.0000 mL | ORAL_SOLUTION | Freq: Three times a day (TID) | ORAL | 0 refills | Status: AC | PRN

## 2024-03-04 MED ORDER — AZELASTINE HCL 0.1 % NA SOLN: 1.0000 | Freq: Two times a day (BID) | NASAL | 1 refills | Status: AC

## 2024-03-04 NOTE — ED Provider Notes (Signed)
 [facility]  Note:  This document was prepared using Dragon voice recognition software and may include unintentional dictation errors.  MRN: [id number] DOB: 1975/07/01  Subjective:   Whitney White is a 48 y.o. female presenting for nasal congestion, runny nose, chills, cough, chest congestion since Monday.  Patient reports she has been taking over-the-counter cough syrup with minimal improvement to symptoms.  No known sick contacts.  No fever, known shortness of breath, chest pain, weakness, dizziness.  No current facility-administered medications for this encounter.  Current Outpatient Medications:    azelastine (ASTELIN) 0.1 % nasal spray, Place 1 spray into both nostrils 2 (two) times daily. Use in each nostril as directed, Disp: 30 mL, Rfl: 1   cyclobenzaprine  (FLEXERIL ) 10 MG tablet, Take 10 mg by mouth 2 (two) times daily., Disp: , Rfl:    naproxen  (NAPROSYN ) 500 MG tablet, Take 500 mg by mouth as directed., Disp: , Rfl:    predniSONE  (DELTASONE ) 20 MG tablet, Take 2 tablets (40 mg total) by mouth daily for 5 days., Disp: 10 tablet, Rfl: 0   promethazine -dextromethorphan (PROMETHAZINE -DM) 6.25-15 MG/5ML syrup, Take 10 mLs by mouth 3 (three) times daily as needed., Disp: 240 mL, Rfl: 0   UNKNOWN TO PATIENT, OTC Cough Medication, Disp: , Rfl:    albuterol  (VENTOLIN  HFA) 108 (90 Base) MCG/ACT inhaler, Inhale 1-2 puffs into the lungs every 6 (six) hours as needed for wheezing or shortness of breath., Disp: 8 g, Rfl: 0   amLODipine  (NORVASC ) 10 MG tablet, Take 1 tablet (10 mg total) by mouth daily., Disp: 90 tablet, Rfl: 1   atorvastatin  (LIPITOR) 40 MG tablet, Take 1 tablet (40 mg total) by mouth daily., Disp: 90 tablet, Rfl: 1   Blood Glucose Monitoring Suppl (ACCU-CHEK GUIDE ME) w/Device KIT, UAD, Disp: 1 kit, Rfl: 0   busPIRone (BUSPAR) 15 MG tablet, Take 15 mg by mouth 2 (two) times daily., Disp: , Rfl:    empagliflozin  (JARDIANCE ) 25 MG TABS tablet, Take 1 tablet  (25 mg total) by mouth daily before breakfast., Disp: 90 tablet, Rfl: 1   escitalopram  (LEXAPRO ) 20 MG tablet, Take 1 tablet (20 mg) by mouth daily., Disp: 30 tablet, Rfl: 6   gabapentin  (NEURONTIN ) 300 MG capsule, Take 1 capsule (300 mg total) by mouth at bedtime., Disp: 90 capsule, Rfl: 3   glucose blood (ACCU-CHEK GUIDE) test strip, Check BS once a day, Disp: 100 each, Rfl: 12   hydrochlorothiazide  (HYDRODIURIL ) 25 MG tablet, Take 1 tablet (25 mg total) by mouth daily., Disp: 90 tablet, Rfl: 1   hydrOXYzine  (ATARAX /VISTARIL ) 25 MG tablet, Take 12.5 mg by mouth 2 (two) times daily as needed., Disp: , Rfl:    ibuprofen  (ADVIL ) 800 MG tablet, Take 1 tablet (800 mg total) by mouth every 8 (eight) hours as needed (pain)., Disp: 21 tablet, Rfl: 0   Lancets Misc. (ACCU-CHEK FASTCLIX LANCET) KIT, UAD, Disp: 100 kit, Rfl: 3   levonorgestrel  (MIRENA ) 20 MCG/24HR IUD, 1 each by Intrauterine route once., Disp: , Rfl:    Semaglutide ,0.25 or 0.5MG /DOS, (OZEMPIC , 0.25 OR 0.5 MG/DOSE,) 2 MG/3ML SOPN, INJECT 0.5 MG INTO THE SKIN ONE TIME PER WEEK, Disp: 3 mL, Rfl: 1   Spacer/Aero-Holding Chambers DEVI, 1 Units by Does not apply route in the morning, at noon, and at bedtime., Disp: 1 Units, Rfl: 0   triamcinolone  (KENALOG ) 0.025 % ointment, APPLY TO AFFECTED AREA TWICE A DAY, Disp: 60 g, Rfl: 0   TRUEplus Lancets 28G MISC, Use as directed,  Disp: 100 each, Rfl: 5   Allergies  Allergen Reactions   Lisinopril  Cough   Metformin  And Related     Caused palpitations/heart pounding    Past Medical History:  Diagnosis Date   Depression    Diabetes mellitus without complication (HCC)    pre-diabetic   Eczema    GERD (gastroesophageal reflux disease)    Gestational hypertension    Hypertension      Past Surgical History:  Procedure Laterality Date   NO PAST SURGERIES      Family History  Problem Relation Age of Onset   Hypertension Mother    Stroke Mother    Diabetes Maternal Grandmother     Hypertension Maternal Grandmother    Cancer Maternal Aunt        breast cancer    Anesthesia problems Neg Hx     Social History   Tobacco Use   Smoking status: Former    Types: Cigarettes   Smokeless tobacco: Never  Vaping Use   Vaping status: Never Used  Substance Use Topics   Alcohol use: Yes    Comment: occaisonally   Drug use: No    ROS Refer to HPI for ROS details.  Objective:   Vitals: BP 112/77 (BP Location: Left Arm)   Pulse 83   Temp 98.2 F (36.8 C) (Oral)   Resp (!) 22   Ht 5' 2 (1.575 m)   Wt 280 lb (127 kg)   SpO2 94%   BMI 51.21 kg/m   Physical Exam Vitals and nursing note reviewed.  Constitutional:      General: She is not in acute distress.    Appearance: She is well-developed. She is not ill-appearing or toxic-appearing.  HENT:     Head: Normocephalic and atraumatic.     Nose: Congestion and rhinorrhea present.     Mouth/Throat:     Mouth: Mucous membranes are moist.     Pharynx: Oropharynx is clear. No posterior oropharyngeal erythema.  Cardiovascular:     Rate and Rhythm: Normal rate.  Pulmonary:     Effort: Pulmonary effort is normal. No respiratory distress.     Breath sounds: No stridor. No wheezing.  Skin:    General: Skin is warm and dry.  Neurological:     General: No focal deficit present.     Mental Status: She is alert and oriented to person, place, and time.  Psychiatric:        Mood and Affect: Mood normal.        Behavior: Behavior normal.     Procedures  Results for orders placed or performed during the hospital encounter of 03/04/24 (from the past 24 hours)  POC Covid19/Flu A&B Antigen     Status: Normal   Collection Time: 03/04/24  6:47 PM  Result Value Ref Range   Influenza A Antigen, POC Negative Negative   Influenza B Antigen, POC Negative Negative   Covid Antigen, POC Negative Negative    No results found.   Assessment and Plan :     Discharge Instructions       1. Acute viral syndrome  (Primary) - POC Covid19/Flu A&B Antigen completed in UC is negative for COVID and influenza - azelastine (ASTELIN) 0.1 % nasal spray; Place 1 spray into both nostrils 2 (two) times daily. Use in each nostril as directed  Dispense: 30 mL; Refill: 1 - promethazine -dextromethorphan (PROMETHAZINE -DM) 6.25-15 MG/5ML syrup; Take 10 mLs by mouth 3 (three) times daily as needed.  Dispense: 240 mL;  Refill: 0 - predniSONE  (DELTASONE ) 20 MG tablet; Take 2 tablets (40 mg total) by mouth daily for 5 days.  Dispense: 10 tablet; Refill: 0 -Continue to monitor symptoms for any change in severity if there is any escalation of current symptoms or development of new symptoms follow-up in ER for further evaluation and management.      Chaitra Mast B Paras Kreider   Majed Pellegrin, [place] B, [place] 03/04/24 1952

## 2024-03-04 NOTE — ED Triage Notes (Signed)
 The patient reports symptom onset on Monday upon waking, initially presenting with a sore throat and general malaise. By Tuesday, symptoms progressed to include nasal congestion and a runny nose persisting throughout the day. Yesterday, additional symptoms developed, including chills, cough, and increased mucus production in the chest. The patient took 5 mL of over-the-counter cough syrup before bedtime last night with minimal improvement noted. No fever has been reported.

## 2024-03-04 NOTE — Discharge Instructions (Addendum)
  1. Acute viral syndrome (Primary) - POC Covid19/Flu A&B Antigen completed in UC is negative for COVID and influenza - azelastine (ASTELIN) 0.1 % nasal spray; Place 1 spray into both nostrils 2 (two) times daily. Use in each nostril as directed  Dispense: 30 mL; Refill: 1 - promethazine -dextromethorphan (PROMETHAZINE -DM) 6.25-15 MG/5ML syrup; Take 10 mLs by mouth 3 (three) times daily as needed.  Dispense: 240 mL; Refill: 0 - predniSONE  (DELTASONE ) 20 MG tablet; Take 2 tablets (40 mg total) by mouth daily for 5 days.  Dispense: 10 tablet; Refill: 0 -Continue to monitor symptoms for any change in severity if there is any escalation of current symptoms or development of new symptoms follow-up in ER for further evaluation and management.

## 2024-03-17 ENCOUNTER — Other Ambulatory Visit: Payer: Self-pay | Admitting: Internal Medicine

## 2024-03-17 DIAGNOSIS — I152 Hypertension secondary to endocrine disorders: Secondary | ICD-10-CM

## 2024-03-17 DIAGNOSIS — E1169 Type 2 diabetes mellitus with other specified complication: Secondary | ICD-10-CM

## 2024-03-18 DIAGNOSIS — R4581 Low self-esteem: Secondary | ICD-10-CM | POA: Diagnosis not present

## 2024-03-18 DIAGNOSIS — F411 Generalized anxiety disorder: Secondary | ICD-10-CM | POA: Diagnosis not present

## 2024-03-18 DIAGNOSIS — Z62820 Parent-biological child conflict: Secondary | ICD-10-CM | POA: Diagnosis not present

## 2024-03-26 DIAGNOSIS — F411 Generalized anxiety disorder: Secondary | ICD-10-CM | POA: Diagnosis not present

## 2024-03-26 DIAGNOSIS — R4581 Low self-esteem: Secondary | ICD-10-CM | POA: Diagnosis not present

## 2024-04-02 DIAGNOSIS — F411 Generalized anxiety disorder: Secondary | ICD-10-CM | POA: Diagnosis not present

## 2024-04-02 DIAGNOSIS — Z62820 Parent-biological child conflict: Secondary | ICD-10-CM | POA: Diagnosis not present

## 2024-04-02 DIAGNOSIS — R4581 Low self-esteem: Secondary | ICD-10-CM | POA: Diagnosis not present

## 2024-04-07 DIAGNOSIS — F411 Generalized anxiety disorder: Secondary | ICD-10-CM | POA: Diagnosis not present

## 2024-04-07 DIAGNOSIS — R4581 Low self-esteem: Secondary | ICD-10-CM | POA: Diagnosis not present

## 2024-04-11 ENCOUNTER — Other Ambulatory Visit: Payer: Self-pay | Admitting: Internal Medicine

## 2024-04-11 DIAGNOSIS — E1169 Type 2 diabetes mellitus with other specified complication: Secondary | ICD-10-CM

## 2024-04-11 DIAGNOSIS — I152 Hypertension secondary to endocrine disorders: Secondary | ICD-10-CM

## 2024-04-13 NOTE — Telephone Encounter (Signed)
 Requested medications are due for refill today.  yes  Requested medications are on the active medications list.  yes  Last refill. 03/17/2024 #30 0 rf for both  Future visit scheduled.   no  Notes to clinic.  Labs are expired.    Requested Prescriptions  Pending Prescriptions Disp Refills   atorvastatin  (LIPITOR) 40 MG tablet [Pharmacy Med Name: ATORVASTATIN  40 MG TABLET] 90 tablet 1    Sig: TAKE 1 TABLET BY MOUTH EVERY DAY     Cardiovascular:  Antilipid - Statins Failed - 04/13/2024 11:03 AM      Failed - Lipid Panel in normal range within the last 12 months    Cholesterol, Total  Date Value Ref Range Status  12/30/2022 228 (H) 100 - 199 mg/dL Final   LDL Chol Calc (NIH)  Date Value Ref Range Status  12/30/2022 152 (H) 0 - 99 mg/dL Final   HDL  Date Value Ref Range Status  12/30/2022 57 >39 mg/dL Final   Triglycerides  Date Value Ref Range Status  12/30/2022 106 0 - 149 mg/dL Final         Passed - Patient is not pregnant      Passed - Valid encounter within last 12 months    Recent Outpatient Visits           8 months ago Type 2 diabetes mellitus with morbid obesity (HCC)   [facility] [place]. [facility] Vicci Sober B, MD   1 year ago Type 2 diabetes mellitus with morbid obesity [facility])   [facility] [place]. [facility] Vicci Sober NOVAK, MD   1 year ago Periodic limb movement   [facility] [place]. [facility] [place], [place]y, FNP   2 years ago Type 2 diabetes mellitus with morbid obesity [facility])   [facility] [place]. [facility] [place], [place] W, NP   2 years ago Type 2 diabetes mellitus with morbid obesity [facility])   [facility] [place]. [facility] Vicci Sober NOVAK, MD               amLODipine  (NORVASC ) 10 MG tablet [Pharmacy Med  Name: AMLODIPINE  BESYLATE 10 MG TAB] 90 tablet 1    Sig: TAKE 1 TABLET BY MOUTH EVERY DAY     Cardiovascular: Calcium  Channel Blockers 2 Failed - 04/13/2024 11:03 AM      Failed - Valid encounter within last 6 months    Recent Outpatient Visits           8 months ago Type 2 diabetes mellitus with morbid obesity (HCC)   [facility] [place]. [facility] Vicci Sober B, MD   1 year ago Type 2 diabetes mellitus with morbid obesity [facility])   [facility] Of [place]. [facility] Vicci Sober NOVAK, MD   1 year ago Periodic limb movement   [facility] Of [place]. [facility] [place], Fairy, FNP   2 years ago Type 2 diabetes mellitus with morbid obesity [facility])   [facility] Of [place]. [facility] Theotis Haze ORN, NP   2 years ago  Type 2 diabetes mellitus with morbid obesity (HCC)   [facility] Of . [facility] Vicci Barnie NOVAK, MD              Passed - Last BP in normal range    BP Readings from Last 1 Encounters:  03/04/24 112/77         Passed - Last Heart Rate in normal range    Pulse Readings from Last 1 Encounters:  03/04/24 83

## 2024-04-16 DIAGNOSIS — F411 Generalized anxiety disorder: Secondary | ICD-10-CM | POA: Diagnosis not present

## 2024-04-16 DIAGNOSIS — R4581 Low self-esteem: Secondary | ICD-10-CM | POA: Diagnosis not present

## 2024-04-16 DIAGNOSIS — F338 Other recurrent depressive disorders: Secondary | ICD-10-CM | POA: Diagnosis not present

## 2024-04-23 DIAGNOSIS — Z62820 Parent-biological child conflict: Secondary | ICD-10-CM | POA: Diagnosis not present

## 2024-04-23 DIAGNOSIS — R4581 Low self-esteem: Secondary | ICD-10-CM | POA: Diagnosis not present

## 2024-04-23 DIAGNOSIS — F411 Generalized anxiety disorder: Secondary | ICD-10-CM | POA: Diagnosis not present

## 2024-05-07 DIAGNOSIS — Z62820 Parent-biological child conflict: Secondary | ICD-10-CM | POA: Diagnosis not present

## 2024-05-07 DIAGNOSIS — F411 Generalized anxiety disorder: Secondary | ICD-10-CM | POA: Diagnosis not present

## 2024-05-11 ENCOUNTER — Other Ambulatory Visit: Payer: Self-pay | Admitting: Internal Medicine

## 2024-05-11 DIAGNOSIS — I152 Hypertension secondary to endocrine disorders: Secondary | ICD-10-CM

## 2024-05-11 DIAGNOSIS — E1169 Type 2 diabetes mellitus with other specified complication: Secondary | ICD-10-CM

## 2024-05-13 DIAGNOSIS — Z62820 Parent-biological child conflict: Secondary | ICD-10-CM | POA: Diagnosis not present

## 2024-05-13 DIAGNOSIS — F411 Generalized anxiety disorder: Secondary | ICD-10-CM | POA: Diagnosis not present

## 2024-05-19 ENCOUNTER — Other Ambulatory Visit: Payer: Self-pay | Admitting: Internal Medicine

## 2024-05-19 DIAGNOSIS — I152 Hypertension secondary to endocrine disorders: Secondary | ICD-10-CM

## 2024-05-25 DIAGNOSIS — F411 Generalized anxiety disorder: Secondary | ICD-10-CM | POA: Diagnosis not present

## 2024-05-25 DIAGNOSIS — Z62820 Parent-biological child conflict: Secondary | ICD-10-CM | POA: Diagnosis not present

## 2024-06-12 ENCOUNTER — Other Ambulatory Visit: Payer: Self-pay | Admitting: Internal Medicine

## 2024-06-12 DIAGNOSIS — E1159 Type 2 diabetes mellitus with other circulatory complications: Secondary | ICD-10-CM

## 2024-06-15 ENCOUNTER — Other Ambulatory Visit: Payer: Self-pay | Admitting: Internal Medicine

## 2024-06-15 DIAGNOSIS — E1159 Type 2 diabetes mellitus with other circulatory complications: Secondary | ICD-10-CM

## 2024-06-17 ENCOUNTER — Other Ambulatory Visit: Payer: Self-pay | Admitting: Internal Medicine

## 2024-06-17 ENCOUNTER — Encounter: Payer: Self-pay | Admitting: Internal Medicine

## 2024-06-17 DIAGNOSIS — E1169 Type 2 diabetes mellitus with other specified complication: Secondary | ICD-10-CM

## 2024-06-17 DIAGNOSIS — I152 Hypertension secondary to endocrine disorders: Secondary | ICD-10-CM

## 2024-06-17 MED ORDER — EMPAGLIFLOZIN 25 MG PO TABS: 25.0000 mg | ORAL_TABLET | Freq: Every day | ORAL | 0 refills | Status: AC

## 2024-06-17 MED ORDER — HYDROCHLOROTHIAZIDE 25 MG PO TABS: 25.0000 mg | ORAL_TABLET | Freq: Every day | ORAL | 0 refills | Status: AC

## 2024-07-16 ENCOUNTER — Ambulatory Visit: Admitting: Internal Medicine
